# Patient Record
Sex: Male | Born: 1945
Health system: Southern US, Community
[De-identification: ages and names within clinical notes are randomized; demographics above are authoritative.]

## PROBLEM LIST (undated history)

## (undated) DIAGNOSIS — J449 Chronic obstructive pulmonary disease, unspecified: Secondary | ICD-10-CM

## (undated) DIAGNOSIS — G894 Chronic pain syndrome: Secondary | ICD-10-CM

## (undated) DIAGNOSIS — K219 Gastro-esophageal reflux disease without esophagitis: Secondary | ICD-10-CM

## (undated) DIAGNOSIS — I10 Essential (primary) hypertension: Secondary | ICD-10-CM

## (undated) DIAGNOSIS — E119 Type 2 diabetes mellitus without complications: Secondary | ICD-10-CM

## (undated) DIAGNOSIS — M1990 Unspecified osteoarthritis, unspecified site: Secondary | ICD-10-CM

## (undated) DIAGNOSIS — G4733 Obstructive sleep apnea (adult) (pediatric): Secondary | ICD-10-CM

## (undated) DIAGNOSIS — G629 Polyneuropathy, unspecified: Secondary | ICD-10-CM

## (undated) DIAGNOSIS — I700 Atherosclerosis of aorta: Secondary | ICD-10-CM

## (undated) DIAGNOSIS — I2510 Atherosclerotic heart disease of native coronary artery without angina pectoris: Secondary | ICD-10-CM

## (undated) DIAGNOSIS — F419 Anxiety disorder, unspecified: Secondary | ICD-10-CM

## (undated) DIAGNOSIS — F32A Depression, unspecified: Secondary | ICD-10-CM

## (undated) DIAGNOSIS — I444 Left anterior fascicular block: Secondary | ICD-10-CM

## (undated) DIAGNOSIS — K429 Umbilical hernia without obstruction or gangrene: Secondary | ICD-10-CM

## (undated) DIAGNOSIS — E785 Hyperlipidemia, unspecified: Secondary | ICD-10-CM

## (undated) DIAGNOSIS — K760 Fatty (change of) liver, not elsewhere classified: Secondary | ICD-10-CM

## (undated) DIAGNOSIS — I517 Cardiomegaly: Secondary | ICD-10-CM

## (undated) DIAGNOSIS — K449 Diaphragmatic hernia without obstruction or gangrene: Secondary | ICD-10-CM

## (undated) DIAGNOSIS — E1140 Type 2 diabetes mellitus with diabetic neuropathy, unspecified: Secondary | ICD-10-CM

## (undated) DIAGNOSIS — N183 Chronic kidney disease, stage 3 unspecified: Secondary | ICD-10-CM

## (undated) HISTORY — PX: HERNIA REPAIR: SHX51

## (undated) HISTORY — PX: CORONARY ANGIOPLASTY WITH STENT PLACEMENT: SHX49

## (undated) HISTORY — PX: APPENDECTOMY: SHX54

## (undated) HISTORY — PX: AMPUTATION TOE: SHX6595

---

## 2000-12-29 DIAGNOSIS — Z955 Presence of coronary angioplasty implant and graft: Secondary | ICD-10-CM

## 2000-12-29 HISTORY — DX: Presence of coronary angioplasty implant and graft: Z95.5

## 2017-11-16 DIAGNOSIS — R413 Other amnesia: Secondary | ICD-10-CM | POA: Insufficient documentation

## 2018-01-27 ENCOUNTER — Other Ambulatory Visit: Payer: Self-pay

## 2018-01-27 ENCOUNTER — Emergency Department
Admission: EM | Admit: 2018-01-27 | Discharge: 2018-01-27 | Disposition: A | Discharge status: Home / Self Care | Payer: Medicare HMO | Attending: Emergency Medicine | Admitting: Emergency Medicine

## 2018-01-27 DIAGNOSIS — Z760 Encounter for issue of repeat prescription: Secondary | ICD-10-CM

## 2018-01-27 DIAGNOSIS — I10 Essential (primary) hypertension: Secondary | ICD-10-CM | POA: Insufficient documentation

## 2018-01-27 DIAGNOSIS — G63 Polyneuropathy in diseases classified elsewhere: Secondary | ICD-10-CM | POA: Diagnosis not present

## 2018-01-27 DIAGNOSIS — E1143 Type 2 diabetes mellitus with diabetic autonomic (poly)neuropathy: Secondary | ICD-10-CM | POA: Insufficient documentation

## 2018-01-27 HISTORY — DX: Essential (primary) hypertension: I10

## 2018-01-27 HISTORY — DX: Polyneuropathy, unspecified: G62.9

## 2018-01-27 HISTORY — DX: Type 2 diabetes mellitus without complications: E11.9

## 2018-01-27 MED ORDER — OXYCODONE-ACETAMINOPHEN 5-325 MG PO TABS: 1.0000 | ORAL_TABLET | Freq: Four times a day (QID) | ORAL | 0 refills | Status: AC | PRN

## 2018-01-27 MED ORDER — GABAPENTIN 600 MG PO TABS: 1200.0000 mg | ORAL_TABLET | Freq: Three times a day (TID) | ORAL | 0 refills | Status: DC

## 2018-01-27 NOTE — ED Provider Notes (Signed)
[facility] Emergency Department Provider Note ____________________________________________  Time seen: 1205  I have reviewed the triage vital signs and the nursing notes.  HISTORY  Chief Complaint  Medication Refill  HPI Chris Mercer is a 72 y.o. male presents himself to the ED for a medicine refill request.  Patient with a history of diabetic neuropathy, presents with a request of a refill of his gabapentin.  Patient reports that he had been evaluated by Dr. Ginette Pitman, and is in the process of being referred to pain management. He was expecting a refill to be called in, but the pharmacy has no current prescriptions. He attempted to be seen this morning at [facility], but reportedly was asked to leave.   I have phoned the [facility] on [facility], they have no current prescriptions for him. The chart from [facility] notes that a prescription was to be called in for his gabapentin.  Past Medical History:  Diagnosis Date  . Diabetes mellitus without complication ([place])   . Hypertension   . Neuropathy     There are no active problems to display for this patient.   Past Surgical History:  Procedure Laterality Date  . APPENDECTOMY    . CORONARY ANGIOPLASTY WITH STENT PLACEMENT    . HERNIA REPAIR     x3    Prior to Admission medications   Medication Sig Start Date End Date Taking? Authorizing Provider  gabapentin (NEURONTIN) 600 MG tablet Take 2 tablets (1,200 mg total) by mouth 3 (three) times daily. 01/27/18 02/26/18  Theo Reither, Dannielle Karvonen, PA-C  oxyCODONE-acetaminophen (PERCOCET) 5-325 MG tablet Take 1 tablet by mouth every 6 (six) hours as needed for up to 7 days for severe pain. 01/27/18 02/03/18  Mandela Bello, Dannielle Karvonen, PA-C    Allergies Azithromycin  No family history on file.  Social History Social History   Tobacco Use  . Smoking status: Never Smoker  . Smokeless tobacco: Never Used  Substance Use Topics  . Alcohol use: No    Frequency:  Never  . Drug use: Not on file    Review of Systems  Constitutional: Negative for fever. Cardiovascular: Negative for chest pain. Respiratory: Negative for shortness of breath. Gastrointestinal: Negative for abdominal pain, vomiting and diarrhea. Genitourinary: Negative for dysuria. Musculoskeletal: Negative for back pain. Skin: Negative for rash. Neurological: Negative for headaches, focal weakness or numbness.  Chronic neuropathic pain as noted. ____________________________________________  PHYSICAL EXAM:  VITAL SIGNS: ED Triage Vitals  Enc Vitals Group     BP 01/27/18 1120 (!) 165/98     Pulse Rate 01/27/18 1120 90     Resp 01/27/18 1120 18     Temp 01/27/18 1120 97.6 F (36.4 C)     Temp Source 01/27/18 1120 Oral     SpO2 01/27/18 1120 100 %     Weight 01/27/18 1126 195 lb (88.5 kg)     Height 01/27/18 1126 6' (1.829 m)     Head Circumference --      Peak Flow --      Pain Score 01/27/18 1126 6     Pain Loc --      Pain Edu? --      Excl. in [place]? --     Constitutional: Alert and oriented. Well appearing and in no distress. Head: Normocephalic and atraumatic. Eyes: Conjunctivae are normal.  Normal extraocular movements Cardiovascular: Normal rate, regular rhythm. Normal distal pulses. Respiratory: Normal respiratory effort. No wheezes/rales/rhonchi. Musculoskeletal: Nontender with normal range of motion in  all extremities.  Neurologic:  Normal gait without ataxia. Normal speech and language. No gross focal neurologic deficits are appreciated. Skin:  Skin is warm, dry and intact. No rash noted. Psychiatric: Mood was despondent and affect was indignant. Patient exhibits appropriate insight and judgment. ____________________________________________  INITIAL IMPRESSION / ASSESSMENT AND PLAN / ED COURSE  Patient with ED evaluation and request for prescription refill.  The patient is in the process of being transferred to pain management versus neurology for management  and treatment of his chronic neuropathic pain.  He is provided with a prescription for gabapentin and a small prescription of Percocet here in the ED.  He is further advised to have an appointment on Monday with neurology for further management of his symptoms.  I also made contact on the patient's behalf, and with his permission with the hospital social worker.  She will make contact via phone with the patient after his discharge.  Patient requested some assistance with his current assisted living situation and potentially could benefit from some grief counseling.  I reviewed the patient's prescription history over the last 12 months in the multi-state controlled substances database(s) that includes [place], [place], [place], [place], [place], [place], [place], [place], [place], [place], [place], [place], [place], and [place].  Results were notable for no current narcotic prescriptions.  ____________________________________________  FINAL CLINICAL IMPRESSION(S) / ED DIAGNOSES  Final diagnoses:  Medication refill  Polyneuropathy associated with underlying disease [facility])      Carmie End, Dannielle Karvonen, PA-C 01/27/18 1910    Orbie Pyo, MD 01/29/18 [phone]

## 2018-01-27 NOTE — Discharge Instructions (Signed)
You have been provided a refill of your home maintenance medicines. You must follow-up with neurology for ongoing management.

## 2018-01-27 NOTE — ED Triage Notes (Signed)
Pt states he needs a refill on his gabapentin for neuropathy pain and has the empty bottle with him that says he has 3 refills remaining but [facility] [place] and states the [facility] here will not honor it.Marland Kitchen

## 2018-01-27 NOTE — ED Notes (Signed)
Pt is here for a prescription of Gabapentin. Pt states he was given a RX for cymbalta to which he is allergic too. Pt states Dr. Alvy Bimler will not return his calls and only speaks to his son. Pt is interested in getting RX for gabapentin 600 mg. Pt is ambulatory, NAD and awaiting EDP at this time.

## 2018-01-27 NOTE — ED Notes (Signed)
First Nurse Note:  Patient states that he is here for pain management.  Unable to have pain medication refilled at physician office.

## 2018-02-02 DIAGNOSIS — R292 Abnormal reflex: Secondary | ICD-10-CM | POA: Insufficient documentation

## 2018-03-09 DIAGNOSIS — R278 Other lack of coordination: Secondary | ICD-10-CM | POA: Insufficient documentation

## 2018-04-29 DIAGNOSIS — Z0000 Encounter for general adult medical examination without abnormal findings: Secondary | ICD-10-CM | POA: Insufficient documentation

## 2019-02-15 ENCOUNTER — Other Ambulatory Visit: Payer: Self-pay | Admitting: Nephrology

## 2019-02-15 DIAGNOSIS — N179 Acute kidney failure, unspecified: Secondary | ICD-10-CM

## 2019-02-15 DIAGNOSIS — N189 Chronic kidney disease, unspecified: Secondary | ICD-10-CM

## 2019-02-21 ENCOUNTER — Ambulatory Visit: Payer: Medicare HMO

## 2019-02-24 ENCOUNTER — Ambulatory Visit: Payer: Self-pay

## 2019-03-16 ENCOUNTER — Encounter: Payer: Self-pay | Admitting: Urology

## 2019-03-16 ENCOUNTER — Ambulatory Visit: Payer: Self-pay | Admitting: Urology

## 2019-03-18 ENCOUNTER — Encounter (HOSPITAL_COMMUNITY): Payer: Self-pay | Admitting: Internal Medicine

## 2019-03-18 ENCOUNTER — Emergency Department (HOSPITAL_COMMUNITY): Payer: Medicare HMO

## 2019-03-18 ENCOUNTER — Inpatient Hospital Stay (HOSPITAL_COMMUNITY)
Admission: EM | Admit: 2019-03-18 | Discharge: 2019-03-23 | DRG: 092 | Disposition: A | Discharge status: Home Health | Payer: Medicare HMO | Attending: Internal Medicine | Admitting: Internal Medicine

## 2019-03-18 ENCOUNTER — Other Ambulatory Visit: Payer: Self-pay

## 2019-03-18 DIAGNOSIS — Z888 Allergy status to other drugs, medicaments and biological substances status: Secondary | ICD-10-CM

## 2019-03-18 DIAGNOSIS — G92 Toxic encephalopathy: Secondary | ICD-10-CM | POA: Diagnosis not present

## 2019-03-18 DIAGNOSIS — W1830XA Fall on same level, unspecified, initial encounter: Secondary | ICD-10-CM | POA: Diagnosis present

## 2019-03-18 DIAGNOSIS — R531 Weakness: Secondary | ICD-10-CM

## 2019-03-18 DIAGNOSIS — E119 Type 2 diabetes mellitus without complications: Secondary | ICD-10-CM

## 2019-03-18 DIAGNOSIS — E860 Dehydration: Secondary | ICD-10-CM | POA: Diagnosis present

## 2019-03-18 DIAGNOSIS — E872 Acidosis: Secondary | ICD-10-CM | POA: Diagnosis present

## 2019-03-18 DIAGNOSIS — T424X5A Adverse effect of benzodiazepines, initial encounter: Secondary | ICD-10-CM | POA: Diagnosis present

## 2019-03-18 DIAGNOSIS — Z781 Physical restraint status: Secondary | ICD-10-CM

## 2019-03-18 DIAGNOSIS — F418 Other specified anxiety disorders: Secondary | ICD-10-CM | POA: Diagnosis present

## 2019-03-18 DIAGNOSIS — R6889 Other general symptoms and signs: Secondary | ICD-10-CM | POA: Diagnosis present

## 2019-03-18 DIAGNOSIS — R339 Retention of urine, unspecified: Secondary | ICD-10-CM | POA: Diagnosis not present

## 2019-03-18 DIAGNOSIS — T50905A Adverse effect of unspecified drugs, medicaments and biological substances, initial encounter: Secondary | ICD-10-CM | POA: Diagnosis present

## 2019-03-18 DIAGNOSIS — R4182 Altered mental status, unspecified: Secondary | ICD-10-CM | POA: Diagnosis present

## 2019-03-18 DIAGNOSIS — F19921 Other psychoactive substance use, unspecified with intoxication with delirium: Secondary | ICD-10-CM

## 2019-03-18 DIAGNOSIS — F32A Depression, unspecified: Secondary | ICD-10-CM | POA: Diagnosis present

## 2019-03-18 DIAGNOSIS — F22 Delusional disorders: Secondary | ICD-10-CM | POA: Diagnosis present

## 2019-03-18 DIAGNOSIS — N179 Acute kidney failure, unspecified: Secondary | ICD-10-CM | POA: Diagnosis present

## 2019-03-18 DIAGNOSIS — G629 Polyneuropathy, unspecified: Secondary | ICD-10-CM

## 2019-03-18 DIAGNOSIS — E1140 Type 2 diabetes mellitus with diabetic neuropathy, unspecified: Secondary | ICD-10-CM | POA: Diagnosis present

## 2019-03-18 DIAGNOSIS — F419 Anxiety disorder, unspecified: Secondary | ICD-10-CM | POA: Diagnosis present

## 2019-03-18 DIAGNOSIS — Z8249 Family history of ischemic heart disease and other diseases of the circulatory system: Secondary | ICD-10-CM

## 2019-03-18 DIAGNOSIS — S80212A Abrasion, left knee, initial encounter: Secondary | ICD-10-CM | POA: Diagnosis present

## 2019-03-18 DIAGNOSIS — S80211A Abrasion, right knee, initial encounter: Secondary | ICD-10-CM | POA: Diagnosis present

## 2019-03-18 DIAGNOSIS — Z881 Allergy status to other antibiotic agents status: Secondary | ICD-10-CM

## 2019-03-18 DIAGNOSIS — E785 Hyperlipidemia, unspecified: Secondary | ICD-10-CM | POA: Diagnosis present

## 2019-03-18 DIAGNOSIS — I10 Essential (primary) hypertension: Secondary | ICD-10-CM

## 2019-03-18 DIAGNOSIS — I152 Hypertension secondary to endocrine disorders: Secondary | ICD-10-CM | POA: Diagnosis present

## 2019-03-18 DIAGNOSIS — L8990 Pressure ulcer of unspecified site, unspecified stage: Secondary | ICD-10-CM

## 2019-03-18 DIAGNOSIS — G894 Chronic pain syndrome: Secondary | ICD-10-CM | POA: Diagnosis present

## 2019-03-18 DIAGNOSIS — R404 Transient alteration of awareness: Secondary | ICD-10-CM

## 2019-03-18 DIAGNOSIS — Z955 Presence of coronary angioplasty implant and graft: Secondary | ICD-10-CM

## 2019-03-18 DIAGNOSIS — F329 Major depressive disorder, single episode, unspecified: Secondary | ICD-10-CM | POA: Diagnosis present

## 2019-03-18 LAB — URINALYSIS, ROUTINE W REFLEX MICROSCOPIC
Bilirubin Urine: NEGATIVE
Glucose, UA: NEGATIVE mg/dL
Hgb urine dipstick: NEGATIVE
KETONES UR: NEGATIVE mg/dL
Leukocytes,Ua: NEGATIVE
NITRITE: NEGATIVE
PROTEIN: NEGATIVE mg/dL
Specific Gravity, Urine: 1.024 (ref 1.005–1.030)
pH: 5 (ref 5.0–8.0)

## 2019-03-18 LAB — COMPREHENSIVE METABOLIC PANEL
ALBUMIN: 3.3 g/dL — AB (ref 3.5–5.0)
ALT: 26 U/L (ref 0–44)
AST: 37 U/L (ref 15–41)
Alkaline Phosphatase: 102 U/L (ref 38–126)
Anion gap: 7 (ref 5–15)
BUN: 26 mg/dL — ABNORMAL HIGH (ref 8–23)
CALCIUM: 9 mg/dL (ref 8.9–10.3)
CO2: 21 mmol/L — ABNORMAL LOW (ref 22–32)
Chloride: 112 mmol/L — ABNORMAL HIGH (ref 98–111)
Creatinine, Ser: 1.3 mg/dL — ABNORMAL HIGH (ref 0.61–1.24)
GFR calc non Af Amer: 55 mL/min — ABNORMAL LOW (ref >60)
Glucose, Bld: 186 mg/dL — ABNORMAL HIGH (ref 70–99)
POTASSIUM: 3.6 mmol/L (ref 3.5–5.1)
SODIUM: 140 mmol/L (ref 135–145)
Total Bilirubin: 0.2 mg/dL — ABNORMAL LOW (ref 0.3–1.2)
Total Protein: 5.9 g/dL — ABNORMAL LOW (ref 6.5–8.1)

## 2019-03-18 LAB — INFLUENZA PANEL BY PCR (TYPE A & B)
INFLAPCR: NEGATIVE
Influenza B By PCR: NEGATIVE

## 2019-03-18 LAB — CBC WITH DIFFERENTIAL/PLATELET
ABS IMMATURE GRANULOCYTES: 0.05 10*3/uL (ref 0.00–0.07)
BASOS ABS: 0 10*3/uL (ref 0.0–0.1)
BASOS PCT: 0 %
EOS ABS: 0.3 10*3/uL (ref 0.0–0.5)
Eosinophils Relative: 3 %
HCT: 35.3 % — ABNORMAL LOW (ref 39.0–52.0)
Hemoglobin: 10.9 g/dL — ABNORMAL LOW (ref 13.0–17.0)
Immature Granulocytes: 1 %
LYMPHS ABS: 1.3 10*3/uL (ref 0.7–4.0)
Lymphocytes Relative: 15 %
MCH: 27.9 pg (ref 26.0–34.0)
MCHC: 30.9 g/dL (ref 30.0–36.0)
MCV: 90.5 fL (ref 80.0–100.0)
Monocytes Absolute: 0.6 10*3/uL (ref 0.1–1.0)
Monocytes Relative: 6 %
NEUTROS ABS: 6.6 10*3/uL (ref 1.7–7.7)
NEUTROS PCT: 75 %
NRBC: 0 % (ref 0.0–0.2)
PLATELETS: 197 10*3/uL (ref 150–400)
RBC: 3.9 MIL/uL — ABNORMAL LOW (ref 4.22–5.81)
RDW: 14.5 % (ref 11.5–15.5)
WBC: 8.8 10*3/uL (ref 4.0–10.5)

## 2019-03-18 LAB — ETHANOL

## 2019-03-18 LAB — LACTIC ACID, PLASMA
LACTIC ACID, VENOUS: 2.6 mmol/L — AB (ref 0.5–1.9)
LACTIC ACID, VENOUS: 1.6 mmol/L (ref 0.5–1.9)

## 2019-03-18 LAB — TSH: TSH: 2.244 u[IU]/mL (ref 0.350–4.500)

## 2019-03-18 MED ORDER — SODIUM CHLORIDE 0.9 % IV BOLUS
500.0000 mL | Freq: Once | INTRAVENOUS | Status: AC
  Administered 2019-03-18: 500 mL via INTRAVENOUS

## 2019-03-18 MED ORDER — ACETAMINOPHEN 650 MG RE SUPP: 650.0000 mg | Freq: Four times a day (QID) | RECTAL | Status: DC | PRN

## 2019-03-18 MED ORDER — ACETAMINOPHEN 500 MG PO TABS
1000.0000 mg | ORAL_TABLET | Freq: Once | ORAL | Status: AC
  Administered 2019-03-18: 1000 mg via ORAL
  Filled 2019-03-18: qty 2

## 2019-03-18 MED ORDER — PRAVASTATIN SODIUM 40 MG PO TABS
40.0000 mg | ORAL_TABLET | Freq: Every day | ORAL | Status: DC
  Administered 2019-03-19 – 2019-03-22 (×4): 40 mg via ORAL
  Filled 2019-03-18 (×5): qty 1

## 2019-03-18 MED ORDER — PANTOPRAZOLE SODIUM 40 MG PO TBEC
40.0000 mg | DELAYED_RELEASE_TABLET | Freq: Every day | ORAL | Status: DC
  Administered 2019-03-19 – 2019-03-23 (×6): 40 mg via ORAL
  Filled 2019-03-18 (×6): qty 1

## 2019-03-18 MED ORDER — ACETAMINOPHEN 325 MG PO TABS
650.0000 mg | ORAL_TABLET | Freq: Four times a day (QID) | ORAL | Status: DC | PRN
  Administered 2019-03-19 – 2019-03-22 (×9): 650 mg via ORAL
  Filled 2019-03-18 (×10): qty 2

## 2019-03-18 MED ORDER — LISINOPRIL 40 MG PO TABS
40.0000 mg | ORAL_TABLET | Freq: Every day | ORAL | Status: DC
  Administered 2019-03-19 – 2019-03-23 (×6): 40 mg via ORAL
  Filled 2019-03-18 (×6): qty 1

## 2019-03-18 MED ORDER — PRAZOSIN HCL 2 MG PO CAPS
2.0000 mg | ORAL_CAPSULE | Freq: Every day | ORAL | Status: DC
  Administered 2019-03-20 – 2019-03-22 (×3): 2 mg via ORAL
  Filled 2019-03-18 (×6): qty 1

## 2019-03-18 MED ORDER — ENOXAPARIN SODIUM 40 MG/0.4ML ~~LOC~~ SOLN
40.0000 mg | SUBCUTANEOUS | Status: DC
  Administered 2019-03-20 – 2019-03-23 (×4): 40 mg via SUBCUTANEOUS
  Filled 2019-03-18 (×5): qty 0.4

## 2019-03-18 NOTE — ED Notes (Signed)
Pt up sitting on the end of the bed, trying to put his shirt on. This RN redirected patient back to bed and hooked him back up to the monitor. Pt sts he "will walk out," and "will not stay in the hospital."

## 2019-03-18 NOTE — ED Notes (Signed)
This RN attempting to help patient call his son. Patient raises his voice and yells at [place], "You better make it damn fast!" and forcefully kicks room door shut.
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
## 2019-03-18 NOTE — Progress Notes (Addendum)
Received from ER via stretcher to room 2W38. Assisted to bed and positioned for comfort. Oriented to room, bed and unit.
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
## 2019-03-18 NOTE — H&P (Signed)
History and Physical    Chris Mercer EHM:[id number] DOB: 1946-05-31 DOA: 03/18/2019  PCP: Tracie Harrier, MD  Patient coming from: [place] by police  I have personally briefly reviewed patient's old medical records in [place]  Chief Complaint: Altered mental status  HPI: Chris Mercer is a 73 y.o. male with medical history significant for type 2 diabetes, hypertension, hyperlipidemia, depression/anxiety, sensory neuropathy, and chronic pain syndrome who was brought to the ED by police after driving erratically.  Patient is providing partial history, the remainder of history is obtained from EDP and chart review.  Patient states he woke up this morning on the floor with knee abrasions and feeling confused.  He cannot recall what happened prior to waking up and does not remember falling.  He is on pain and anxiety medications and believes he has been taking his appropriately as prescribed but also admits it is possible he may have taken his medications incorrectly.   He denies any alcohol use, illicit drug use.  Per ED documentation there was report of 3 weeks of progressive flulike illness symptoms and he was seen by his PCP the day prior to admission and tested negative for flu and respiratory virus panel.  His PCP called him to return for COVID testing but he did not return.  During my examination, he denies any myalgias, seizure-like activity, bowel/bladder incontinence, chest pain, palpitations, shortness of breath, fevers, chills, diaphoresis, cough, new focal weakness, or sensory change apart from his chronic lower extremity sensory neuropathy.   ED Course:  Initial vitals in the ED showed BP 156/80, pulse 86, RR 18, temp 98.3 Fahrenheit, SPO2 96% on room air.  Labs were notable for WBC 8.8, hemoglobin 10.9, platelets 197, sodium 140, potassium 3.6, BUN 26, creatinine 1.3, serum glucose 186, AST 37, ALT 26, alk phos 102, total bilirubin 0.2, TSH 2.244, ethanol <10, urinalysis  negative for UTI, UDS collected and pending, lactic acid 2.6, influenza panel negative.  Blood cultures were collected. He was given 1 L of IV fluids with improvement in lactic acid to 1.6.  CT head without contrast showed mild atrophic changes without acute abnormality.  X-rays of both knees were without acute osseous abnormality.  Portable chest x-ray showed enlarged cardiac silhouette without focal airspace disease.    Per EDP, patient became agitated and wanted to leave [facility] however was de-escalated by the ED physician, Dr. Laverta Baltimore.  Preliminary IVC paperwork was filled out by Dr. Laverta Baltimore, as it was felt patient did not have capacity to make decision to leave AMA at that time.  The hospitalist service was consulted to admit for further evaluation and management.   Review of Systems: As per HPI otherwise 10 point review of systems negative.    Past Medical History:  Diagnosis Date  . Diabetes mellitus without complication ([place])   . Hypertension   . Neuropathy     Past Surgical History:  Procedure Laterality Date  . APPENDECTOMY    . CORONARY ANGIOPLASTY WITH STENT PLACEMENT    . HERNIA REPAIR     x3     reports that he has never smoked. He has never used smokeless tobacco. He reports that he does not drink alcohol. No history on file for drug.  Allergies  Allergen Reactions  . Azithromycin Anaphylaxis, Swelling and Hives  . Fenoprofen Anaphylaxis  . Duloxetine Diarrhea    With dark stools       Family History  Problem Relation Age of Onset  . Heart  disease Father      Prior to Admission medications   Medication Sig Start Date End Date Taking? Authorizing Provider  ALPRAZolam Duanne Moron) 0.5 MG tablet Take 0.25-0.5 mg by mouth 2 (two) times daily as needed for anxiety.   Yes [provider]  Buprenorphine HCl (BELBUCA) 300 MCG FILM Place 300 mg inside cheek every 12 (twelve) hours.   Yes [provider]  lisinopril (PRINIVIL,ZESTRIL) 40 MG  tablet Take 40 mg by mouth daily.   Yes [provider]  pravastatin (PRAVACHOL) 40 MG tablet Take 40 mg by mouth at bedtime.   Yes [provider]  ARIPiprazole (ABILIFY) 5 MG tablet Take 5 mg by mouth daily. 02/28/19   [provider]  busPIRone (BUSPAR) 15 MG tablet Take 15 mg by mouth 2 (two) times daily. 02/25/19   [provider]  gabapentin (NEURONTIN) 600 MG tablet Take 2 tablets (1,200 mg total) by mouth 3 (three) times daily. Patient taking differently: Take 600 mg by mouth 3 (three) times daily.  01/27/18 03/18/19  Menshew, Dannielle Karvonen, PA-C  pantoprazole (PROTONIX) 40 MG tablet Take 40 mg by mouth daily. FOR 14 DAYS 02/03/19   [provider]  prazosin (MINIPRESS) 2 MG capsule Take 2 mg by mouth at bedtime. 02/25/19   [provider]    Physical Exam: Vitals:   03/18/19 1749 03/18/19 1800 03/18/19 1958 03/18/19 2340  BP: (!) 156/80  (!) 141/82 (!) 157/94  Pulse: 86  67 63  Resp: 18  16   Temp: 98.3 F (36.8 C)     TempSrc: Oral     SpO2: 94%  96% 100%  Weight:  99.8 kg    Height:  '6\' 1"'  (1.854 m)      Constitutional: Resting in bed, somewhat anxious, depressed Eyes: PERRL, EOMI, lids and conjunctivae normal ENMT: Mucous membranes are dry. Posterior pharynx clear of any exudate or lesions.Normal dentition.  Neck: normal, supple, no masses. Respiratory: clear to auscultation bilaterally, no wheezing, no crackles. Normal respiratory effort. No accessory muscle use.  Cardiovascular: Regular rate and rhythm, no murmurs / rubs / gallops. No extremity edema.  Abdomen: no tenderness, no masses palpated. No hepatosplenomegaly. Bowel sounds positive.  Musculoskeletal: no clubbing / cyanosis. No joint deformity upper and lower extremities. Good ROM, no contractures. Normal muscle tone.  Skin: Abrasions both knees. Neurologic: CN 2-12 grossly intact. Sensation intact upper extremities, decreased sensation lower extremities chronic per  patient. Strength 5/5 in all 4.  No dysmetria. Psychiatric: Alert and oriented to self, city, year.  Labile mood, anxious and depressed.    Labs on Admission: I have personally reviewed following labs and imaging studies  CBC: Recent Labs  Lab 03/18/19 1749  WBC 8.8  NEUTROABS 6.6  HGB 10.9*  HCT 35.3*  MCV 90.5  PLT 259   Basic Metabolic Panel: Recent Labs  Lab 03/18/19 1749  NA 140  K 3.6  CL 112*  CO2 21*  GLUCOSE 186*  BUN 26*  CREATININE 1.30*  CALCIUM 9.0   GFR: Estimated Creatinine Clearance: 64.8 mL/min (A) (by C-G formula based on SCr of 1.3 mg/dL (H)). Liver Function Tests: Recent Labs  Lab 03/18/19 1749  AST 37  ALT 26  ALKPHOS 102  BILITOT 0.2*  PROT 5.9*  ALBUMIN 3.3*   No results for input(s): LIPASE, AMYLASE in the last 168 hours. No results for input(s): AMMONIA in the last 168 hours. Coagulation Profile: No results for input(s): INR, PROTIME in the last 168  hours. Cardiac Enzymes: No results for input(s): CKTOTAL, CKMB, CKMBINDEX, TROPONINI in the last 168 hours. BNP (last 3 results) No results for input(s): PROBNP in the last 8760 hours. HbA1C: No results for input(s): HGBA1C in the last 72 hours. CBG: No results for input(s): GLUCAP in the last 168 hours. Lipid Profile: No results for input(s): CHOL, HDL, LDLCALC, TRIG, CHOLHDL, LDLDIRECT in the last 72 hours. Thyroid Function Tests: Recent Labs    03/18/19 1750  TSH 2.244   Anemia Panel: No results for input(s): VITAMINB12, FOLATE, FERRITIN, TIBC, IRON, RETICCTPCT in the last 72 hours. Urine analysis:    Component Value Date/Time   COLORURINE YELLOW 03/18/2019 1955   APPEARANCEUR HAZY (A) 03/18/2019 1955   LABSPEC 1.024 03/18/2019 1955   PHURINE 5.0 03/18/2019 1955   GLUCOSEU NEGATIVE 03/18/2019 1955   HGBUR NEGATIVE 03/18/2019 1955   BILIRUBINUR NEGATIVE 03/18/2019 [place] NEGATIVE 03/18/2019 1955   PROTEINUR NEGATIVE 03/18/2019 1955   NITRITE NEGATIVE  03/18/2019 1955   LEUKOCYTESUR NEGATIVE 03/18/2019 1955    Radiological Exams on Admission: Ct Head Wo Contrast  Result Date: 03/18/2019 CLINICAL DATA:  Altered level of consciousness. EXAM: CT HEAD WITHOUT CONTRAST TECHNIQUE: Contiguous axial images were obtained from the base of the skull through the vertex without intravenous contrast. COMPARISON:  None. FINDINGS: Brain: Mild atrophic changes are noted. No findings to suggest acute hemorrhage, acute infarction or space-occupying mass lesion are seen. Vascular: No hyperdense vessel or unexpected calcification. Skull: Normal. Negative for fracture or focal lesion. Sinuses/Orbits: No acute finding. Other: None. IMPRESSION: Mild atrophic changes without acute abnormality. Electronically Signed   By: Inez Catalina M.D.   On: 03/18/2019 19:25   Dg Chest Portable 1 View  Result Date: 03/18/2019 CLINICAL DATA:  Difficulty breathing, cough EXAM: PORTABLE CHEST 1 VIEW COMPARISON:  None. FINDINGS: No focal airspace disease. Mild cardiomegaly. No pleural effusion. No pneumothorax. IMPRESSION: Cardiomegaly.  No focal airspace disease. Electronically Signed   By: Donavan Foil M.D.   On: 03/18/2019 19:19   Dg Knee Left Port  Result Date: 03/18/2019 CLINICAL DATA:  Fall EXAM: PORTABLE LEFT KNEE - 1-2 VIEW COMPARISON:  None. FINDINGS: No fracture or malalignment. No significant knee effusion. Minimal patellofemoral degenerative change IMPRESSION: No acute osseous abnormality Electronically Signed   By: Donavan Foil M.D.   On: 03/18/2019 19:19   Dg Knee Right Port  Result Date: 03/18/2019 CLINICAL DATA:  Fall EXAM: PORTABLE RIGHT KNEE - 1-2 VIEW COMPARISON:  None. FINDINGS: No fracture or malalignment. No significant knee effusion. Mild patellofemoral, lateral and medial joint space degenerative change IMPRESSION: No acute osseous abnormality Electronically Signed   By: Donavan Foil M.D.   On: 03/18/2019 19:20    EKG: Independently reviewed.  Sinus rhythm,  LAFB, no acute ischemic changes.  Assessment/Plan Principal Problem:   Altered mental status Active Problems:   Neuropathy   Hypertension associated with diabetes ([place])   Type 2 diabetes mellitus ([facility])   Chronic pain syndrome  Chris Mercer is a 73 y.o. male with medical history significant for type 2 diabetes, hypertension, hyperlipidemia, depression/anxiety, sensory neuropathy, and chronic pain syndrome who is admitted for altered mental status/behavioral change.   Altered mental status/behavioral change: Patient reports waking up on the floor morning of admission without recall of prior events.  High suspicion for medication adverse event, currently prescribed Xanax, Abilify, buprenorphine, BuSpar, gabapentin.  Also report of being started on Vilazodone (SSRI) as documented by his Pain Management specialist office note 01/04/2019.  He  has no obvious infection and no symptoms of respiratory or viral illness to support COVID testing at this time. -Hold home Xanax, Abilify, buprenorphine, BuSpar, gabapentin for now -UDS ordered and pending -D/C Covid-19 testing and precaution -If mental status/mood without improvement, would recommend psychiatric evaluation -Currently IVC I have personally assessed this patient and believe they lack medical decision-making capacity based on the following criteria: 1. He is unable to understand his medical problems, proposed treatment, and alternatives to proposed treatments (if any).  2. He is unable to understand reasonably foreseeable consequences of his decision to accept or refuse proposed treatment (including withholding or withdrawing treatment). 3. He is unable to show reasoning in making this decision.  4. His decision does appear to be affected by depression or delusion/psychosis.  The patient appears to lack medical decision-making capacity at this time. I am concerned that the patient's decision will cause unintended and irreparable harm.   Type  2 diabetes: Not currently on medical therapy.  Serum glucose 186 on admission. -Sensitive SSI while admitted  Hypertension: BP mildly elevated on admission. -Continue home prazosin and lisinopril  Chronic pain syndrome/neuropathy: Holding home buprenorphine and gabapentin as above.  Depression/anxiety: He has depressed and anxious mood on admission.  For now, holding home Xanax, Abilify, BuSpar as above due to suspected adverse medication event.   DVT prophylaxis: Lovenox Code Status: Full code Family Communication: None present at bedside on admission Disposition Plan: Pending improvement in mental status Consults called: None Admission status: Observation   Zada Finders MD [facility] Pager [phone]  If 7PM-7AM, please contact night-coverage [email or web address]  03/19/2019, 1:03 AM

## 2019-03-19 ENCOUNTER — Observation Stay (HOSPITAL_COMMUNITY): Payer: Medicare HMO

## 2019-03-19 DIAGNOSIS — R6889 Other general symptoms and signs: Secondary | ICD-10-CM

## 2019-03-19 DIAGNOSIS — E872 Acidosis: Secondary | ICD-10-CM | POA: Diagnosis present

## 2019-03-19 DIAGNOSIS — R4182 Altered mental status, unspecified: Secondary | ICD-10-CM | POA: Diagnosis present

## 2019-03-19 DIAGNOSIS — Z8249 Family history of ischemic heart disease and other diseases of the circulatory system: Secondary | ICD-10-CM | POA: Diagnosis not present

## 2019-03-19 DIAGNOSIS — R404 Transient alteration of awareness: Secondary | ICD-10-CM

## 2019-03-19 DIAGNOSIS — E1140 Type 2 diabetes mellitus with diabetic neuropathy, unspecified: Secondary | ICD-10-CM | POA: Diagnosis present

## 2019-03-19 DIAGNOSIS — Z781 Physical restraint status: Secondary | ICD-10-CM | POA: Diagnosis not present

## 2019-03-19 DIAGNOSIS — I152 Hypertension secondary to endocrine disorders: Secondary | ICD-10-CM | POA: Diagnosis present

## 2019-03-19 DIAGNOSIS — T424X5A Adverse effect of benzodiazepines, initial encounter: Secondary | ICD-10-CM | POA: Diagnosis present

## 2019-03-19 DIAGNOSIS — Z888 Allergy status to other drugs, medicaments and biological substances status: Secondary | ICD-10-CM | POA: Diagnosis not present

## 2019-03-19 DIAGNOSIS — E785 Hyperlipidemia, unspecified: Secondary | ICD-10-CM | POA: Diagnosis present

## 2019-03-19 DIAGNOSIS — F418 Other specified anxiety disorders: Secondary | ICD-10-CM | POA: Diagnosis present

## 2019-03-19 DIAGNOSIS — G629 Polyneuropathy, unspecified: Secondary | ICD-10-CM

## 2019-03-19 DIAGNOSIS — E1169 Type 2 diabetes mellitus with other specified complication: Secondary | ICD-10-CM

## 2019-03-19 DIAGNOSIS — G92 Toxic encephalopathy: Secondary | ICD-10-CM | POA: Diagnosis present

## 2019-03-19 DIAGNOSIS — R339 Retention of urine, unspecified: Secondary | ICD-10-CM | POA: Diagnosis not present

## 2019-03-19 DIAGNOSIS — R531 Weakness: Secondary | ICD-10-CM

## 2019-03-19 DIAGNOSIS — S80212A Abrasion, left knee, initial encounter: Secondary | ICD-10-CM | POA: Diagnosis present

## 2019-03-19 DIAGNOSIS — S80211A Abrasion, right knee, initial encounter: Secondary | ICD-10-CM | POA: Diagnosis present

## 2019-03-19 DIAGNOSIS — Z955 Presence of coronary angioplasty implant and graft: Secondary | ICD-10-CM | POA: Diagnosis not present

## 2019-03-19 DIAGNOSIS — E1159 Type 2 diabetes mellitus with other circulatory complications: Secondary | ICD-10-CM

## 2019-03-19 DIAGNOSIS — G894 Chronic pain syndrome: Secondary | ICD-10-CM

## 2019-03-19 DIAGNOSIS — I10 Essential (primary) hypertension: Secondary | ICD-10-CM

## 2019-03-19 DIAGNOSIS — E860 Dehydration: Secondary | ICD-10-CM | POA: Diagnosis present

## 2019-03-19 DIAGNOSIS — W1830XA Fall on same level, unspecified, initial encounter: Secondary | ICD-10-CM | POA: Diagnosis present

## 2019-03-19 DIAGNOSIS — F22 Delusional disorders: Secondary | ICD-10-CM | POA: Diagnosis present

## 2019-03-19 DIAGNOSIS — Z881 Allergy status to other antibiotic agents status: Secondary | ICD-10-CM | POA: Diagnosis not present

## 2019-03-19 DIAGNOSIS — N179 Acute kidney failure, unspecified: Secondary | ICD-10-CM | POA: Diagnosis present

## 2019-03-19 LAB — RAPID URINE DRUG SCREEN, HOSP PERFORMED
Amphetamines: NOT DETECTED
BENZODIAZEPINES: POSITIVE — AB
Barbiturates: NOT DETECTED
COCAINE: NOT DETECTED
OPIATES: NOT DETECTED
TETRAHYDROCANNABINOL: NOT DETECTED

## 2019-03-19 LAB — CBC
HEMATOCRIT: 33.1 % — AB (ref 39.0–52.0)
Hemoglobin: 10.2 g/dL — ABNORMAL LOW (ref 13.0–17.0)
MCH: 27.6 pg (ref 26.0–34.0)
MCHC: 30.8 g/dL (ref 30.0–36.0)
MCV: 89.7 fL (ref 80.0–100.0)
Platelets: 192 10*3/uL (ref 150–400)
RBC: 3.69 MIL/uL — ABNORMAL LOW (ref 4.22–5.81)
RDW: 14.2 % (ref 11.5–15.5)
WBC: 6 10*3/uL (ref 4.0–10.5)
nRBC: 0 % (ref 0.0–0.2)

## 2019-03-19 LAB — BASIC METABOLIC PANEL
Anion gap: 8 (ref 5–15)
BUN: 25 mg/dL — ABNORMAL HIGH (ref 8–23)
CO2: 24 mmol/L (ref 22–32)
Calcium: 8.9 mg/dL (ref 8.9–10.3)
Chloride: 111 mmol/L (ref 98–111)
Creatinine, Ser: 1.16 mg/dL (ref 0.61–1.24)
GFR calc Af Amer: >60 mL/min (ref >60)
GFR calc non Af Amer: >60 mL/min (ref >60)
GLUCOSE: 96 mg/dL (ref 70–99)
Potassium: 3.6 mmol/L (ref 3.5–5.1)
Sodium: 143 mmol/L (ref 135–145)

## 2019-03-19 LAB — GLUCOSE, CAPILLARY
Glucose-Capillary: 115 mg/dL — ABNORMAL HIGH (ref 70–99)
Glucose-Capillary: 142 mg/dL — ABNORMAL HIGH (ref 70–99)
Glucose-Capillary: 91 mg/dL (ref 70–99)
Glucose-Capillary: 127 mg/dL — ABNORMAL HIGH (ref 70–99)

## 2019-03-19 MED ORDER — ZIPRASIDONE MESYLATE 20 MG IM SOLR: 10.0000 mg | Freq: Four times a day (QID) | INTRAMUSCULAR | Status: DC | PRN

## 2019-03-19 MED ORDER — INSULIN ASPART 100 UNIT/ML ~~LOC~~ SOLN
0.0000 [IU] | Freq: Three times a day (TID) | SUBCUTANEOUS | Status: DC
  Administered 2019-03-19 – 2019-03-21 (×2): 1 [IU] via SUBCUTANEOUS

## 2019-03-19 MED ORDER — ALPRAZOLAM 0.25 MG PO TABS
0.2500 mg | ORAL_TABLET | Freq: Two times a day (BID) | ORAL | Status: DC | PRN
  Administered 2019-03-19 – 2019-03-21 (×3): 0.5 mg via ORAL
  Filled 2019-03-19 (×4): qty 2

## 2019-03-19 MED ORDER — BUSPIRONE HCL 5 MG PO TABS: 15.0000 mg | ORAL_TABLET | Freq: Two times a day (BID) | ORAL | Status: DC

## 2019-03-19 MED ORDER — BUSPIRONE HCL 5 MG PO TABS
7.5000 mg | ORAL_TABLET | Freq: Two times a day (BID) | ORAL | Status: DC
  Administered 2019-03-20 – 2019-03-23 (×7): 7.5 mg via ORAL
  Filled 2019-03-19 (×8): qty 2

## 2019-03-19 MED ORDER — ARIPIPRAZOLE 5 MG PO TABS: 5.0000 mg | ORAL_TABLET | Freq: Every day | ORAL | Status: DC

## 2019-03-19 MED ORDER — ZIPRASIDONE MESYLATE 20 MG IM SOLR: 5.0000 mg | Freq: Four times a day (QID) | INTRAMUSCULAR | Status: DC | PRN

## 2019-03-19 MED ORDER — HALOPERIDOL LACTATE 5 MG/ML IJ SOLN
1.0000 mg | Freq: Four times a day (QID) | INTRAMUSCULAR | Status: DC | PRN
  Administered 2019-03-19: 1 mg via INTRAVENOUS
  Filled 2019-03-19 (×2): qty 1

## 2019-03-19 MED ORDER — SODIUM CHLORIDE 0.9 % IV SOLN
INTRAVENOUS | Status: DC
  Administered 2019-03-19 – 2019-03-21 (×2): via INTRAVENOUS

## 2019-03-19 MED ORDER — LORAZEPAM 2 MG/ML IJ SOLN
0.5000 mg | Freq: Once | INTRAMUSCULAR | Status: AC
  Administered 2019-03-19: 0.5 mg via INTRAVENOUS
  Filled 2019-03-19: qty 1

## 2019-03-19 NOTE — Progress Notes (Signed)
Call received from Dr Posey Pronto with new orders to discontinue isolation precautions and COVID screen. Charge nurse, Sharyn Lull, notified.

## 2019-03-19 NOTE — Progress Notes (Signed)
Report called to [facility] on [address]. Transferred via bed to 5[address] Room 4.

## 2019-03-19 NOTE — Progress Notes (Signed)
Patient transfered from [place] . Patient is alert and oriented. Denies any pain. Patient oriented to the room and unit. Call bell is within reach. Sitter with patient. Will monitor.

## 2019-03-19 NOTE — Progress Notes (Signed)
Patient getting very agitated, refusing to listen to the staff, wanting to go home. Md made aware, ativan given,patient still very agitated despite ativan, wanting to walk around and stating I dont have the right to keep him herer. Explain to the patient that we are after his safety but patient stated that I can hire an attorney ,and he will get his own attorney. Patient refused to put on yellow socks, wanting to walk by the hallway, sitter with the patient. Md paged
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
## 2019-03-20 DIAGNOSIS — F321 Major depressive disorder, single episode, moderate: Secondary | ICD-10-CM

## 2019-03-20 DIAGNOSIS — R6889 Other general symptoms and signs: Secondary | ICD-10-CM | POA: Diagnosis present

## 2019-03-20 DIAGNOSIS — R531 Weakness: Secondary | ICD-10-CM

## 2019-03-20 LAB — BLOOD CULTURE ID PANEL (REFLEXED)
Acinetobacter baumannii: NOT DETECTED
Candida albicans: NOT DETECTED
Candida glabrata: NOT DETECTED
Candida krusei: NOT DETECTED
Candida parapsilosis: NOT DETECTED
Candida tropicalis: NOT DETECTED
Enterobacter cloacae complex: NOT DETECTED
Enterobacteriaceae species: NOT DETECTED
Enterococcus species: NOT DETECTED
Escherichia coli: NOT DETECTED
Haemophilus influenzae: NOT DETECTED
Klebsiella oxytoca: NOT DETECTED
Klebsiella pneumoniae: NOT DETECTED
LISTERIA MONOCYTOGENES: NOT DETECTED
Methicillin resistance: NOT DETECTED
Neisseria meningitidis: NOT DETECTED
Proteus species: NOT DETECTED
Pseudomonas aeruginosa: NOT DETECTED
STAPHYLOCOCCUS AUREUS BCID: NOT DETECTED
STAPHYLOCOCCUS SPECIES: DETECTED — AB
Serratia marcescens: NOT DETECTED
Streptococcus agalactiae: NOT DETECTED
Streptococcus pneumoniae: NOT DETECTED
Streptococcus pyogenes: NOT DETECTED
Streptococcus species: NOT DETECTED

## 2019-03-20 LAB — URINE CULTURE
Culture: <10000 — AB
Special Requests: NORMAL

## 2019-03-20 LAB — GLUCOSE, CAPILLARY
Glucose-Capillary: 112 mg/dL — ABNORMAL HIGH (ref 70–99)
Glucose-Capillary: 106 mg/dL — ABNORMAL HIGH (ref 70–99)
Glucose-Capillary: 110 mg/dL — ABNORMAL HIGH (ref 70–99)
Glucose-Capillary: 123 mg/dL — ABNORMAL HIGH (ref 70–99)

## 2019-03-20 MED ORDER — HALOPERIDOL LACTATE 5 MG/ML IJ SOLN: 2.5000 mg | Freq: Once | INTRAMUSCULAR | Status: DC

## 2019-03-20 MED ORDER — FLUOXETINE HCL 10 MG PO CAPS
10.0000 mg | ORAL_CAPSULE | Freq: Every day | ORAL | Status: DC
  Administered 2019-03-21 – 2019-03-23 (×3): 10 mg via ORAL
  Filled 2019-03-20 (×4): qty 1

## 2019-03-20 NOTE — Progress Notes (Addendum)
08 Pt is confused, restless, wanting to get out to get his car, looking for his car keys. Tried to reoriented pt. Animal nutritionist came.  10 Breakfast tray in, pt ate very little. Offered juice and water. Pt's son Zalen Sequeira called back and is aware of pt's situation. 1600 Pt is calm and cooperative. Standing at the side of the bed to use the urinal. 1830 Pt cannot urinate, bladder scanned for 446. Dr Rodena Piety notified. In and out cath done, had 350 ml dark urine. Pt was agitated, security came.

## 2019-03-20 NOTE — Progress Notes (Signed)
PHARMACY - PHYSICIAN COMMUNICATION CRITICAL VALUE ALERT - BLOOD CULTURE IDENTIFICATION (BCID)  Chris Mercer is an 73 y.o. male who presented to [facility] on 03/18/2019 with a chief complaint of altered mental status   Name of physician (or Provider) Contacted: Landis Gandy  Current antibiotics: None  Changes to prescribed antibiotics recommended:  Likely contaminant, no antibiotics needed  Results for orders placed or performed during the hospital encounter of 03/18/19  Blood Culture ID Panel (Reflexed) (Collected: 03/18/2019  5:57 PM)  Result Value Ref Range   Enterococcus species NOT DETECTED NOT DETECTED   Listeria monocytogenes NOT DETECTED NOT DETECTED   Staphylococcus species DETECTED (A) NOT DETECTED   Staphylococcus aureus (BCID) NOT DETECTED NOT DETECTED   Methicillin resistance NOT DETECTED NOT DETECTED   Streptococcus species NOT DETECTED NOT DETECTED   Streptococcus agalactiae NOT DETECTED NOT DETECTED   Streptococcus pneumoniae NOT DETECTED NOT DETECTED   Streptococcus pyogenes NOT DETECTED NOT DETECTED   Acinetobacter baumannii NOT DETECTED NOT DETECTED   Enterobacteriaceae species NOT DETECTED NOT DETECTED   Enterobacter cloacae complex NOT DETECTED NOT DETECTED   Escherichia coli NOT DETECTED NOT DETECTED   Klebsiella oxytoca NOT DETECTED NOT DETECTED   Klebsiella pneumoniae NOT DETECTED NOT DETECTED   Proteus species NOT DETECTED NOT DETECTED   Serratia marcescens NOT DETECTED NOT DETECTED   Haemophilus influenzae NOT DETECTED NOT DETECTED   Neisseria meningitidis NOT DETECTED NOT DETECTED   Pseudomonas aeruginosa NOT DETECTED NOT DETECTED   Candida albicans NOT DETECTED NOT DETECTED   Candida glabrata NOT DETECTED NOT DETECTED   Candida krusei NOT DETECTED NOT DETECTED   Candida parapsilosis NOT DETECTED NOT DETECTED   Candida tropicalis NOT DETECTED NOT DETECTED    Chris Mercer, [name] 03/20/2019  10:38 AM
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
## 2019-03-20 NOTE — Consult Note (Addendum)
[facility] Consult   Reason for Consult: ''AMS question medication which could lead to further altered mental status.'' Referring Physician:  Dr. Rodena Piety Patient Identification: Chris Mercer MRN:  [id number] Principal Diagnosis: Altered mental status Diagnosis:  Principal Problem:   Altered mental status Active Problems:   Neuropathy   Hypertension associated with diabetes ([place])   Type 2 diabetes mellitus (HCC)   Chronic pain syndrome   Flu-like symptoms   Generalized weakness   Total Time spent with patient: 45 minutes  Subjective:   Chris Mercer is a 73 y.o. male patient admitted due to altered mental status.  HPI:  73 year old  male with past medical history of Depression, Anxiety, HTN, HLD, DM type II, Neuropathy, and Chronic pain syndrome followed by pain management. He was brought to the ED after police found him driving erratically with altered mental status. Patient reports that he was doing fairly fine on all his medications for anxiety/depression and chronic pain until he asked his PCP to add Abilify to his medication for depression two weeks ago. He reports feeling excessive tired a week after taking it and stopped taking the Abilify a week ago. Patient was apparently brought to the when his symptoms got worse. Patient reports being prescribed Xanax, Abilify, Buspar, Gabapentin, Buprenorphine and Viibryd. Today, he is calm, cooperative, fairly alert but only oriented to place and person, not to time. He denies psychosis, delusions, SI/HI, drugs/alcohol abuse but says he has been  felling depressed since his wife died 2 years ago.  Past Psychiatric History: as above  Risk to Self:  denies Risk to Others:  denies Prior Inpatient Therapy:  denies Prior Outpatient Therapy:  PCP  Past Medical History:  Past Medical History:  Diagnosis Date  . Diabetes mellitus without complication ([place])   . Hypertension   . Neuropathy     Past Surgical History:   Procedure Laterality Date  . APPENDECTOMY    . CORONARY ANGIOPLASTY WITH STENT PLACEMENT    . HERNIA REPAIR     x3   Family History:  Family History  Problem Relation Age of Onset  . Heart disease Father    Family Psychiatric  History:  Social History:  Social History   Substance and Sexual Activity  Alcohol Use No  . Frequency: Never     Social History   Substance and Sexual Activity  Drug Use Not on file    Social History   Socioeconomic History  . Marital status: Single    Spouse name: Not on file  . Number of children: Not on file  . Years of education: Not on file  . Highest education level: Not on file  Occupational History  . Not on file  Social Needs  . Financial resource strain: Not on file  . Food insecurity:    Worry: Not on file    Inability: Not on file  . Transportation needs:    Medical: Not on file    Non-medical: Not on file  Tobacco Use  . Smoking status: Never Smoker  . Smokeless tobacco: Never Used  Substance and Sexual Activity  . Alcohol use: No    Frequency: Never  . Drug use: Not on file  . Sexual activity: Not on file  Lifestyle  . Physical activity:    Days per week: Not on file    Minutes per session: Not on file  . Stress: Not on file  Relationships  . Social connections:    Talks on phone:  Not on file    Gets together: Not on file    Attends religious service: Not on file    Active member of club or organization: Not on file    Attends meetings of clubs or organizations: Not on file    Relationship status: Not on file  Other Topics Concern  . Not on file  Social History Narrative  . Not on file   Additional Social History:    Allergies:   Allergies  Allergen Reactions  . Azithromycin Anaphylaxis, Swelling and Hives  . Fenoprofen Anaphylaxis  . Duloxetine Diarrhea    With dark stools       Labs:  Results for orders placed or performed during the hospital encounter of 03/18/19 (from the past 48 hour(s))   Lactic acid, plasma     Status: Abnormal   Collection Time: 03/18/19  5:49 PM  Result Value Ref Range   Lactic Acid, Venous 2.6 (HH) 0.5 - 1.9 mmol/L    Comment: CRITICAL RESULT CALLED TO, READ BACK BY AND VERIFIED WITH: BERTRAND,S RN 03/18/2019 1847 JORDANS Performed at [facility], [place] [address], [place], [place] [address]   Comprehensive metabolic panel     Status: Abnormal   Collection Time: 03/18/19  5:49 PM  Result Value Ref Range   Sodium 140 135 - 145 mmol/L   Potassium 3.6 3.5 - 5.1 mmol/L   Chloride 112 (H) 98 - 111 mmol/L   CO2 21 (L) 22 - 32 mmol/L   Glucose, Bld 186 (H) 70 - 99 mg/dL   BUN 26 (H) 8 - 23 mg/dL   Creatinine, Ser 1.30 (H) 0.61 - 1.24 mg/dL   Calcium 9.0 8.9 - 10.3 mg/dL   Total Protein 5.9 (L) 6.5 - 8.1 g/dL   Albumin 3.3 (L) 3.5 - 5.0 g/dL   AST 37 15 - 41 U/L   ALT 26 0 - 44 U/L   Alkaline Phosphatase 102 38 - 126 U/L   Total Bilirubin 0.2 (L) 0.3 - 1.2 mg/dL   GFR calc non Af Amer 55 (L) >60 mL/min   GFR calc Af Amer >60 >60 mL/min   Anion gap 7 5 - 15    Comment: Performed at [facility], [place] [address], [place],  [address]  CBC WITH DIFFERENTIAL     Status: Abnormal   Collection Time: 03/18/19  5:49 PM  Result Value Ref Range   WBC 8.8 4.0 - 10.5 K/uL   RBC 3.90 (L) 4.22 - 5.81 MIL/uL   Hemoglobin 10.9 (L) 13.0 - 17.0 g/dL   HCT 35.3 (L) 39.0 - 52.0 %   MCV 90.5 80.0 - 100.0 fL   MCH 27.9 26.0 - 34.0 pg   MCHC 30.9 30.0 - 36.0 g/dL   RDW 14.5 11.5 - 15.5 %   Platelets 197 150 - 400 K/uL   nRBC 0.0 0.0 - 0.2 %   Neutrophils Relative % 75 %   Neutro Abs 6.6 1.7 - 7.7 K/uL   Lymphocytes Relative 15 %   Lymphs Abs 1.3 0.7 - 4.0 K/uL   Monocytes Relative 6 %   Monocytes Absolute 0.6 0.1 - 1.0 K/uL   Eosinophils Relative 3 %   Eosinophils Absolute 0.3 0.0 - 0.5 K/uL   Basophils Relative 0 %   Basophils Absolute 0.0 0.0 - 0.1 K/uL   Immature Granulocytes 1 %   Abs Immature Granulocytes 0.05 0.00 - 0.07 K/uL     Comment: Performed at [facility]  Lab, [address], [place], [place] [address]  TSH     Status: None   Collection Time: 03/18/19  5:50 PM  Result Value Ref Range   TSH 2.244 0.350 - 4.500 uIU/mL    Comment: Performed by a 3rd Generation assay with a functional sensitivity of <=0.01 uIU/mL. Performed at [facility], [place] [address] [place], [place] [address]   Ethanol     Status: None   Collection Time: 03/18/19  5:50 PM  Result Value Ref Range   Alcohol, Ethyl (B) <10 <10 mg/dL    Comment: (NOTE) Lowest detectable limit for serum alcohol is 10 mg/dL. For medical purposes only. Performed at [facility], [place] [address], [place], [place] [address]   Blood Culture (routine x 2)     Status: None (Preliminary result)   Collection Time: 03/18/19  5:57 PM  Result Value Ref Range   Specimen Description BLOOD RIGHT HAND    Special Requests      BOTTLES DRAWN AEROBIC AND ANAEROBIC Blood Culture adequate volume   Culture  Setup Time      GRAM POSITIVE COCCI ANAEROBIC BOTTLE ONLY Organism ID to follow CRITICAL RESULT CALLED TO, READ BACK BY AND VERIFIED WITH: PHARMD R RUMBARGER [id number] Performed at [facility], [place] [address], [place], [place] [address]    Culture GRAM POSITIVE COCCI    Report Status PENDING   Blood Culture ID Panel (Reflexed)     Status: Abnormal   Collection Time: 03/18/19  5:57 PM  Result Value Ref Range   Enterococcus species NOT DETECTED NOT DETECTED   Listeria monocytogenes NOT DETECTED NOT DETECTED   Staphylococcus species DETECTED (A) NOT DETECTED    Comment: Methicillin (oxacillin) susceptible coagulase negative staphylococcus. Possible blood culture contaminant (unless isolated from more than one blood culture draw or clinical case suggests pathogenicity). No antibiotic treatment is indicated for blood  culture contaminants. CRITICAL RESULT CALLED TO, READ BACK BY AND VERIFIED WITH: PHARMD R RUMBARGER [id number] MLM     Staphylococcus aureus (BCID) NOT DETECTED NOT DETECTED   Methicillin resistance NOT DETECTED NOT DETECTED   Streptococcus species NOT DETECTED NOT DETECTED   Streptococcus agalactiae NOT DETECTED NOT DETECTED   Streptococcus pneumoniae NOT DETECTED NOT DETECTED   Streptococcus pyogenes NOT DETECTED NOT DETECTED   Acinetobacter baumannii NOT DETECTED NOT DETECTED   Enterobacteriaceae species NOT DETECTED NOT DETECTED   Enterobacter cloacae complex NOT DETECTED NOT DETECTED   Escherichia coli NOT DETECTED NOT DETECTED   Klebsiella oxytoca NOT DETECTED NOT DETECTED   Klebsiella pneumoniae NOT DETECTED NOT DETECTED   Proteus species NOT DETECTED NOT DETECTED   Serratia marcescens NOT DETECTED NOT DETECTED   Haemophilus influenzae NOT DETECTED NOT DETECTED   Neisseria meningitidis NOT DETECTED NOT DETECTED   Pseudomonas aeruginosa NOT DETECTED NOT DETECTED   Candida albicans NOT DETECTED NOT DETECTED   Candida glabrata NOT DETECTED NOT DETECTED   Candida krusei NOT DETECTED NOT DETECTED   Candida parapsilosis NOT DETECTED NOT DETECTED   Candida tropicalis NOT DETECTED NOT DETECTED    Comment: Performed at [facility], [address] [place],  [address]  Blood Culture (routine x 2)     Status: None (Preliminary result)   Collection Time: 03/18/19  6:01 PM  Result Value Ref Range   Specimen Description BLOOD LEFT ANTECUBITAL    Special Requests      BOTTLES DRAWN AEROBIC AND ANAEROBIC Blood Culture adequate volume  Culture      NO GROWTH 2 DAYS Performed at [facility], [place] [address], [place], [place] [address]    Report Status PENDING   Lactic acid, plasma     Status: None   Collection Time: 03/18/19  7:55 PM  Result Value Ref Range   Lactic Acid, Venous 1.6 0.5 - 1.9 mmol/L    Comment: Performed at [facility], [place] [address], [place], [place] [address]  Urinalysis, Routine w reflex microscopic     Status: Abnormal   Collection Time: 03/18/19  7:55 PM   Result Value Ref Range   Color, Urine YELLOW YELLOW   APPearance HAZY (A) CLEAR   Specific Gravity, Urine 1.024 1.005 - 1.030   pH 5.0 5.0 - 8.0   Glucose, UA NEGATIVE NEGATIVE mg/dL   Hgb urine dipstick NEGATIVE NEGATIVE   Bilirubin Urine NEGATIVE NEGATIVE   Ketones, ur NEGATIVE NEGATIVE mg/dL   Protein, ur NEGATIVE NEGATIVE mg/dL   Nitrite NEGATIVE NEGATIVE   Leukocytes,Ua NEGATIVE NEGATIVE    Comment: Performed at [facility] [address], [place], [place] [address]  Urine culture     Status: Abnormal   Collection Time: 03/18/19  7:55 PM  Result Value Ref Range   Specimen Description URINE, CLEAN CATCH    Special Requests Normal    Culture (A)     <10,000 COLONIES/mL INSIGNIFICANT GROWTH Performed at [facility] [address], [place], [place] [address]    Report Status 03/20/2019 FINAL   Urine rapid drug screen (hosp performed)     Status: Abnormal   Collection Time: 03/18/19  7:55 PM  Result Value Ref Range   Opiates NONE DETECTED NONE DETECTED   Cocaine NONE DETECTED NONE DETECTED   Benzodiazepines POSITIVE (A) NONE DETECTED   Amphetamines NONE DETECTED NONE DETECTED   Tetrahydrocannabinol NONE DETECTED NONE DETECTED   Barbiturates NONE DETECTED NONE DETECTED    Comment: (NOTE) DRUG SCREEN FOR MEDICAL PURPOSES ONLY.  IF CONFIRMATION IS NEEDED FOR ANY PURPOSE, NOTIFY LAB WITHIN 5 DAYS. LOWEST DETECTABLE LIMITS FOR URINE DRUG SCREEN Drug Class                     Cutoff (ng/mL) Amphetamine and metabolites    1000 Barbiturate and metabolites    200 Benzodiazepine                 614 Tricyclics and metabolites     300 Opiates and metabolites        300 Cocaine and metabolites        300 THC                            50 Performed at [facility], [place] [address], [place], [place] [address]   Influenza panel by PCR (type A & B)     Status: None   Collection Time: 03/18/19  7:55 PM  Result Value Ref Range   Influenza A By PCR NEGATIVE  NEGATIVE   Influenza B By PCR NEGATIVE NEGATIVE    Comment: (NOTE) The Xpert Xpress Flu assay is intended as an aid in the diagnosis of  influenza and should not be used as a sole basis for treatment.  This  assay is FDA approved for nasopharyngeal swab specimens only. Nasal  washings and aspirates are unacceptable for Xpert Xpress Flu testing. Performed at [facility], [address] [place], [place] [address]   Basic metabolic panel     Status: Abnormal   Collection Time: 03/19/19  2:24 AM  Result Value Ref Range   Sodium 143 135 - 145 mmol/L   Potassium 3.6 3.5 - 5.1 mmol/L   Chloride 111 98 - 111 mmol/L   CO2 24 22 - 32 mmol/L   Glucose, Bld 96 70 - 99 mg/dL   BUN 25 (H) 8 - 23 mg/dL   Creatinine, Ser 1.16 0.61 - 1.24 mg/dL   Calcium 8.9 8.9 - 10.3 mg/dL   GFR calc non Af Amer >60 >60 mL/min   GFR calc Af Amer >60 >60 mL/min   Anion gap 8 5 - 15    Comment: Performed at [facility], [place] [address], [place], [place] [address]  CBC     Status: Abnormal   Collection Time: 03/19/19  2:24 AM  Result Value Ref Range   WBC 6.0 4.0 - 10.5 K/uL   RBC 3.69 (L) 4.22 - 5.81 MIL/uL   Hemoglobin 10.2 (L) 13.0 - 17.0 g/dL   HCT 33.1 (L) 39.0 - 52.0 %   MCV 89.7 80.0 - 100.0 fL   MCH 27.6 26.0 - 34.0 pg   MCHC 30.8 30.0 - 36.0 g/dL   RDW 14.2 11.5 - 15.5 %   Platelets 192 150 - 400 K/uL   nRBC 0.0 0.0 - 0.2 %    Comment: Performed at [facility], [place] [address] [place], [place] [address]  Glucose, capillary     Status: Abnormal   Collection Time: 03/19/19  6:50 AM  Result Value Ref Range   Glucose-Capillary 115 (H) 70 - 99 mg/dL   Comment 1 Document in Chart   Glucose, capillary     Status: Abnormal   Collection Time: 03/19/19 11:11 AM  Result Value Ref Range   Glucose-Capillary 142 (H) 70 - 99 mg/dL  Glucose, capillary     Status: None   Collection Time: 03/19/19  3:54 PM  Result Value Ref Range   Glucose-Capillary 91 70 - 99 mg/dL  Glucose, capillary      Status: Abnormal   Collection Time: 03/19/19 10:05 PM  Result Value Ref Range   Glucose-Capillary 127 (H) 70 - 99 mg/dL   Comment 1 Document in Chart   Glucose, capillary     Status: Abnormal   Collection Time: 03/20/19  8:01 AM  Result Value Ref Range   Glucose-Capillary 112 (H) 70 - 99 mg/dL    Current Facility-Administered Medications  Medication Dose Route Frequency Provider Last Rate Last Dose  . 0.9 %  sodium chloride infusion   Intravenous Continuous Fuller Plan A, MD   Stopped at 03/19/19 1821  . acetaminophen (TYLENOL) tablet 650 mg  650 mg Oral Q6H PRN Lenore Cordia, MD   650 mg at 03/20/19 1093   Or  . acetaminophen (TYLENOL) suppository 650 mg  650 mg Rectal Q6H PRN Lenore Cordia, MD      . ALPRAZolam Duanne Moron) tablet 0.25-0.5 mg  0.25-0.5 mg Oral BID PRN Fuller Plan A, MD   0.5 mg at 03/20/19 0310  . busPIRone (BUSPAR) tablet 7.5 mg  7.5 mg Oral BID Tamala Julian, Rondell A, MD   7.5 mg at 03/20/19 0840  . enoxaparin (LOVENOX) injection 40 mg  40 mg Subcutaneous Q24H Zada Finders R, MD   40 mg at 03/20/19 0841  . haloperidol lactate (HALDOL) injection 1 mg  1 mg Intravenous Q6H  PRN Fuller Plan A, MD   1 mg at 03/19/19 2034  . haloperidol lactate (HALDOL) injection 2.5 mg  2.5 mg Intravenous Once Bodenheimer, Charles A, NP      . insulin aspart (novoLOG) injection 0-9 Units  0-9 Units Subcutaneous TID WC Lenore Cordia, MD   1 Units at 03/19/19 1219  . lisinopril (PRINIVIL,ZESTRIL) tablet 40 mg  40 mg Oral Daily Lenore Cordia, MD   40 mg at 03/20/19 0909  . pantoprazole (PROTONIX) EC tablet 40 mg  40 mg Oral Daily Lenore Cordia, MD   40 mg at 03/20/19 0840  . pravastatin (PRAVACHOL) tablet 40 mg  40 mg Oral QHS Lenore Cordia, MD   40 mg at 03/19/19 0032  . prazosin (MINIPRESS) capsule 2 mg  2 mg Oral QHS Lenore Cordia, MD        Musculoskeletal: Strength & Muscle Tone: NOT TESTED Gait & Station: unable to stand Patient leans: N/A  Psychiatric Specialty  Exam: Physical Exam  Psychiatric: Judgment and thought content normal. His speech is slurred. He is aggressive and combative. Cognition and memory are normal. He exhibits a depressed mood.    Review of Systems  Constitutional: Negative.   HENT: Negative.   Eyes: Negative.   Cardiovascular: Negative.   Gastrointestinal: Negative.   Musculoskeletal: Positive for joint pain.  Skin: Negative.   Neurological: Positive for speech change.  Endo/Heme/Allergies: Negative.   Psychiatric/Behavioral: Positive for depression. The patient is nervous/anxious.     Blood pressure (!) 157/95, pulse 72, temperature 98 F (36.7 C), temperature source Oral, resp. rate 20, height 6\' 1"  (1.854 m), weight 99.8 kg, SpO2 100 %.Body mass index is 29.03 kg/m.  General Appearance: Casual  Eye Contact:  Good  Speech:  Clear and Coherent  Volume:  Normal  Mood:  Dysphoric  Affect:  Congruent  Thought Process:  Coherent  Orientation:  Other:  only to place and person  Thought Content:  Logical  Suicidal Thoughts:  No  Homicidal Thoughts:  No  Memory:  Immediate;   Fair Recent;   Fair Remote;   Fair  Judgement:  Poor  Insight:  Shallow  Psychomotor Activity:  Increased and Restlessness  Concentration:  Concentration: Fair and Attention Span: Fair  Recall:  [employer] of Knowledge:  Fair  Language:  Good  Akathisia:  No  Handed:  Right  AIMS (if indicated):     Assets:  Communication Skills Desire for Improvement  ADL's:  Intact  Cognition:  WNL  Sleep:   fair     Treatment Plan Summary: 73 y.o. male with past medical history significant for Depression, Anxiety, Chronic pain, HTN, HLD, DM type II, and Neuropathy  Who was admitted after he was found driving erratically with altered mental status. Today, patient is cooperative, alert, awake but his behavior is unpredictable.   Recommendations: -Continue 1:1 sitter for safety -Continue Buspar 7.5 mg BID for anxiety -Consider 1 mg Haldol PO or IM  every 6 hr as needed for agitation. -Discontinue Abilify-may increased sedation, AMS in patient on Buprenorphine -Consider weaning patient off Benzodiazepine, may increased AMS due to increased sedation in patient on Buprenorphine. -Consider Prozac 10 mg daily for depression. -Consider reaching out to patient's pain management doctor to consider avoiding Abilify/benzodiazepine in patient on Buprenorphine due increased sedation.    Disposition: Patient does not meet criteria for psychiatric inpatient admission. Psychiatric service siging off, re-consult as needed  Corena Pilgrim, MD 03/20/2019 1:50 PM

## 2019-03-20 NOTE — Progress Notes (Addendum)
During bedside report, sitter reported that patient tried to kick him several times on his face. Upon assessment, patient is on bilateral soft wrist restraint and trying to chew the restraint. Pt combative, agitated and yelling out loud. Upon RN assessing wrist restraint, this RN and NT witnessed patient tried to kick the sitter. RN paged on call [facility] hospitalist and ordered to apply bilateral soft ankle restraint. Soft ankle restraint was initiated. Nursing will continue to monitor.

## 2019-03-20 NOTE — Plan of Care (Signed)
  Problem: Skin Integrity: Goal: Risk for impaired skin integrity will decrease Outcome: Progressing   Problem: Safety: Goal: Ability to remain free from injury will improve Outcome: Not Progressing Pt still needing to have a Air cabin crew.   Problem: Pain Managment: Goal: General experience of comfort will improve Outcome: Progressing   Problem: Elimination: Goal: Will not experience complications related to bowel motility Outcome: Progressing   Problem: Coping: Goal: Level of anxiety will decrease Outcome: Not Progressing
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
## 2019-03-21 DIAGNOSIS — F19921 Other psychoactive substance use, unspecified with intoxication with delirium: Secondary | ICD-10-CM

## 2019-03-21 DIAGNOSIS — F329 Major depressive disorder, single episode, unspecified: Secondary | ICD-10-CM | POA: Diagnosis present

## 2019-03-21 DIAGNOSIS — R410 Disorientation, unspecified: Secondary | ICD-10-CM | POA: Diagnosis present

## 2019-03-21 DIAGNOSIS — F32A Depression, unspecified: Secondary | ICD-10-CM | POA: Diagnosis present

## 2019-03-21 DIAGNOSIS — F419 Anxiety disorder, unspecified: Secondary | ICD-10-CM | POA: Diagnosis present

## 2019-03-21 DIAGNOSIS — F3289 Other specified depressive episodes: Secondary | ICD-10-CM

## 2019-03-21 LAB — COMPREHENSIVE METABOLIC PANEL
ALT: 53 U/L — ABNORMAL HIGH (ref 0–44)
ANION GAP: 11 (ref 5–15)
AST: 129 U/L — ABNORMAL HIGH (ref 15–41)
Albumin: 3.1 g/dL — ABNORMAL LOW (ref 3.5–5.0)
Alkaline Phosphatase: 82 U/L (ref 38–126)
BUN: 16 mg/dL (ref 8–23)
CO2: 18 mmol/L — ABNORMAL LOW (ref 22–32)
Calcium: 8.8 mg/dL — ABNORMAL LOW (ref 8.9–10.3)
Chloride: 112 mmol/L — ABNORMAL HIGH (ref 98–111)
Creatinine, Ser: 0.97 mg/dL (ref 0.61–1.24)
GFR calc Af Amer: >60 mL/min (ref >60)
GFR calc non Af Amer: >60 mL/min (ref >60)
Glucose, Bld: 100 mg/dL — ABNORMAL HIGH (ref 70–99)
Potassium: 5 mmol/L (ref 3.5–5.1)
Sodium: 141 mmol/L (ref 135–145)
Total Bilirubin: 1.9 mg/dL — ABNORMAL HIGH (ref 0.3–1.2)
Total Protein: 5.6 g/dL — ABNORMAL LOW (ref 6.5–8.1)

## 2019-03-21 LAB — GLUCOSE, CAPILLARY
GLUCOSE-CAPILLARY: 110 mg/dL — AB (ref 70–99)
Glucose-Capillary: 102 mg/dL — ABNORMAL HIGH (ref 70–99)
Glucose-Capillary: 122 mg/dL — ABNORMAL HIGH (ref 70–99)
Glucose-Capillary: 114 mg/dL — ABNORMAL HIGH (ref 70–99)

## 2019-03-21 LAB — CBC WITH DIFFERENTIAL/PLATELET
ABS IMMATURE GRANULOCYTES: 0.06 10*3/uL (ref 0.00–0.07)
Basophils Absolute: 0 10*3/uL (ref 0.0–0.1)
Basophils Relative: 0 %
Eosinophils Absolute: 0.1 10*3/uL (ref 0.0–0.5)
Eosinophils Relative: 1 %
HCT: 31.5 % — ABNORMAL LOW (ref 39.0–52.0)
HEMOGLOBIN: 10 g/dL — AB (ref 13.0–17.0)
Immature Granulocytes: 1 %
Lymphocytes Relative: 13 %
Lymphs Abs: 1.1 10*3/uL (ref 0.7–4.0)
MCH: 27.7 pg (ref 26.0–34.0)
MCHC: 31.7 g/dL (ref 30.0–36.0)
MCV: 87.3 fL (ref 80.0–100.0)
Monocytes Absolute: 0.6 10*3/uL (ref 0.1–1.0)
Monocytes Relative: 8 %
NEUTROS ABS: 6.2 10*3/uL (ref 1.7–7.7)
NEUTROS PCT: 77 %
Platelets: 210 10*3/uL (ref 150–400)
RBC: 3.61 MIL/uL — ABNORMAL LOW (ref 4.22–5.81)
RDW: 14.3 % (ref 11.5–15.5)
WBC: 8.1 10*3/uL (ref 4.0–10.5)
nRBC: 0 % (ref 0.0–0.2)

## 2019-03-21 MED ORDER — OXYCODONE HCL 5 MG PO TABS
5.0000 mg | ORAL_TABLET | Freq: Four times a day (QID) | ORAL | Status: DC | PRN
  Administered 2019-03-21 – 2019-03-23 (×2): 5 mg via ORAL
  Filled 2019-03-21 (×2): qty 1

## 2019-03-21 NOTE — Progress Notes (Addendum)
PROGRESS NOTE    Chris Mercer  GSU:[id number] DOB: 12/28/1946 DOA: 03/18/2019 PCP: Tracie Harrier, MD  Brief Narrative: 73 y.o. male with medical history significant for type 2 diabetes, hypertension, hyperlipidemia, depression/anxiety, sensory neuropathy, and chronic pain syndrome who was brought to the ED by police after driving erratically.  Patient is providing partial history, the remainder of history is obtained from EDP and chart review.  Patient states he woke up this morning on the floor with knee abrasions and feeling confused.  He cannot recall what happened prior to waking up and does not remember falling.  He is on pain and anxiety medications and believes he has been taking his appropriately as prescribed but also admits it is possible he may have taken his medications incorrectly.   He denies any alcohol use, illicit drug use.  Per ED documentation there was report of 3 weeks of progressive flulike illness symptoms and he was seen by his PCP the day prior to admission and tested negative for flu and respiratory virus panel.  His PCP called him to return for COVID testing but he did not return.  During my examination, he denies any myalgias, seizure-like activity, bowel/bladder incontinence, chest pain, palpitations, shortness of breath, fevers, chills, diaphoresis, cough, new focal weakness, or sensory change apart from his chronic lower extremity sensory neuropathy ED Course:  Initial vitals in the ED showed BP 156/80, pulse 86, RR 18, temp 98.3 Fahrenheit, SPO2 96% on room air.  Labs were notable for WBC 8.8, hemoglobin 10.9, platelets 197, sodium 140, potassium 3.6, BUN 26, creatinine 1.3, serum glucose 186, AST 37, ALT 26, alk phos 102, total bilirubin 0.2, TSH 2.244, ethanol <10, urinalysis negative for UTI, UDS collected and pending, lactic acid 2.6, influenza panel negative.  Blood cultures were collected. He was given 1 L of IV fluids with improvement in lactic acid to 1.6. CT head  without contrast showed mild atrophic changes without acute abnormality.  X-rays of both knees were without acute osseous abnormality.  Portable chest x-ray showed enlarged cardiac silhouette without focal airspace disease.  Per EDP, patient became agitated and wanted to leave [place] however was de-escalated by the ED physician, Dr. Laverta Baltimore.  Preliminary IVC paperwork was filled out by Dr. Laverta Baltimore, as it was felt patient did not have capacity to make decision to leave AMA at that time.  The hospitalist service was consulted to admit for further evaluation and management.   Assessment & Plan:   Principal Problem:   Altered mental status Active Problems:   Neuropathy   Hypertension associated with diabetes ([place])   Type 2 diabetes mellitus (H[facility])   Chronic pain syndrome   Flu-like symptoms   Generalized weakness  Acute encephalopathy/ IVC- Patient had awoken on the floor with abrasions to his knee.  Son reports patient acting differently normal.  Currently prescribed Xanax, Abilify, buprenorphine, BuSpar, gabapentin. Also appears to have been started on Vilazodone (SSRI).  Question the possibility of infection, but appears less likely as respiratory viral panel was negative as well as urinalysis.  CT scan of the brain as well as MRI negative for any signs of acute stroke.  Patient reported possibly taking medications incorrectly.  Denied any alcohol or illicit drug use.  Alcohol level undetectable, and TSH within normal limits.  Urine drug screen positive for benzos.  Placed under involuntary commitment after being agitated threatening to leave against medical advice.  On hospital day 2 patient was more reasonable throughout the day, but became acutely  agitated in the evening taking off his clothes and agitated. Questioning dehydration versus dementia w/ sundowning versus delirium versus polypharmacy.  Son to bring medications from home so that we have a more accurate list.  At this time  patient still presents a danger to himself and is not safe to drive or be at home alone. Pt under IVC.  -seen by psychiatry yesterday 3/22, they diagnosed anxiety and depression w/ altered mental status possibly due to medication interactions.  They suggest that Abilify/ BZD's may interact w/ buprenorphine w/ ^'d sedation.  Recommend we >>  - consider weaning pt off of BZD's (xanax) due to potential for interaction as above; dc'd  - discontinued Abilify due to potential for interaction as above  - consider prn 52m Haldol po or IM q 6 hr for agitation  - cont 1:1 sitter for safety  - cont Buspar 7.5 bid for anxiety  - consider prozac [id number] for depression  - consider talking to pain management doctor to make above changes due to buprenorphine interaction possibility   Anxiety/Depression  -dc'd Xanax as above and dc'd Abilify -Decreased buspirone to 7.5 mg twice daily  - prozac added 10 mg qd  Acute kidney injury 2/2 dehydration: Resolving.  -Recheck kidney function in a.m.   Essential hypertension: Stable -Continue prazosin and lisinopril  Chronic pain/peripheral neuropathy: Patient being followed by pain management outpatient setting.   -Holding gabapentin and buprenorphine at this time  Suspected penile trauma: Resolved   Recent flulike illness: Patient reports having no symptoms currently.  Influenza screen negative.  Lactic acidosis: Resolved.  Suspect secondary to dehydration most likely.  Diabetes mellitus type II: Patient not on any diabetic medications. -Carb modified diet -CBGs q. before meals and at bedtime with sensitive SSI   DVT prophylaxis: Lovenox code  Status: Full code Family Communication: None Disposition Plan: Pending clinical improvement   [facility] [id number]/23/2020, 1:36 PM     Consultants:  - psychiatry 03/20/2019  Procedures: None Antimicrobials: None    Subjective: Patient is in restraints again this am, but doing  better and arms have been released. Still has some pressured speech, but not actively acting out this am.  No specific c/o.    Objective: Vitals:   03/20/19 1510 03/20/19 2139 03/21/19 0500 03/21/19 0730  BP: (!) 176/94 (!) 186/84 (!) 148/96 (!) 160/80  Pulse: (!) 59 64 62 70  Resp: '19 18 16   ' Temp: 98 F (36.7 C) 97.6 F (36.4 C) 99 F (37.2 C) 97.6 F (36.4 C)  TempSrc: Oral Oral Oral Oral  SpO2: 99% 97% 98% 94%  Weight:      Height:       Scheduled Meds: . busPIRone  7.5 mg Oral BID  . enoxaparin (LOVENOX) injection  40 mg Subcutaneous Q24H  . FLUoxetine  10 mg Oral Daily  . haloperidol lactate  2.5 mg Intravenous Once  . insulin aspart  0-9 Units Subcutaneous TID WC  . lisinopril  40 mg Oral Daily  . pantoprazole  40 mg Oral Daily  . pravastatin  40 mg Oral QHS  . prazosin  2 mg Oral QHS   Continuous Infusions: . sodium chloride 75 mL/hr at 03/20/19 1523     Intake/Output Summary (Last 24 hours) at 03/21/2019 1327 Last data filed at 03/21/2019 0903 Gross per 24 hour  Intake 520 ml  Output 850 ml  Net -330 ml   Filed Weights   03/18/19 1800  Weight: 99.8 kg  Examination:  General exam: Appears calm and comfortable  Respiratory system: Clear to auscultation. Respiratory effort normal. Cardiovascular system: S1 & S2 heard, RRR. No JVD, murmurs, rubs, gallops or clicks. No pedal edema. Gastrointestinal system: Abdomen is nondistended, soft and nontender. No organomegaly or masses felt. Normal bowel sounds heard. Central nervous system: Alert and oriented. No focal neurological deficits. Extremities: Symmetric 5 x 5 power. Skin: No rashes, lesions or ulcers  Data Reviewed: I have personally reviewed following labs and imaging studies  CBC: Recent Labs  Lab 03/18/19 1749 03/19/19 0224 03/21/19 0600  WBC 8.8 6.0 8.1  NEUTROABS 6.6  --  6.2  HGB 10.9* 10.2* 10.0*  HCT 35.3* 33.1* 31.5*  MCV 90.5 89.7 87.3  PLT 197 192 599   Basic Metabolic Panel:  Recent Labs  Lab 03/18/19 1749 03/19/19 0224 03/21/19 0319  NA 140 143 141  K 3.6 3.6 5.0  CL 112* 111 112*  CO2 21* 24 18*  GLUCOSE 186* 96 100*  BUN 26* 25* 16  CREATININE 1.30* 1.16 0.97  CALCIUM 9.0 8.9 8.8*   GFR: Estimated Creatinine Clearance: 86.8 mL/min (by C-G formula based on SCr of 0.97 mg/dL). Liver Function Tests: Recent Labs  Lab 03/18/19 1749 03/21/19 0319  AST 37 129*  ALT 26 53*  ALKPHOS 102 82  BILITOT 0.2* 1.9*  PROT 5.9* 5.6*  ALBUMIN 3.3* 3.1*   No results for input(s): LIPASE, AMYLASE in the last 168 hours. No results for input(s): AMMONIA in the last 168 hours. Coagulation Profile: No results for input(s): INR, PROTIME in the last 168 hours. Cardiac Enzymes: No results for input(s): CKTOTAL, CKMB, CKMBINDEX, TROPONINI in the last 168 hours. BNP (last 3 results) No results for input(s): PROBNP in the last 8760 hours. HbA1C: No results for input(s): HGBA1C in the last 72 hours. CBG: Recent Labs  Lab 03/20/19 0801 03/20/19 1135 03/20/19 1728 03/20/19 2134 03/21/19 0625  GLUCAP 112* 106* 110* 123* 102*   Lipid Profile: No results for input(s): CHOL, HDL, LDLCALC, TRIG, CHOLHDL, LDLDIRECT in the last 72 hours. Thyroid Function Tests: Recent Labs    03/18/19 1750  TSH 2.244   Anemia Panel: No results for input(s): VITAMINB12, FOLATE, FERRITIN, TIBC, IRON, RETICCTPCT in the last 72 hours. Sepsis Labs: Recent Labs  Lab 03/18/19 1749 03/18/19 1955  LATICACIDVEN 2.6* 1.6    Recent Results (from the past 240 hour(s))  Blood Culture (routine x 2)     Status: Abnormal (Preliminary result)   Collection Time: 03/18/19  5:57 PM  Result Value Ref Range Status   Specimen Description BLOOD RIGHT HAND  Final   Special Requests   Final    BOTTLES DRAWN AEROBIC AND ANAEROBIC Blood Culture adequate volume   Culture  Setup Time   Final    GRAM POSITIVE COCCI ANAEROBIC BOTTLE ONLY CRITICAL RESULT CALLED TO, READ BACK BY AND VERIFIED WITH:  PHARMD R RUMBARGER [id number] MLM    Culture (A)  Final    STAPHYLOCOCCUS SPECIES (COAGULASE NEGATIVE) THE SIGNIFICANCE OF ISOLATING THIS ORGANISM FROM A SINGLE SET OF BLOOD CULTURES WHEN MULTIPLE SETS ARE DRAWN IS UNCERTAIN. PLEASE NOTIFY THE MICROBIOLOGY DEPARTMENT WITHIN ONE WEEK IF SPECIATION AND SENSITIVITIES ARE REQUIRED. Performed at [facility], [place] [address], [place], [place] [address]    Report Status PENDING  Incomplete  Blood Culture ID Panel (Reflexed)     Status: Abnormal   Collection Time: 03/18/19  5:57 PM  Result Value Ref Range Status   Enterococcus species  NOT DETECTED NOT DETECTED Final   Listeria monocytogenes NOT DETECTED NOT DETECTED Final   Staphylococcus species DETECTED (A) NOT DETECTED Final    Comment: Methicillin (oxacillin) susceptible coagulase negative staphylococcus. Possible blood culture contaminant (unless isolated from more than one blood culture draw or clinical case suggests pathogenicity). No antibiotic treatment is indicated for blood  culture contaminants. CRITICAL RESULT CALLED TO, READ BACK BY AND VERIFIED WITH: PHARMD R RUMBARGER [id number] MLM    Staphylococcus aureus (BCID) NOT DETECTED NOT DETECTED Final   Methicillin resistance NOT DETECTED NOT DETECTED Final   Streptococcus species NOT DETECTED NOT DETECTED Final   Streptococcus agalactiae NOT DETECTED NOT DETECTED Final   Streptococcus pneumoniae NOT DETECTED NOT DETECTED Final   Streptococcus pyogenes NOT DETECTED NOT DETECTED Final   Acinetobacter baumannii NOT DETECTED NOT DETECTED Final   Enterobacteriaceae species NOT DETECTED NOT DETECTED Final   Enterobacter cloacae complex NOT DETECTED NOT DETECTED Final   Escherichia coli NOT DETECTED NOT DETECTED Final   Klebsiella oxytoca NOT DETECTED NOT DETECTED Final   Klebsiella pneumoniae NOT DETECTED NOT DETECTED Final   Proteus species NOT DETECTED NOT DETECTED Final   Serratia marcescens NOT DETECTED NOT DETECTED Final    Haemophilus influenzae NOT DETECTED NOT DETECTED Final   Neisseria meningitidis NOT DETECTED NOT DETECTED Final   Pseudomonas aeruginosa NOT DETECTED NOT DETECTED Final   Candida albicans NOT DETECTED NOT DETECTED Final   Candida glabrata NOT DETECTED NOT DETECTED Final   Candida krusei NOT DETECTED NOT DETECTED Final   Candida parapsilosis NOT DETECTED NOT DETECTED Final   Candida tropicalis NOT DETECTED NOT DETECTED Final    Comment: Performed at [facility], [address] [place], [place] [address]  Blood Culture (routine x 2)     Status: None (Preliminary result)   Collection Time: 03/18/19  6:01 PM  Result Value Ref Range Status   Specimen Description BLOOD LEFT ANTECUBITAL  Final   Special Requests   Final    BOTTLES DRAWN AEROBIC AND ANAEROBIC Blood Culture adequate volume   Culture   Final    NO GROWTH 3 DAYS Performed at [facility], [place] [address] [place], [place] [address]    Report Status PENDING  Incomplete  Urine culture     Status: Abnormal   Collection Time: 03/18/19  7:55 PM  Result Value Ref Range Status   Specimen Description URINE, CLEAN CATCH  Final   Special Requests Normal  Final   Culture (A)  Final    <10,000 COLONIES/mL INSIGNIFICANT GROWTH Performed at [facility], [place] [address] [place], [place] [address]    Report Status 03/20/2019 FINAL  Final         Radiology Studies: Mr Brain Wo Contrast  Result Date: 03/19/2019 CLINICAL DATA:  Altered level of consciousness EXAM: MRI HEAD WITHOUT CONTRAST TECHNIQUE: Multiplanar, multiecho pulse sequences of the brain and surrounding structures were obtained without intravenous contrast. COMPARISON:  CT head 03/18/2019 FINDINGS: Brain: Negative for acute infarct. Mild atrophy. Minimal chronic microvascular ischemic change in the white matter and pons. Negative for hemorrhage mass or edema. No midline shift. Image quality degraded by mild motion. Vascular: Normal arterial flow voids Skull  and upper cervical spine: Negative Sinuses/Orbits: Mild mucosal edema paranasal sinuses. Negative orbit Other: None IMPRESSION: No acute intracranial abnormality Mild atrophy and mild chronic microvascular ischemic changes. Electronically Signed   By: Franchot Gallo M.D.   On: 03/19/2019 14:48  LOS: 2 days    If 7PM-7AM, please contact night-coverage [email or web address] Password [email or web address] 03/21/2019, 1:27 PM

## 2019-03-21 NOTE — Plan of Care (Signed)

## 2019-03-21 NOTE — Progress Notes (Signed)
In and out was done and got 300 ml yellow dark urine. Bright red blood was noticed at the tip of the catheter upon pulling it out. RN notified on call triad hospitalist. No active bleeding was noted. Nursing will continue to monitor.

## 2019-03-21 NOTE — Progress Notes (Signed)
Patient alert to and verbal. Oriented to self and place. Disoriented to time and situation. Appears calm at this time. Speech clear and coherent. Request for restrains to be discontinued. Patient on 4 point restrain. Bilateral  hands restrain released and patient advised that if he tries to hit or punch staff members, restrain will be put back on. Patient eating breakfast. Sitter at bedside. Will continue assessing patient and need for restrains.

## 2019-03-21 NOTE — Progress Notes (Addendum)
RN noted redness on patient's right posterior distal forearm during assessment. Pink foam dressing applied to right forearm. Restraints still can not be restricted due to patient's unpredictable behavior. Nursing will continue to monitor.

## 2019-03-21 NOTE — Progress Notes (Signed)
Patient remains calm and cooperative. Alert and verbal. Bil leg restrains removed. Patient understands that if he is physically abusive, retrains will be put back on. Patient assisted to bathroom and back to bed.  Watching tv at this time. Sitter at bed side. Will continue to monitor.

## 2019-03-21 NOTE — Progress Notes (Signed)
Chaplain visited per request through spiritual consult.  Patient says he did not request prayer as consult said.  Patient exhibits still altered mental status believing chaplain is spouse of person who brought him here or another patient.  Chaplain listened to patient's storyline. Patient came from [place].  Wife Vaughan Basta died 3 years ago from complications with lupus Two sons, Sherren Mocha and Marjory Lies. Sherren Mocha is Agricultural consultant in [place].  Marjory Lies lives nearby and is an Chief Financial Officer.  Wife's father played with [employer]. She was from [place], [place].  A dog-lover, patient is missing his [place]. Chaplain provided ministry of presence. Tamsen Snider Pager [phone]

## 2019-03-22 ENCOUNTER — Encounter (HOSPITAL_COMMUNITY): Payer: Self-pay | Admitting: General Practice

## 2019-03-22 DIAGNOSIS — F329 Major depressive disorder, single episode, unspecified: Secondary | ICD-10-CM

## 2019-03-22 LAB — COMPREHENSIVE METABOLIC PANEL
ALK PHOS: 85 U/L (ref 38–126)
ALT: 54 U/L — ABNORMAL HIGH (ref 0–44)
AST: 79 U/L — ABNORMAL HIGH (ref 15–41)
Albumin: 3.2 g/dL — ABNORMAL LOW (ref 3.5–5.0)
Anion gap: 11 (ref 5–15)
BUN: 16 mg/dL (ref 8–23)
CALCIUM: 8.8 mg/dL — AB (ref 8.9–10.3)
CO2: 19 mmol/L — ABNORMAL LOW (ref 22–32)
Chloride: 112 mmol/L — ABNORMAL HIGH (ref 98–111)
Creatinine, Ser: 1.02 mg/dL (ref 0.61–1.24)
GFR calc Af Amer: >60 mL/min (ref >60)
GFR calc non Af Amer: >60 mL/min (ref >60)
Glucose, Bld: 100 mg/dL — ABNORMAL HIGH (ref 70–99)
Potassium: 3.6 mmol/L (ref 3.5–5.1)
Sodium: 142 mmol/L (ref 135–145)
TOTAL PROTEIN: 6 g/dL — AB (ref 6.5–8.1)
Total Bilirubin: 0.6 mg/dL (ref 0.3–1.2)

## 2019-03-22 LAB — CBC
HCT: 31.6 % — ABNORMAL LOW (ref 39.0–52.0)
Hemoglobin: 10 g/dL — ABNORMAL LOW (ref 13.0–17.0)
MCH: 27.9 pg (ref 26.0–34.0)
MCHC: 31.6 g/dL (ref 30.0–36.0)
MCV: 88 fL (ref 80.0–100.0)
Platelets: 213 10*3/uL (ref 150–400)
RBC: 3.59 MIL/uL — ABNORMAL LOW (ref 4.22–5.81)
RDW: 14.5 % (ref 11.5–15.5)
WBC: 8.2 10*3/uL (ref 4.0–10.5)
nRBC: 0.2 % (ref 0.0–0.2)

## 2019-03-22 LAB — GLUCOSE, CAPILLARY
GLUCOSE-CAPILLARY: 86 mg/dL (ref 70–99)
Glucose-Capillary: 104 mg/dL — ABNORMAL HIGH (ref 70–99)
Glucose-Capillary: 114 mg/dL — ABNORMAL HIGH (ref 70–99)
Glucose-Capillary: 111 mg/dL — ABNORMAL HIGH (ref 70–99)

## 2019-03-22 LAB — CULTURE, BLOOD (ROUTINE X 2): SPECIAL REQUESTS: ADEQUATE

## 2019-03-22 MED ORDER — SODIUM CHLORIDE 0.9 % IV BOLUS
500.0000 mL | Freq: Once | INTRAVENOUS | Status: AC
  Administered 2019-03-22: 500 mL via INTRAVENOUS

## 2019-03-22 NOTE — Progress Notes (Signed)
PROGRESS NOTE    Chris Mercer  UXN:[id number] DOB: 1946/11/02 DOA: 03/18/2019 PCP: Tracie Harrier, MD   Brief Narrative: 73 y.o. male with medical history significant for type 2 diabetes, hypertension, hyperlipidemia, depression/anxiety, sensory neuropathy, and chronic pain syndrome who was brought to the ED by police after driving erratically.  Patient is providing partial history, the remainder of history is obtained from EDP and chart review.  Patient states he woke up this morning on the floor with knee abrasions and feeling confused.  He cannot recall what happened prior to waking up and does not remember falling.  He is on pain and anxiety medications and believes he has been taking his appropriately as prescribed but also admits it is possible he may have taken his medications incorrectly.   He denies any alcohol use, illicit drug use.  Per ED documentation there was report of 3 weeks of progressive flulike illness symptoms and he was seen by his PCP the day prior to admission and tested negative for flu and respiratory virus panel.  His PCP called him to return for COVID testing but he did not return.  During my examination, he denies any myalgias, seizure-like activity, bowel/bladder incontinence, chest pain, palpitations, shortness of breath, fevers, chills, diaphoresis, cough, new focal weakness, or sensory change apart from his chronic lower extremity sensory neuropathy ED Course:  Initial vitals in the ED showed BP 156/80, pulse 86, RR 18, temp 98.3 Fahrenheit, SPO2 96% on room air.  Labs were notable for WBC 8.8, hemoglobin 10.9, platelets 197, sodium 140, potassium 3.6, BUN 26, creatinine 1.3, serum glucose 186, AST 37, ALT 26, alk phos 102, total bilirubin 0.2, TSH 2.244, ethanol <10, urinalysis negative for UTI, UDS collected and pending, lactic acid 2.6, influenza panel negative.  Blood cultures were collected. He was given 1 L of IV fluids with improvement in lactic acid to 1.6. CT  head without contrast showed mild atrophic changes without acute abnormality.  X-rays of both knees were without acute osseous abnormality.  Portable chest x-ray showed enlarged cardiac silhouette without focal airspace disease.  Per EDP, patient became agitated and wanted to leave [place] however was de-escalated by the ED physician, Dr. Laverta Baltimore.  Preliminary IVC paperwork was filled out by Dr. Laverta Baltimore, as it was felt patient did not have capacity to make decision to leave AMA at that time.  The hospitalist service was consulted to admit for further evaluation and management.   Assessment & Plan:   Principal Problem:   Medication-induced delirium, acute, mixed level of activity (HCC) Active Problems:   Type 2 diabetes mellitus (HCC)   Chronic pain syndrome   Altered mental status   Hypertension associated with diabetes ([place])   Flu-like symptoms   Depression   Anxiety   Neuropathy   Generalized weakness  Acute encephalopathy/ IVC- Patient had awoken on the floor with abrasions to his knee.  Son reports patient acting differently normal.  Currently prescribed Xanax, Abilify, buprenorphine, BuSpar, gabapentin. Also appears to have been started on Vilazodone (SSRI).  Question the possibility of infection, but appears less likely as respiratory viral panel was negative as well as urinalysis.  CT scan of the brain as well as MRI negative for any signs of acute stroke.  Patient reported possibly taking medications incorrectly.  Denied any alcohol or illicit drug use.  Alcohol level undetectable, and TSH within normal limits.  Urine drug screen positive for benzos.  Placed under involuntary commitment after being agitated threatening to leave against  medical advice.  On hospital day 2 patient was more reasonable throughout the day, but became acutely agitated in the evening taking off his clothes and agitated. Questioning dehydration versus dementia w/ sundowning versus delirium versus  polypharmacy.  Son to bring medications from home so that we have a more accurate list.  At this time patient still presents a danger to himself and is not safe to drive or be at home alone. Pt under IVC.  -seen by psychiatry 3/22, they diagnosed anxiety and depression w/ altered mental status possibly due to medication interactions.  They suggest that Abilify/ BZD's may interact w/ buprenorphine w/ ^'d sedation.  Recommended >>   - consider weaning pt off of BZD's   - discontinue Abilify    - consider prn 31m Haldol po or IM q 6 hr for agitation   - cont 1:1 sitter for safety   - cont Buspar 7.5 bid for anxiety   - consider prozac [id number] for depression   - consider talking to pain management doctor to make above changes due to  buprenorphine interaction possibility   - today pt is more alert and stating that the Buprenorphine he thinks was causing the most problems with regard to his thinking/ memory/ behavior  - pt is improving off of so many psych and pain meds   - will continue prozac and Buspar only from here   - we have dc'd Abilify, Xanax and Buprenorphine and neurontin  - he is not having any sig pain, started prn Oxy IR per pharm rec's      - will dc IVC today   Anxiety/Depression  -dc'd Xanax and Abilify as above -Decreased buspirone to 7.5 mg twice daily  - prozac added 10 mg qd  Acute kidney injury 2/2 dehydration: Resolving.  -Recheck kidney function in a.m.   Essential hypertension: Stable -Continue prazosin and lisinopril  Chronic pain/peripheral neuropathy: Patient being followed by pain management outpatient setting.   Has not had sig pain here.  Depression is primary issue.  - dc'd gabapentin and buprenorphine  Recent flulike illness: Patient reports having no symptoms currently.  Influenza screen negative.  Diabetes mellitus type II: diet controlled , BS's ok - will dc SSI and BS checks -Carb modified diet  Urine retention: will dc foley and get post void  bladder scan   DVT prophylaxis: Lovenox code  Status: Full code Family Communication: None Disposition Plan: improving daily, will consult PT for rec's on disposition in 1-2 days should be medically ready for dispo   [facility] [id number]/24/2020, 10:58 AM     Consultants:  - psychiatry 03/20/2019  Procedures: None Antimicrobials: None    Subjective: patietn doing better today, able to sit up by himself, fully oriented and responds appropriatley.   Objective: Vitals:   03/21/19 1510 03/21/19 1513 03/21/19 2019 03/22/19 0800  BP: (!) 168/89 (!) 159/83 (!) 177/88 (!) 176/92  Pulse: 67 65 74 83  Resp: '18 18 18 19  ' Temp: 97.8 F (36.6 C)  98.2 F (36.8 C)   TempSrc: Oral     SpO2: 98% 99% 98% 96%  Weight:      Height:       Scheduled Meds: . busPIRone  7.5 mg Oral BID  . enoxaparin (LOVENOX) injection  40 mg Subcutaneous Q24H  . FLUoxetine  10 mg Oral Daily  . haloperidol lactate  2.5 mg Intravenous Once  . insulin aspart  0-9 Units Subcutaneous TID WC  . lisinopril  40 mg Oral Daily  . pantoprazole  40 mg Oral Daily  . pravastatin  40 mg Oral QHS  . prazosin  2 mg Oral QHS   Continuous Infusions: . sodium chloride 75 mL/hr at 03/20/19 1523     Intake/Output Summary (Last 24 hours) at 03/22/2019 1058 Last data filed at 03/22/2019 0948 Gross per 24 hour  Intake 4428.57 ml  Output 575 ml  Net 3853.57 ml   Filed Weights   03/18/19 1800  Weight: 99.8 kg    Examination:  General exam: Appears calm and comfortable O x 3 today, no restraints today Respiratory system: Clear to auscultation. Respiratory effort normal. Cardiovascular system: S1 & S2 heard, RRR. No JVD, murmurs, rubs, gallops or clicks. No pedal edema. Gastrointestinal system: Abdomen is nondistended, soft and nontender. No organomegaly or masses felt. Normal bowel sounds heard. Central nervous system: Alert and oriented. No focal neurological deficits. Extremities: Symmetric 5 x 5  power. Skin: No rashes, lesions or ulcers  Data Reviewed: I have personally reviewed following labs and imaging studies  CBC: Recent Labs  Lab 03/18/19 1749 03/19/19 0224 03/21/19 0600 03/22/19 0314  WBC 8.8 6.0 8.1 8.2  NEUTROABS 6.6  --  6.2  --   HGB 10.9* 10.2* 10.0* 10.0*  HCT 35.3* 33.1* 31.5* 31.6*  MCV 90.5 89.7 87.3 88.0  PLT 197 192 210 408   Basic Metabolic Panel: Recent Labs  Lab 03/18/19 1749 03/19/19 0224 03/21/19 0319 03/22/19 0314  NA 140 143 141 142  K 3.6 3.6 5.0 3.6  CL 112* 111 112* 112*  CO2 21* 24 18* 19*  GLUCOSE 186* 96 100* 100*  BUN 26* 25* 16 16  CREATININE 1.30* 1.16 0.97 1.02  CALCIUM 9.0 8.9 8.8* 8.8*   GFR: Estimated Creatinine Clearance: 82.6 mL/min (by C-G formula based on SCr of 1.02 mg/dL). Liver Function Tests: Recent Labs  Lab 03/18/19 1749 03/21/19 0319 03/22/19 0314  AST 37 129* 79*  ALT 26 53* 54*  ALKPHOS 102 82 85  BILITOT 0.2* 1.9* 0.6  PROT 5.9* 5.6* 6.0*  ALBUMIN 3.3* 3.1* 3.2*   No results for input(s): LIPASE, AMYLASE in the last 168 hours. No results for input(s): AMMONIA in the last 168 hours. Coagulation Profile: No results for input(s): INR, PROTIME in the last 168 hours. Cardiac Enzymes: No results for input(s): CKTOTAL, CKMB, CKMBINDEX, TROPONINI in the last 168 hours. BNP (last 3 results) No results for input(s): PROBNP in the last 8760 hours. HbA1C: No results for input(s): HGBA1C in the last 72 hours. CBG: Recent Labs  Lab 03/21/19 0625 03/21/19 1125 03/21/19 1708 03/21/19 2112 03/22/19 0625  GLUCAP 102* 110* 122* 114* 86   Lipid Profile: No results for input(s): CHOL, HDL, LDLCALC, TRIG, CHOLHDL, LDLDIRECT in the last 72 hours. Thyroid Function Tests: No results for input(s): TSH, T4TOTAL, FREET4, T3FREE, THYROIDAB in the last 72 hours. Anemia Panel: No results for input(s): VITAMINB12, FOLATE, FERRITIN, TIBC, IRON, RETICCTPCT in the last 72 hours. Sepsis Labs: Recent Labs  Lab  03/18/19 1749 03/18/19 1955  LATICACIDVEN 2.6* 1.6    Recent Results (from the past 240 hour(s))  Blood Culture (routine x 2)     Status: Abnormal   Collection Time: 03/18/19  5:57 PM  Result Value Ref Range Status   Specimen Description BLOOD RIGHT HAND  Final   Special Requests   Final    BOTTLES DRAWN AEROBIC AND ANAEROBIC Blood Culture adequate volume   Culture  Setup Time   Final  GRAM POSITIVE COCCI ANAEROBIC BOTTLE ONLY CRITICAL RESULT CALLED TO, READ BACK BY AND VERIFIED WITH: PHARMD R RUMBARGER [phone] MLM    Culture (A)  Final    STAPHYLOCOCCUS SPECIES (COAGULASE NEGATIVE) THE SIGNIFICANCE OF ISOLATING THIS ORGANISM FROM A SINGLE SET OF BLOOD CULTURES WHEN MULTIPLE SETS ARE DRAWN IS UNCERTAIN. PLEASE NOTIFY THE MICROBIOLOGY DEPARTMENT WITHIN ONE WEEK IF SPECIATION AND SENSITIVITIES ARE REQUIRED. Performed at [facility], [place] [address], [place], [place] [address]    Report Status 03/22/2019 FINAL  Final  Blood Culture ID Panel (Reflexed)     Status: Abnormal   Collection Time: 03/18/19  5:57 PM  Result Value Ref Range Status   Enterococcus species NOT DETECTED NOT DETECTED Final   Listeria monocytogenes NOT DETECTED NOT DETECTED Final   Staphylococcus species DETECTED (A) NOT DETECTED Final    Comment: Methicillin (oxacillin) susceptible coagulase negative staphylococcus. Possible blood culture contaminant (unless isolated from more than one blood culture draw or clinical case suggests pathogenicity). No antibiotic treatment is indicated for blood  culture contaminants. CRITICAL RESULT CALLED TO, READ BACK BY AND VERIFIED WITH: PHARMD R RUMBARGER [phone] MLM    Staphylococcus aureus (BCID) NOT DETECTED NOT DETECTED Final   Methicillin resistance NOT DETECTED NOT DETECTED Final   Streptococcus species NOT DETECTED NOT DETECTED Final   Streptococcus agalactiae NOT DETECTED NOT DETECTED Final   Streptococcus pneumoniae NOT DETECTED NOT DETECTED Final    Streptococcus pyogenes NOT DETECTED NOT DETECTED Final   Acinetobacter baumannii NOT DETECTED NOT DETECTED Final   Enterobacteriaceae species NOT DETECTED NOT DETECTED Final   Enterobacter cloacae complex NOT DETECTED NOT DETECTED Final   Escherichia coli NOT DETECTED NOT DETECTED Final   Klebsiella oxytoca NOT DETECTED NOT DETECTED Final   Klebsiella pneumoniae NOT DETECTED NOT DETECTED Final   Proteus species NOT DETECTED NOT DETECTED Final   Serratia marcescens NOT DETECTED NOT DETECTED Final   Haemophilus influenzae NOT DETECTED NOT DETECTED Final   Neisseria meningitidis NOT DETECTED NOT DETECTED Final   Pseudomonas aeruginosa NOT DETECTED NOT DETECTED Final   Candida albicans NOT DETECTED NOT DETECTED Final   Candida glabrata NOT DETECTED NOT DETECTED Final   Candida krusei NOT DETECTED NOT DETECTED Final   Candida parapsilosis NOT DETECTED NOT DETECTED Final   Candida tropicalis NOT DETECTED NOT DETECTED Final    Comment: Performed at [facility], [address], [place], [place] [address]  Blood Culture (routine x 2)     Status: None (Preliminary result)   Collection Time: 03/18/19  6:01 PM  Result Value Ref Range Status   Specimen Description BLOOD LEFT ANTECUBITAL  Final   Special Requests   Final    BOTTLES DRAWN AEROBIC AND ANAEROBIC Blood Culture adequate volume   Culture   Final    NO GROWTH 3 DAYS Performed at [facility], [place] [address], [place], [place] [address]    Report Status PENDING  Incomplete  Urine culture     Status: Abnormal   Collection Time: 03/18/19  7:55 PM  Result Value Ref Range Status   Specimen Description URINE, CLEAN CATCH  Final   Special Requests Normal  Final   Culture (A)  Final    <10,000 COLONIES/mL INSIGNIFICANT GROWTH Performed at [facility], [place] [address], [place], [place] [address]    Report Status 03/20/2019 FINAL  Final       If 7PM-7AM, please contact night-coverage [email or web address] Password [facility]  03/22/2019, 10:58 AM

## 2019-03-22 NOTE — Progress Notes (Signed)
Rounded on Pt resting comfortably on bed, alert, calm and cooperative. Noted bruising on bilateral arms and swelling on IV site, IV removed. Pt states his R arm is broken, pink foam dressing removed for assessment, noted bruising, no pain upon movement, explained to Pt that his arm is not broken but has some bruising. Sitter at bedside. Will continue to monitor.

## 2019-03-22 NOTE — Plan of Care (Signed)

## 2019-03-22 NOTE — Evaluation (Signed)
Physical Therapy Evaluation Patient Details Name: Chris Mercer MRN: [id number] DOB: [birth year] Today's Date: 03/22/2019   History of Present Illness  73 y.o.malewith medical history significant fortype 2 diabetes, hypertension, hyperlipidemia, depression/anxiety, sensory neuropathy, and chronic pain syndromewho was brought to the ED by police after drivingerratically. Patient states he woke up the morning of admission on the floor with knee abrasions and feeling confused. Hospital course complicated by agitation; working diagnosis of medication-induced delirium.   Clinical Impression   Pt admitted with above diagnosis. Pt currently with functional limitations due to the deficits listed below (see PT Problem List). Independent prior to admission; Presents with gait and balance dysfunction, incr fall risk; Looks like his mental status is improving, and my hope is while he improves medically, his ambulation, function and balance will improve; Will plan to discern RW versus cane for best assistive device (if he needs one) next session; I'm unsure of how much support he will be able to have from family at home;  Pt will benefit from skilled PT to increase their independence and safety with mobility to allow discharge to the venue listed below.    Per ED documentation there was report of 3 weeks of progressive flulike illness symptoms and he was seen by his PCP the day prior to admission and tested negative for flu and respiratory virus panel. His PCP called him to return for COVID testing but he did not return. COVID test not ordered this admission    Follow Up Recommendations Home health PT (will continue to discern -- may not need it) [facility] for safety with meds    Equipment Recommendations  Rolling walker with 5" wheels;3in1 (PT);Other (comment)(will continue to discern RW versus cane)    Recommendations for Other Services       Precautions / Restrictions Precautions Precautions:  Fall Precaution Comments: has needed a safety sitter      Mobility  Bed Mobility                  Transfers Overall transfer level: Needs assistance Equipment used: (grab bar in bathroom) Transfers: Sit to/from Stand Sit to Stand: Min guard(without physical contact)         General transfer comment: Stood from shower seat in bathroom; notable dependence on UEs using grab bar to pull up from low surface  Ambulation/Gait Ambulation/Gait assistance: Counsellor (Feet): 200 Feet Assistive device: None;Straight cane Gait Pattern/deviations: Step-through pattern;Staggering left;Staggering right Gait velocity: erratic   General Gait Details: Initially with tendency to reach out for UE support for balance, tending to use hallway rail; then walked hallways with cane; erratic step width, which is indicative of decr stance stability, with mild losses of balance throughout walk, which he recovered from without physical assist  Stairs Stairs: Yes Stairs assistance: Min guard Stair Management: Two rails;Alternating pattern;Forwards Number of Stairs: 5 General stair comments: one instance of loss of balance, catching R foot on step ascending, caught himself on rails  Wheelchair Mobility    Modified Rankin (Stroke Patients Only)       Balance Overall balance assessment: Needs assistance;History of Falls           Standing balance-Leahy Scale: Fair                               Pertinent Vitals/Pain Pain Assessment: Faces Faces Pain Scale: Hurts a little bit Pain Location: Reports neuropathic pain bilateral feet Pain Descriptors /  Indicators: Aching Pain Intervention(s): Monitored during session    Home Living Family/patient expects to be discharged to:: Private residence Living Arrangements: Alone Available Help at Discharge: Family;Available PRN/intermittently Type of Home: House Home Access: Level entry     Home Layout: One  level Home Equipment: None      Prior Function Level of Independence: Independent         Comments: was still driving; retired     Journalist, newspaper        Extremity/Trunk Assessment   Upper Extremity Assessment Upper Extremity Assessment: Overall WFL for tasks assessed    Lower Extremity Assessment Lower Extremity Assessment: Generalized weakness       Communication   Communication: No difficulties  Cognition Arousal/Alertness: Awake/alert Behavior During Therapy: WFL for tasks assessed/performed Overall Cognitive Status: Within Functional Limits for tasks assessed(for simple mobility tasks)                                        General Comments      Exercises     Assessment/Plan    PT Assessment Patient needs continued PT services  PT Problem List Decreased strength;Decreased activity tolerance;Decreased balance;Decreased mobility;Decreased coordination;Decreased knowledge of use of DME;Decreased knowledge of precautions;Pain       PT Treatment Interventions DME instruction;Gait training;Stair training;Functional mobility training;Therapeutic activities;Therapeutic exercise;Balance training;Neuromuscular re-education;Cognitive remediation;Patient/family education    PT Goals (Current goals can be found in the Care Plan section)  Acute Rehab PT Goals Patient Stated Goal: wants to take a shower PT Goal Formulation: With patient Time For Goal Achievement: 04/05/19 Potential to Achieve Goals: Good    Frequency Min 3X/week   Barriers to discharge Decreased caregiver support Need more info re: how much suport he has at home; currently a high fall risk    Co-evaluation               AM-PAC PT "6 Clicks" Mobility  Outcome Measure Help needed turning from your back to your side while in a flat bed without using bedrails?: None Help needed moving from lying on your back to sitting on the side of a flat bed without using bedrails?:  None Help needed moving to and from a bed to a chair (including a wheelchair)?: A Little Help needed standing up from a chair using your arms (e.g., wheelchair or bedside chair)?: A Little Help needed to walk in hospital room?: A Little Help needed climbing 3-5 steps with a railing? : A Little 6 Click Score: 20    End of Session Equipment Utilized During Treatment: Gait belt Activity Tolerance: Patient tolerated treatment well Patient left: Other (comment)(with Nurse tech, preparing to shower) Nurse Communication: Mobility status PT Visit Diagnosis: Unsteadiness on feet (R26.81);Other abnormalities of gait and mobility (R26.89)    Time: 3845-3646 PT Time Calculation (min) (ACUTE ONLY): 10 min   Charges:   PT Evaluation $PT Eval Moderate Complexity: 1 Mod          Roney [place], [place]  Acute Rehabilitation Services Pager [phone] Office [phone]   Colletta [place] 03/22/2019, 4:37 PM

## 2019-03-22 NOTE — Progress Notes (Signed)
Reported given from [place] [phone]. Pt AxO x4. No c/o pain, Respiratory pattern even and unlabored, chest expansion symmetrical. Sitting on chair with sitter. He is calm and follows commands. Will continue to monitor. Blood sugar 114.

## 2019-03-22 NOTE — Progress Notes (Signed)
Foley discontinued per MD.

## 2019-03-23 DIAGNOSIS — R400 Somnolence: Secondary | ICD-10-CM

## 2019-03-23 DIAGNOSIS — L8990 Pressure ulcer of unspecified site, unspecified stage: Secondary | ICD-10-CM

## 2019-03-23 LAB — CULTURE, BLOOD (ROUTINE X 2)
Culture: NO GROWTH
Special Requests: ADEQUATE

## 2019-03-23 MED ORDER — OXYCODONE HCL 5 MG PO TABS: 5.0000 mg | ORAL_TABLET | Freq: Four times a day (QID) | ORAL | 0 refills | Status: DC | PRN

## 2019-03-23 MED ORDER — BUSPIRONE HCL 7.5 MG PO TABS: 7.5000 mg | ORAL_TABLET | Freq: Two times a day (BID) | ORAL | 1 refills | Status: DC

## 2019-03-23 MED ORDER — FLUOXETINE HCL 10 MG PO CAPS: 10.0000 mg | ORAL_CAPSULE | Freq: Every day | ORAL | 1 refills | Status: DC

## 2019-03-23 NOTE — TOC Initial Note (Signed)
Transition of Care [facility]) - Initial/Assessment Note    Patient Details  Name: Chris Mercer MRN: [id number] Date of Birth: 07-Jun-1946  Transition of Care [facility]) CM/SW Contact:    Ninfa Meeker, RN Phone Number: 03/23/2019, 11:13 AM  Clinical Narrative:   73 yr old male admitted with flu like symptoms, which have resolved.  Medication delirium which has resolved and anxiety. CM  Offered choice for [facility] agency, referral called to Joen Laura, [facility]. CM spoke with patient's son Marjory Lies [phone] concerning his dad's discharge. He will pick him up at 11am, will call patient's bedside RN to have him brought to [facility] entrance.            Expected Discharge Plan: [place] Barriers to Discharge: No Barriers Identified   Patient Goals and CMS Choice     Choice offered to / list presented to : Patient, Adult Children  Expected Discharge Plan and Services Expected Discharge Plan: [place]   Discharge Planning Services: CM Consult Post Acute Care Choice: [place] arrangements for the past 2 months: Single Family Home Expected Discharge Date: 03/23/19               DME Arranged: N/A DME Agency: NA HH Arranged: PT HH Agency: [facility] [employer]  Prior Living Arrangements/Services Living arrangements for the past 2 months: [place] with:: Self   Do you feel safe going back to the place where you live?: Yes      Need for Family Participation in Patient Care: No (Comment) Care giver support system in place?: Yes (comment)      Activities of Daily Living Home Assistive Devices/Equipment: None ADL Screening (condition at time of admission) Patient's cognitive ability adequate to safely complete daily activities?: Yes Is the patient deaf or have difficulty hearing?: No Does the patient have difficulty seeing, even when wearing glasses/contacts?: No Does the  patient have difficulty concentrating, remembering, or making decisions?: Yes Patient able to express need for assistance with ADLs?: No Does the patient have difficulty dressing or bathing?: No Independently performs ADLs?: No Does the patient have difficulty walking or climbing stairs?: No Weakness of Legs: None Weakness of Arms/Hands: None  Permission Sought/Granted Permission sought to share information with : Case Manager                Emotional Assessment Appearance:: Appears stated age     Orientation: : Oriented to Self, Oriented to Place, Oriented to  Time, Oriented to Situation Alcohol / Substance Use: Not Applicable    Admission diagnosis:  Transient alteration of awareness [R40.4] Generalized weakness [R53.1] Flu-like symptoms [R68.89] Patient Active Problem List   Diagnosis Date Noted  . Pressure injury of skin 03/23/2019  . Medication-induced delirium, acute, mixed level of activity ([place]) 03/21/2019  . Depression 03/21/2019  . Anxiety 03/21/2019  . Flu-like symptoms   . Generalized weakness   . Altered mental status 03/18/2019  . Neuropathy 03/18/2019  . Essential hypertension 03/18/2019  . Type 2 diabetes mellitus ([place]) 03/18/2019  . Chronic pain syndrome 03/18/2019   PCP:  Tracie Harrier, MD Pharmacy:   [place], [place] [address] Phone: [phone] Fax: [phone]  [facility] [place] [place] [address] Phone: [phone] Fax: [phone]  [place], [place] [address] Phone: [phone] Fax: [phone]     Social Determinants of Health (SDOH) Interventions    Readmission Risk Interventions No flowsheet data found.

## 2019-03-23 NOTE — Discharge Summary (Signed)
Physician Discharge Summary   Patient ID: Chris Mercer MRN: [id number] DOB/AGE: 1946/02/07 73 y.o.  Admit date: 03/18/2019 Discharge date: 03/23/2019  Primary Care Physician:  Tracie Harrier, MD   Recommendations for Outpatient Follow-up:  1. Follow up with PCP   2. Patient recommended to follow-up with his pain management clinic regarding adjustment of his pain medication due to interactions of his psych medications with buprenorphine   Home Health: Home health PT OT, RN for med management Equipment/Devices: DME walker  Discharge Condition: stable  CODE STATUS: FULL  Diet recommendation: Heart healthy diet   Discharge Diagnoses:    . Acute metabolic encephalopathy . Chronic pain syndrome . Flu-like symptoms resolved . Medication-induced delirium, acute, mixed level of activity ([place]) . Anxiety with depression . Acute kidney injury secondary to dehydration Essential hypertension Diabetes mellitus type 2 Urinary retention   Consults: Psychiatry    Allergies:   Allergies  Allergen Reactions  . Azithromycin Anaphylaxis, Swelling and Hives  . Fenoprofen Anaphylaxis  . Duloxetine Diarrhea    With dark stools        DISCHARGE MEDICATIONS: Allergies as of 03/23/2019      Reactions   Azithromycin Anaphylaxis, Swelling, Hives   Fenoprofen Anaphylaxis   Duloxetine Diarrhea   With dark stools       Medication List    STOP taking these medications   ALPRAZolam 0.5 MG tablet Commonly known as:  XANAX   ARIPiprazole 5 MG tablet Commonly known as:  ABILIFY   Belbuca 300 MCG Film Generic drug:  Buprenorphine HCl   gabapentin 600 MG tablet Commonly known as:  Neurontin     TAKE these medications   busPIRone 7.5 MG tablet Commonly known as:  BUSPAR Take 1 tablet (7.5 mg total) by mouth 2 (two) times daily. What changed:    medication strength  how much to take   FLUoxetine 10 MG capsule Commonly known as:  PROZAC Take 1 capsule (10 mg total) by  mouth daily. Start taking on:  March 24, 2019   lisinopril 40 MG tablet Commonly known as:  PRINIVIL,ZESTRIL Take 40 mg by mouth daily.   oxyCODONE 5 MG immediate release tablet Commonly known as:  Oxy IR/ROXICODONE Take 1 tablet (5 mg total) by mouth every 6 (six) hours as needed for severe pain or breakthrough pain.   pantoprazole 40 MG tablet Commonly known as:  PROTONIX Take 40 mg by mouth daily. FOR 14 DAYS   pravastatin 40 MG tablet Commonly known as:  PRAVACHOL Take 40 mg by mouth at bedtime.   prazosin 2 MG capsule Commonly known as:  MINIPRESS Take 2 mg by mouth at bedtime.            Durable Medical Equipment  (From admission, onward)         Start     Ordered   03/23/19 0859  For home use only DME Walker rolling  Once    Comments:  5 inches wheels  Question:  Patient needs a walker to treat with the following condition  Answer:  Gait instability   03/23/19 0858           Brief H and P: For complete details please refer to admission H and P, but in brief 73 y.o.malewith medical history significant fortype 2 diabetes, hypertension, hyperlipidemia, depression/anxiety, sensory neuropathy, and chronic pain syndromewho was brought to the ED by police after drivingerratically. Patient is providing partial history, the remainder of history is obtained from EDP and chart review.Patient states  he woke up this morning on the floor with knee abrasions and feeling confused. He cannot recall what happened prior to waking up and does not remember falling. He is on pain and anxiety medications and believes he has been taking his appropriately as prescribed but also admits it is possible he may have taken his medications incorrectly. He denies any alcohol use, illicit drug use.  Per ED documentation there was report of 3 weeks of progressive flulike illness symptoms and he was seen by his PCP the day prior to admission and tested negative for flu and respiratory  virus panel. His PCP called him to return for COVIDtesting but he did not return. During my examination, he denies any myalgias, seizure-like activity, bowel/bladder incontinence, chest pain, palpitations, shortness of breath, fevers, chills, diaphoresis, cough, new focal weakness, or sensory change apart from his chronic lower extremitysensory neuropathy ED Course: Initial vitals in the ED showed BP 156/80, pulse 86, RR 18, temp 98.3 Fahrenheit, SPO2 96% on room air.  Labs were notable forWBC 8.8, hemoglobin 10.9, platelets 197, sodium 140, potassium 3.6, BUN 26, creatinine 1.3, serum glucose 186, AST 37, ALT 26, alk phos 102, total bilirubin 0.2, TSH 2.244, ethanol <10, urinalysis negative for UTI, UDS collected and pending, lactic acid 2.6, influenza panel negative. Blood cultures were collected. He was given 1 L of IV fluids with improvement in lactic acid to 1.6. CT head without contrast showed mild atrophic changes without acute abnormality. X-rays of both knees were without acute osseous abnormality. Portable chest x-ray showed enlarged cardiac silhouette without focal airspace disease.  Per EDP, patient became agitated and wantedto leave [place] however was de-escalated by the ED physician, Dr. Laverta Baltimore. Preliminary IVC paperwork was filled out by Dr. Recardo Evangelist it was felt patient did not have capacity to make decision to leave AMA at that time. The hospitalistservice was consulted to admit for further evaluation and management. [facility] Course:   Acute encephalopathy/ IVC -Patient was found on the floor with abrasions to his knees.  Patient son reported him to be acting differently.  Patient was on multiple psychiatric medications including Xanax, Abilify, buprenorphine, BuSpar, gabapentin, also appears to have been started on Vilazodone (SSRI). -CT brain, MRI was negative for any stroke, respiratory virus panel was negative, UA negative. -Patient reported taking his  medications incorrectly.  TSH normal, alcohol level undetectable. -UDS was positive for benzodiazepine. -Initially patient was placed on involuntary commitment after being agitated and threatening to leave AMA. -Currently patient is alert and oriented, suspect his symptoms from polypharmacy and dehydration. -Psychiatry was consulted, patient was seen on 3/22 and also affirmed that patient symptoms were likely due to medication interactions. -Recommended to wean patient off of benzodiazepines, discontinue Abilify.  Recommended BuSpar decrease dose 7.5 mg twice daily and Prozac 10 mg daily.  Recommended talking to his pain management doctor to adjust his pain medications and likely has buprenorphine interactions hence it was discontinued. -  Patient now alert and oriented and states that he thinks buprenorphine was causing the most problems with regard to his thinking/ memory/ behavior.  Neurontin was also discontinued.  Per pharmacy recs, patient was started on low-dose oxycodone IR   Anxiety/Depression -dc'd Xanax and Abilify as above -Decreased buspirone to 7.5 mg twice daily - prozac added 10 mg qd  Acute kidney injury due to dehydration -Resolved.  Patient presented with creatinine of 1.3, improved to 1.0 at the time of discharge.    Essential hypertension: Stable -Currently stable,  continue prazosin, lisinopril   Chronic pain/peripheral neuropathy:  -Patient being followed by pain management outpatient setting. Has not had sig pain here.  Depression is primary issue.  - Gabapentin, buprenorphine discontinued  Recent flulike illness:Patient reports having no symptoms currently. Influenza screen negative.  Diabetes mellitus type II: diet controlled , BS's ok -Continue carb modified diet  Urine retention:  Resolved, Foley catheter were discontinued  Patient was recommended to return back to ED if high fevers, chills, myalgias, joint pains, any GI symptoms.  Day of  Discharge S: No complaints, wants to go home.  BP (!) 148/86 (BP Location: Left Arm)   Pulse 64   Temp 97.8 F (36.6 C) (Oral)   Resp 16   Ht '6\' 1"'  (1.854 m)   Wt 99.8 kg   SpO2 96%   BMI 29.03 kg/m   Physical Exam: General: Alert and awake oriented x3 not in any acute distress. HEENT: anicteric sclera, pupils reactive to light and accommodation CVS: S1-S2 clear no murmur rubs or gallops Chest: clear to auscultation bilaterally, no wheezing rales or rhonchi Abdomen: soft nontender, nondistended, normal bowel sounds Extremities: no cyanosis, clubbing or edema noted bilaterally Neuro: Cranial nerves II-XII intact, no focal neurological deficits   The results of significant diagnostics from this hospitalization (including imaging, microbiology, ancillary and laboratory) are listed below for reference.      Procedures/Studies:  Ct Head Wo Contrast  Result Date: 03/18/2019 CLINICAL DATA:  Altered level of consciousness. EXAM: CT HEAD WITHOUT CONTRAST TECHNIQUE: Contiguous axial images were obtained from the base of the skull through the vertex without intravenous contrast. COMPARISON:  None. FINDINGS: Brain: Mild atrophic changes are noted. No findings to suggest acute hemorrhage, acute infarction or space-occupying mass lesion are seen. Vascular: No hyperdense vessel or unexpected calcification. Skull: Normal. Negative for fracture or focal lesion. Sinuses/Orbits: No acute finding. Other: None. IMPRESSION: Mild atrophic changes without acute abnormality. Electronically Signed   By: Inez Catalina M.D.   On: 03/18/2019 19:25   Mr Brain Wo Contrast  Result Date: 03/19/2019 CLINICAL DATA:  Altered level of consciousness EXAM: MRI HEAD WITHOUT CONTRAST TECHNIQUE: Multiplanar, multiecho pulse sequences of the brain and surrounding structures were obtained without intravenous contrast. COMPARISON:  CT head 03/18/2019 FINDINGS: Brain: Negative for acute infarct. Mild atrophy. Minimal chronic  microvascular ischemic change in the white matter and pons. Negative for hemorrhage mass or edema. No midline shift. Image quality degraded by mild motion. Vascular: Normal arterial flow voids Skull and upper cervical spine: Negative Sinuses/Orbits: Mild mucosal edema paranasal sinuses. Negative orbit Other: None IMPRESSION: No acute intracranial abnormality Mild atrophy and mild chronic microvascular ischemic changes. Electronically Signed   By: Franchot Gallo M.D.   On: 03/19/2019 14:48   Dg Chest Portable 1 View  Result Date: 03/18/2019 CLINICAL DATA:  Difficulty breathing, cough EXAM: PORTABLE CHEST 1 VIEW COMPARISON:  None. FINDINGS: No focal airspace disease. Mild cardiomegaly. No pleural effusion. No pneumothorax. IMPRESSION: Cardiomegaly.  No focal airspace disease. Electronically Signed   By: Donavan Foil M.D.   On: 03/18/2019 19:19   Dg Knee Left Port  Result Date: 03/18/2019 CLINICAL DATA:  Fall EXAM: PORTABLE LEFT KNEE - 1-2 VIEW COMPARISON:  None. FINDINGS: No fracture or malalignment. No significant knee effusion. Minimal patellofemoral degenerative change IMPRESSION: No acute osseous abnormality Electronically Signed   By: Donavan Foil M.D.   On: 03/18/2019 19:19   Dg Knee Right Port  Result Date: 03/18/2019 CLINICAL DATA:  Fall EXAM: PORTABLE RIGHT KNEE -  1-2 VIEW COMPARISON:  None. FINDINGS: No fracture or malalignment. No significant knee effusion. Mild patellofemoral, lateral and medial joint space degenerative change IMPRESSION: No acute osseous abnormality Electronically Signed   By: Donavan Foil M.D.   On: 03/18/2019 19:20       LAB RESULTS: Basic Metabolic Panel: Recent Labs  Lab 03/21/19 0319 03/22/19 0314  NA 141 142  K 5.0 3.6  CL 112* 112*  CO2 18* 19*  GLUCOSE 100* 100*  BUN 16 16  CREATININE 0.97 1.02  CALCIUM 8.8* 8.8*   Liver Function Tests: Recent Labs  Lab 03/21/19 0319 03/22/19 0314  AST 129* 79*  ALT 53* 54*  ALKPHOS 82 85  BILITOT 1.9*  0.6  PROT 5.6* 6.0*  ALBUMIN 3.1* 3.2*   No results for input(s): LIPASE, AMYLASE in the last 168 hours. No results for input(s): AMMONIA in the last 168 hours. CBC: Recent Labs  Lab 03/21/19 0600 03/22/19 0314  WBC 8.1 8.2  NEUTROABS 6.2  --   HGB 10.0* 10.0*  HCT 31.5* 31.6*  MCV 87.3 88.0  PLT 210 213   Cardiac Enzymes: No results for input(s): CKTOTAL, CKMB, CKMBINDEX, TROPONINI in the last 168 hours. BNP: Invalid input(s): POCBNP CBG: Recent Labs  Lab 03/22/19 1631 03/22/19 2130  GLUCAP 114* 111*      Disposition and Follow-up: Discharge Instructions    Diet - low sodium heart healthy   Complete by:  As directed    Increase activity slowly   Complete by:  As directed        DISPOSITION: Home   DISCHARGE FOLLOW-UP Follow-up Information    Tracie Harrier, MD. Schedule an appointment as soon as possible for a visit in 2 week(s).   Specialty:  Internal Medicine Contact information: [address] [place] [address] [phone]        [facility] up.   Specialty:  [place] Why:  A representative from Contact information: [place] [address] [phone]            Time coordinating discharge:  23mns   Signed:   REstill CottaM.D. [facility] 03/23/2019, 10:22 AM

## 2019-03-23 NOTE — Plan of Care (Signed)

## 2019-03-23 NOTE — Progress Notes (Signed)
Provided discharge education/instructions to son Deborra Medina, all questions and concerns addressed, Pt not in distress, discharged home with belongings accompanied by son.

## 2019-03-23 NOTE — Progress Notes (Signed)
Physical Therapy Treatment Patient Details Name: Chris Mercer MRN: [id number] DOB: [birth year] Today's Date: 03/23/2019    History of Present Illness 73 y.o.malewith medical history significant fortype 2 diabetes, hypertension, hyperlipidemia, depression/anxiety, sensory neuropathy, and chronic pain syndromewho was brought to the ED by police after drivingerratically. Patient states he woke up the morning of admission on the floor with knee abrasions and feeling confused. Hospital course complicated by agitation; working diagnosis of medication-induced delirium.     PT Comments    Continuing work on functional mobility and activity tolerance;  More steady gait today; cane is useful for steadiness with balance, and he reports he has one; He did reach to the floor during session and needed to push up from bed to help come back up to stand, this does indicate fall risk persists (but it is improving); Tells me he has a reacher at home that he can use;   [name] indicated he had questions re: his medications;    At the end of the session, he quietly (as if a secret) told me to be careful -- it seems some paranoia thoughts are resurfacing   Follow Up Recommendations  Home health PT;Other (comment);Supervision/Assistance - 24 hour(HHRN for checking meds safety; 24 hour Supervision would be good for safety, not sure if this can be arranged)     Equipment Recommendations  Cane;Other (comment)(reports he has one, and a reacher, too)    Recommendations for Other Services       Precautions / Restrictions Precautions Precautions: Fall Precaution Comments: has needed a Air cabin crew    Mobility  Bed Mobility                  Transfers Overall transfer level: Needs assistance Equipment used: None Transfers: Sit to/from Stand Sit to Stand: Supervision;Min guard         General transfer comment: Stood from recliner without difficulty; at end of session, bent down to pick up a  washcloth from the floor, and needed UE support (pushed up from bed) to stand back up fully, minguard for safety  Ambulation/Gait Ambulation/Gait assistance: Supervision Gait Distance (Feet): 150 Feet Assistive device: Straight cane Gait Pattern/deviations: Step-through pattern     General Gait Details: Better balance today, mildly unsteady, and cane helped; no gross loss of balance, and less reaching out for UE support   Stairs   Stairs assistance: Min guard Stair Management: Alternating pattern;Forwards;One rail Left;With cane Number of Stairs: 5 General stair comments: No loss of balance today   Wheelchair Mobility    Modified Rankin (Stroke Patients Only)       Balance             Standing balance-Leahy Scale: Fair                              Cognition Arousal/Alertness: Awake/alert Behavior During Therapy: Restless(Mildly restless, still pleasant and participating) Overall Cognitive Status: Within Functional Limits for tasks assessed(for simple mobility tasks)                                 General Comments: At the end of the session, he quietly (as if a secret) told me to be careful -- it seems some paranoia thoughts are resurfacing      Exercises      General Comments        Pertinent Vitals/Pain Pain Assessment:  No/denies pain    Home Living                      Prior Function            PT Goals (current goals can now be found in the care plan section) Acute Rehab PT Goals Patient Stated Goal: wants to take a shower PT Goal Formulation: With patient Time For Goal Achievement: 04/05/19 Potential to Achieve Goals: Good Progress towards PT goals: Progressing toward goals    Frequency    Min 3X/week      PT Plan Current plan remains appropriate    Co-evaluation              AM-PAC PT "6 Clicks" Mobility   Outcome Measure  Help needed turning from your back to your side while in a flat  bed without using bedrails?: None Help needed moving from lying on your back to sitting on the side of a flat bed without using bedrails?: None Help needed moving to and from a bed to a chair (including a wheelchair)?: None Help needed standing up from a chair using your arms (e.g., wheelchair or bedside chair)?: None Help needed to walk in hospital room?: None Help needed climbing 3-5 steps with a railing? : None 6 Click Score: 24    End of Session   Activity Tolerance: Patient tolerated treatment well Patient left: in chair;with call bell/phone within reach Nurse Communication: Mobility status PT Visit Diagnosis: Unsteadiness on feet (R26.81);Other abnormalities of gait and mobility (R26.89)     Time: 3220-2542 PT Time Calculation (min) (ACUTE ONLY): 12 min  Charges:  $Gait Training: 8-22 mins                     Roney Marion, [place]  Acute Rehabilitation Services Pager [phone] Office [place] 03/23/2019, 9:23 AM

## 2019-03-24 LAB — CK ISOENZYMES
CK BB: 0 %
CK-MB: 2 % (ref 0–3)
CK-MM: 95 % — ABNORMAL LOW (ref 97–100)
Creatine Kinase-Total: 1367 U/L — ABNORMAL HIGH (ref 24–204)
Macro Type 1: 3 % — ABNORMAL HIGH
Macro Type 2: 0 %

## 2019-03-26 DIAGNOSIS — E785 Hyperlipidemia, unspecified: Secondary | ICD-10-CM | POA: Insufficient documentation

## 2019-03-26 DIAGNOSIS — R911 Solitary pulmonary nodule: Secondary | ICD-10-CM

## 2019-03-26 HISTORY — DX: Solitary pulmonary nodule: R91.1

## 2019-05-26 ENCOUNTER — Ambulatory Visit: Payer: Self-pay | Admitting: Surgery

## 2019-05-30 ENCOUNTER — Ambulatory Visit: Payer: Self-pay | Admitting: Surgery

## 2019-09-09 ENCOUNTER — Encounter: Payer: Self-pay | Admitting: *Deleted

## 2020-07-11 ENCOUNTER — Other Ambulatory Visit: Payer: Self-pay | Admitting: Nephrology

## 2020-07-11 DIAGNOSIS — I129 Hypertensive chronic kidney disease with stage 1 through stage 4 chronic kidney disease, or unspecified chronic kidney disease: Secondary | ICD-10-CM | POA: Insufficient documentation

## 2020-07-11 DIAGNOSIS — N179 Acute kidney failure, unspecified: Secondary | ICD-10-CM

## 2020-07-24 ENCOUNTER — Other Ambulatory Visit: Payer: Self-pay

## 2020-07-24 ENCOUNTER — Ambulatory Visit
Admission: RE | Admit: 2020-07-24 | Discharge: 2020-07-24 | Disposition: A | Discharge status: Home / Self Care | Payer: Medicare HMO | Source: Ambulatory Visit | Attending: Nephrology | Admitting: Nephrology

## 2020-07-24 DIAGNOSIS — N179 Acute kidney failure, unspecified: Secondary | ICD-10-CM | POA: Diagnosis not present

## 2021-02-11 DIAGNOSIS — R600 Localized edema: Secondary | ICD-10-CM | POA: Insufficient documentation

## 2021-02-11 DIAGNOSIS — N1831 Chronic kidney disease, stage 3a: Secondary | ICD-10-CM | POA: Insufficient documentation

## 2021-05-31 DIAGNOSIS — L89893 Pressure ulcer of other site, stage 3: Secondary | ICD-10-CM | POA: Diagnosis not present

## 2021-05-31 DIAGNOSIS — L03031 Cellulitis of right toe: Secondary | ICD-10-CM | POA: Diagnosis not present

## 2021-05-31 DIAGNOSIS — E11621 Type 2 diabetes mellitus with foot ulcer: Secondary | ICD-10-CM | POA: Diagnosis not present

## 2021-06-04 DIAGNOSIS — F411 Generalized anxiety disorder: Secondary | ICD-10-CM | POA: Diagnosis not present

## 2021-06-04 DIAGNOSIS — F330 Major depressive disorder, recurrent, mild: Secondary | ICD-10-CM | POA: Diagnosis not present

## 2021-06-05 DIAGNOSIS — R262 Difficulty in walking, not elsewhere classified: Secondary | ICD-10-CM | POA: Insufficient documentation

## 2021-06-05 DIAGNOSIS — R251 Tremor, unspecified: Secondary | ICD-10-CM | POA: Diagnosis not present

## 2021-06-05 DIAGNOSIS — R413 Other amnesia: Secondary | ICD-10-CM | POA: Diagnosis not present

## 2021-06-15 DIAGNOSIS — J449 Chronic obstructive pulmonary disease, unspecified: Secondary | ICD-10-CM | POA: Diagnosis not present

## 2021-06-17 DIAGNOSIS — F32A Depression, unspecified: Secondary | ICD-10-CM | POA: Diagnosis not present

## 2021-06-17 DIAGNOSIS — E669 Obesity, unspecified: Secondary | ICD-10-CM | POA: Diagnosis not present

## 2021-06-17 DIAGNOSIS — G609 Hereditary and idiopathic neuropathy, unspecified: Secondary | ICD-10-CM | POA: Diagnosis not present

## 2021-06-17 DIAGNOSIS — M1711 Unilateral primary osteoarthritis, right knee: Secondary | ICD-10-CM | POA: Diagnosis not present

## 2021-06-17 DIAGNOSIS — Z79899 Other long term (current) drug therapy: Secondary | ICD-10-CM | POA: Diagnosis not present

## 2021-06-17 DIAGNOSIS — E11621 Type 2 diabetes mellitus with foot ulcer: Secondary | ICD-10-CM | POA: Diagnosis not present

## 2021-06-17 DIAGNOSIS — L89893 Pressure ulcer of other site, stage 3: Secondary | ICD-10-CM | POA: Diagnosis not present

## 2021-06-17 DIAGNOSIS — L03031 Cellulitis of right toe: Secondary | ICD-10-CM | POA: Diagnosis not present

## 2021-06-25 DIAGNOSIS — F419 Anxiety disorder, unspecified: Secondary | ICD-10-CM | POA: Diagnosis not present

## 2021-06-25 DIAGNOSIS — R030 Elevated blood-pressure reading, without diagnosis of hypertension: Secondary | ICD-10-CM | POA: Diagnosis not present

## 2021-06-25 DIAGNOSIS — E1140 Type 2 diabetes mellitus with diabetic neuropathy, unspecified: Secondary | ICD-10-CM | POA: Diagnosis not present

## 2021-06-25 DIAGNOSIS — F32A Depression, unspecified: Secondary | ICD-10-CM | POA: Diagnosis not present

## 2021-06-25 DIAGNOSIS — Z79899 Other long term (current) drug therapy: Secondary | ICD-10-CM | POA: Diagnosis not present

## 2021-06-27 DIAGNOSIS — H5213 Myopia, bilateral: Secondary | ICD-10-CM | POA: Diagnosis not present

## 2021-06-27 DIAGNOSIS — H52209 Unspecified astigmatism, unspecified eye: Secondary | ICD-10-CM | POA: Diagnosis not present

## 2021-06-27 DIAGNOSIS — H524 Presbyopia: Secondary | ICD-10-CM | POA: Diagnosis not present

## 2021-07-08 DIAGNOSIS — L03031 Cellulitis of right toe: Secondary | ICD-10-CM | POA: Diagnosis not present

## 2021-07-08 DIAGNOSIS — E11621 Type 2 diabetes mellitus with foot ulcer: Secondary | ICD-10-CM | POA: Diagnosis not present

## 2021-07-08 DIAGNOSIS — L89893 Pressure ulcer of other site, stage 3: Secondary | ICD-10-CM | POA: Diagnosis not present

## 2021-07-12 DIAGNOSIS — E785 Hyperlipidemia, unspecified: Secondary | ICD-10-CM | POA: Diagnosis not present

## 2021-07-12 DIAGNOSIS — K219 Gastro-esophageal reflux disease without esophagitis: Secondary | ICD-10-CM | POA: Diagnosis not present

## 2021-07-12 DIAGNOSIS — E1121 Type 2 diabetes mellitus with diabetic nephropathy: Secondary | ICD-10-CM | POA: Diagnosis not present

## 2021-07-12 DIAGNOSIS — I10 Essential (primary) hypertension: Secondary | ICD-10-CM | POA: Diagnosis not present

## 2021-07-12 DIAGNOSIS — E669 Obesity, unspecified: Secondary | ICD-10-CM | POA: Diagnosis not present

## 2021-07-12 DIAGNOSIS — I2510 Atherosclerotic heart disease of native coronary artery without angina pectoris: Secondary | ICD-10-CM | POA: Diagnosis not present

## 2021-07-15 DIAGNOSIS — Z79899 Other long term (current) drug therapy: Secondary | ICD-10-CM | POA: Diagnosis not present

## 2021-07-15 DIAGNOSIS — J449 Chronic obstructive pulmonary disease, unspecified: Secondary | ICD-10-CM | POA: Diagnosis not present

## 2021-07-15 DIAGNOSIS — M1711 Unilateral primary osteoarthritis, right knee: Secondary | ICD-10-CM | POA: Diagnosis not present

## 2021-07-15 DIAGNOSIS — E669 Obesity, unspecified: Secondary | ICD-10-CM | POA: Diagnosis not present

## 2021-07-15 DIAGNOSIS — G609 Hereditary and idiopathic neuropathy, unspecified: Secondary | ICD-10-CM | POA: Diagnosis not present

## 2021-07-15 DIAGNOSIS — F32A Depression, unspecified: Secondary | ICD-10-CM | POA: Diagnosis not present

## 2021-07-16 DIAGNOSIS — K219 Gastro-esophageal reflux disease without esophagitis: Secondary | ICD-10-CM | POA: Diagnosis not present

## 2021-07-29 ENCOUNTER — Ambulatory Visit: Payer: Self-pay | Admitting: Surgery

## 2021-07-29 DIAGNOSIS — L89893 Pressure ulcer of other site, stage 3: Secondary | ICD-10-CM | POA: Diagnosis not present

## 2021-07-29 DIAGNOSIS — E11621 Type 2 diabetes mellitus with foot ulcer: Secondary | ICD-10-CM | POA: Diagnosis not present

## 2021-07-29 DIAGNOSIS — L03031 Cellulitis of right toe: Secondary | ICD-10-CM | POA: Diagnosis not present

## 2021-07-31 ENCOUNTER — Ambulatory Visit: Payer: Medicare HMO | Admitting: Surgery

## 2021-07-31 ENCOUNTER — Encounter: Payer: Self-pay | Admitting: Surgery

## 2021-07-31 ENCOUNTER — Other Ambulatory Visit: Payer: Self-pay

## 2021-07-31 DIAGNOSIS — K449 Diaphragmatic hernia without obstruction or gangrene: Secondary | ICD-10-CM | POA: Diagnosis not present

## 2021-07-31 DIAGNOSIS — N1831 Chronic kidney disease, stage 3a: Secondary | ICD-10-CM | POA: Diagnosis not present

## 2021-07-31 NOTE — Patient Instructions (Addendum)
Your CT is scheduled for August 08, 2021 @ 9 am (arrive at 8:45 am) at [facility]. Nothing to eat or drink 4 hours prior. Please pick up contrast at any [facility] Radiology department between now and the day before your tests. Your Barium Swallow is scheduled for August 08, 2021 @ 10:30 am (arrive at 10 am) at [facility].    Please get your labs drawn before having the CT.   If you have any concerns or questions, please feel free to call our office.   Hiatal Hernia   A hiatal hernia occurs when part of the stomach slides above the muscle that separates the abdomen from the chest (diaphragm). A person can be born with a hiatal hernia (congenital), or it may develop over time. In almost all cases of hiatal hernia, only thetop part of the stomach pushes through the diaphragm. Many people have a hiatal hernia with no symptoms. The larger the hernia, the more likely it is that you will have symptoms. In some cases, a hiatal hernia allows stomach acid to flow back into the tube that carries food from your mouth to your stomach (esophagus). This may cause heartburn symptoms. Severe heartburn symptoms may mean thatyou have developed a condition called gastroesophageal reflux disease (GERD). What are the causes? This condition is caused by a weakness in the opening (hiatus) where the esophagus passes through the diaphragm to attach to the upper part of the stomach. A person may be born with a weakness in the hiatus, or aweakness can develop over time. What increases the risk? This condition is more likely to develop in: Older people. Age is a major risk factor for a hiatal hernia, especially if you are over the age of 36. Pregnant women. People who are overweight. People who have frequent constipation. What are the signs or symptoms? Symptoms of this condition usually develop in the form of GERD symptoms. Symptoms include: Heartburn. Belching. Indigestion. Trouble  swallowing. Coughing or wheezing. Sore throat. Hoarseness. Chest pain. Nausea and vomiting. How is this diagnosed? This condition may be diagnosed during testing for GERD. Tests that may be done include: X-rays of your stomach or chest. An upper gastrointestinal (GI) series. This is an X-ray exam of your GI tract that is taken after you swallow a chalky liquid that shows up clearly on the X-ray. Endoscopy. This is a procedure to look into your stomach using a thin, flexible tube that has a tiny camera and light on the end of it. How is this treated? This condition may be treated by: Dietary and lifestyle changes to help reduce GERD symptoms. Medicines. These may include: Over-the-counter antacids. Medicines that make your stomach empty more quickly. Medicines that block the production of stomach acid (H2 blockers). Stronger medicines to reduce stomach acid (proton pump inhibitors). Surgery to repair the hernia, if other treatments are not helping. If you have no symptoms, you may not need treatment. Follow these instructions at home: Lifestyle and activity Do not use any products that contain nicotine or tobacco, such as cigarettes and e-cigarettes. If you need help quitting, ask your health care provider. Try to achieve and maintain a healthy body weight. Avoid putting pressure on your abdomen. Anything that puts pressure on your abdomen increases the amount of acid that may be pushed up into your esophagus. Avoid bending over, especially after eating. Raise the head of your bed by putting blocks under the legs. This keeps your head and esophagus higher than your  stomach. Do not wear tight clothing around your chest or stomach. Try not to strain when having a bowel movement, when urinating, or when lifting heavy objects. Eating and drinking Avoid foods that can worsen GERD symptoms. These may include: Fatty foods, like fried foods. Citrus fruits, like oranges or lemon. Other foods  and drinks that contain acid, like orange juice or tomatoes. Spicy food. Chocolate. Eat frequent small meals instead of three large meals a day. This helps prevent your stomach from getting too full. Eat slowly. Do not lie down right after eating. Do not eat 1-2 hours before bed. Do not drink beverages with caffeine. These include cola, coffee, cocoa, and tea. Do not drink alcohol. General instructions Take over-the-counter and prescription medicines only as told by your health care provider. Keep all follow-up visits as told by your health care provider. This is important. Contact a health care provider if: Your symptoms are not controlled with medicines or lifestyle changes. You are having trouble swallowing. You have coughing or wheezing that will not go away. Get help right away if: Your pain is getting worse. Your pain spreads to your arms, neck, jaw, teeth, or back. You have shortness of breath. You sweat for no reason. You feel sick to your stomach (nauseous) or you vomit. You vomit blood. You have bright red blood in your stools. You have black, tarry stools. Summary A hiatal hernia occurs when part of the stomach slides above the muscle that separates the abdomen from the chest (diaphragm). A person may be born with a weakness in the hiatus, or a weakness can develop over time. Symptoms of hiatal hernia may include heartburn, trouble swallowing, or sore throat. Management of hiatal hernia includes eating frequent small meals instead of three large meals a day. Get help right away if you vomit blood, have bright red blood in your stools, or have black, tarry stools. This information is not intended to replace advice given to you by your health care provider. Make sure you discuss any questions you have with your healthcare provider. Document Revised: 11/15/2020 Document Reviewed: 11/15/2020 Elsevier Patient Education  2022 [employer].

## 2021-07-31 NOTE — Progress Notes (Signed)
Patient ID: Chris Mercer, male   DOB: 1946/11/06, 75 y.o.   MRN: [id number]  HPI Chris Mercer is a 75 y.o. male seen in consultation at the request of Dr. Lanney Gins for symptomatic hiatal hernia.  He does have a significant history of COPD with dyspnea on exertion.  He wears 2 L of oxygen at night Does report intermittent reflux and cough.  Reflux seems to be partially controlled with PPI. He has had an eventful couple years with prior hospitalization secondary to pulmonary issues.  He seems to be recovering from that perspective and is a stable from his COPD.  He does have dyspnea on exertion and uses inhalers and oxygen at night.Chest x-ray personally reviewed shows a hiatal hernia with also evidence of emphysema.  I have personally all the records extensively. CBC is normal and BMP shows a creatinine of 1.9.  Rest of it is normal except sugar of 241.  His lab work is from 6 months ago. He has had umbilical hernia repair with mesh and inguinal hernias in the remote past.  He also had had a cardiac stent before. Lives on his own with his dog but does have his son that lives close by  HPI  Past Medical History:  Diagnosis Date   Diabetes mellitus without complication ([place])    Hypertension    Neuropathy     Past Surgical History:  Procedure Laterality Date   APPENDECTOMY     CORONARY ANGIOPLASTY WITH STENT PLACEMENT     HERNIA REPAIR     x3    Family History  Problem Relation Age of Onset   Heart disease Father     Social History Social History   Tobacco Use   Smoking status: Never   Smokeless tobacco: Never  Vaping Use   Vaping Use: Never used  Substance Use Topics   Alcohol use: No   Drug use: Never    Allergies  Allergen Reactions   Azithromycin Anaphylaxis, Swelling and Hives   Fenoprofen Anaphylaxis   Duloxetine Diarrhea    With dark stools       Current Outpatient Medications  Medication Sig Dispense Refill   ALPRAZolam (XANAX) 0.25 MG tablet Take by  mouth.     ARIPiprazole (ABILIFY) 15 MG tablet      budesonide (PULMICORT) 0.25 MG/2ML nebulizer solution Inhale into the lungs.     busPIRone (BUSPAR) 7.5 MG tablet Take 1 tablet (7.5 mg total) by mouth 2 (two) times daily. 60 tablet 1   Cholecalciferol 1.25 MG (50000 UT) TABS Take by mouth.     diclofenac Sodium (VOLTAREN) 1 % GEL USING DOSING CARD APPLY 1 GRAM OF GEL TOPICALLY TO BILATERAL KNEES EVERY 8 HOURS     doxepin (SINEQUAN) 10 MG capsule Take by mouth.     FLUoxetine (PROZAC) 10 MG capsule Take 1 capsule (10 mg total) by mouth daily. 30 capsule 1   Fluticasone-Umeclidin-Vilant (TRELEGY ELLIPTA) 100-62.5-25 MCG/INH AEPB      gabapentin (NEURONTIN) 600 MG tablet Take by mouth.     hydrocortisone 2.5 % cream      lisinopril (PRINIVIL,ZESTRIL) 40 MG tablet Take 40 mg by mouth daily.     metFORMIN (GLUCOPHAGE) 500 MG tablet Take by mouth.     naloxone (NARCAN) nasal spray 4 mg/0.1 mL      oxyCODONE (OXY IR/ROXICODONE) 5 MG immediate release tablet Take 1 tablet (5 mg total) by mouth every 6 (six) hours as needed for severe pain or breakthrough pain. 20 tablet 0  pantoprazole (PROTONIX) 40 MG tablet Take 40 mg by mouth daily. FOR 14 DAYS     prazosin (MINIPRESS) 2 MG capsule Take 2 mg by mouth at bedtime.     rosuvastatin (CRESTOR) 40 MG tablet Take by mouth.     sucralfate (CARAFATE) 1 g tablet TAKE 1 TABLET THREE TIMES A DAY BEFORE MEALS, DISSOLVE TABLET IN 1 TABLESPOON OF WATER TO MAKE A SLURRY , HOLD VITAMIND     tiotropium (SPIRIVA HANDIHALER) 18 MCG inhalation capsule Place into inhaler and inhale.     No current facility-administered medications for this visit.     Review of Systems Full ROS  was asked and was negative except for the information on the HPI  Physical Exam Blood pressure 138/69, pulse 99, temperature 98.8 F (37.1 C), temperature source Oral, height 6' (1.829 m), weight 237 lb 12.8 oz (107.9 kg), SpO2 91 %. CONSTITUTIONAL: NAD EYES: Pupils are equal, round,   Sclera are non-icteric. EARS, NOSE, MOUTH AND THROAT:  He is wearing am mask Hearing is intact to voice. LYMPH NODES:  Lymph nodes in the neck are normal. RESPIRATORY:  Lungs are decreased on bases There is normal respiratory effort, with equal breath sounds bilaterally, and without pathologic use of accessory muscles. CARDIOVASCULAR: Heart is regular without murmurs, gallops, or rubs. GI: The abdomen is  soft, nontender, and nondistended. There are no palpable masses. There is no hepatosplenomegaly. There are normal bowel sounds .  There is prior evidence of umbilical scar but no evidence of ventral hernias.  He does have diastases recti  GU: Rectal deferred.   MUSCULOSKELETAL: Normal muscle strength and tone. No cyanosis or edema.   SKIN: Turgor is good and there are no pathologic skin lesions or ulcers. NEUROLOGIC: Motor and sensation is grossly normal. Cranial nerves are grossly intact. PSYCH:  Oriented to person, place and time. Affect is normal.  Data Reviewed  I have personally reviewed the patient's imaging, laboratory findings and medical records.    Assessment/Plan 75 year old male with significant COPD and ILD for hernia that is symptomatic causing reflux probably worsening his pulmonary condition.  I do think that it is worth pursuing further work-up we will start with a CT scan of the abdomen pelvis as well as a barium swallow and endoscopic evaluation.  We will also make sure he has current labs and will order a CMP and a CBC as well.  We will see him back in a few weeks after he completes the work-up.  I have also discussed with him about his disease process and the options of surgical intervention to repair his hiatal hernia.  I do think that he will be a good candidate for robotic approach.  We discussed the approach.  The risk benefits and possible complications as well as the postoperative course.  Extensive counseling provided. A copy of this report was sent to the referring  provider   Time spent in this encounter was 60 minutes, with more than 50% of the time spent in face-to-face education, counseling and care coordination.     Caroleen Hamman, MD FACS General Surgeon 07/31/2021, 10:50 AM

## 2021-08-08 ENCOUNTER — Ambulatory Visit (HOSPITAL_COMMUNITY)
Admission: RE | Admit: 2021-08-08 | Discharge: 2021-08-08 | Disposition: A | Discharge status: Home / Self Care | Payer: Medicare HMO | Source: Ambulatory Visit | Attending: Surgery | Admitting: Surgery

## 2021-08-08 ENCOUNTER — Encounter (HOSPITAL_COMMUNITY): Payer: Self-pay

## 2021-08-08 ENCOUNTER — Other Ambulatory Visit: Payer: Self-pay

## 2021-08-08 DIAGNOSIS — K760 Fatty (change of) liver, not elsewhere classified: Secondary | ICD-10-CM | POA: Diagnosis not present

## 2021-08-08 DIAGNOSIS — K224 Dyskinesia of esophagus: Secondary | ICD-10-CM | POA: Diagnosis not present

## 2021-08-08 DIAGNOSIS — K449 Diaphragmatic hernia without obstruction or gangrene: Secondary | ICD-10-CM

## 2021-08-08 LAB — POCT I-STAT CREATININE: Creatinine, Ser: 1.1 mg/dL (ref 0.61–1.24)

## 2021-08-08 MED ORDER — IOHEXOL 350 MG/ML SOLN
80.0000 mL | Freq: Once | INTRAVENOUS | Status: AC | PRN
  Administered 2021-08-08: 80 mL via INTRAVENOUS

## 2021-08-09 ENCOUNTER — Telehealth: Payer: Self-pay

## 2021-08-09 NOTE — Telephone Encounter (Signed)
Called and spoke with the patient and let him know per Dr Dahlia Byes that his swallowing study just showed a large hiatal hernia. He will follow up next Wednesday with  Dr Dahlia Byes.

## 2021-08-12 DIAGNOSIS — R600 Localized edema: Secondary | ICD-10-CM | POA: Diagnosis not present

## 2021-08-12 DIAGNOSIS — I10 Essential (primary) hypertension: Secondary | ICD-10-CM | POA: Diagnosis not present

## 2021-08-12 DIAGNOSIS — N1831 Chronic kidney disease, stage 3a: Secondary | ICD-10-CM | POA: Diagnosis not present

## 2021-08-14 ENCOUNTER — Ambulatory Visit (INDEPENDENT_AMBULATORY_CARE_PROVIDER_SITE_OTHER): Payer: Medicare HMO | Admitting: Surgery

## 2021-08-14 ENCOUNTER — Encounter: Payer: Self-pay | Admitting: Surgery

## 2021-08-14 ENCOUNTER — Other Ambulatory Visit: Payer: Self-pay

## 2021-08-14 VITALS — BP 115/76 | HR 94 | Temp 98.4°F | Ht 72.0 in | Wt 232.0 lb

## 2021-08-14 DIAGNOSIS — K449 Diaphragmatic hernia without obstruction or gangrene: Secondary | ICD-10-CM

## 2021-08-14 NOTE — Patient Instructions (Addendum)
Keep your appointment with [facility] GI on August 31st at 2:00 pm. to discuss having an Upper Endoscopy scope done.   Kathline Magic, MD Gastroenterology [address] [place] [address]   Phone: [phone]   We will need this done before we can do surgery on you.  Follow up here in 3 weeks or after you have this scope done. Then we will discuss surgery.

## 2021-08-15 DIAGNOSIS — J449 Chronic obstructive pulmonary disease, unspecified: Secondary | ICD-10-CM | POA: Diagnosis not present

## 2021-08-16 NOTE — Progress Notes (Signed)
Outpatient Surgical Follow Up  08/16/2021  Chris Mercer is an 75 y.o. male.   Chief Complaint  Patient presents with   Follow-up    HPI: Chris Mercer is a 75 year old male known to me with a paraesophageal hernia.  He does have significant reflux and likely microaspiration.  He does have a history of COPD and his hiatal hernia is exacerbating his pulmonary symptoms.  He did complete a CT scan of the abdomen pelvis that I personally reviewed showing evidence of type III paraesophageal hernia moderate to large in size.  No evidence of any other acute intra-abdominal abnormalities other than a left asymptomatic inguinal hernia.  He is well evaluation showed no evidence of any significant intraluminal issues but he does have some presbyesophagus but clinically not evident at this time  Past Medical History:  Diagnosis Date   Diabetes mellitus without complication ([facility])    Hypertension    Neuropathy     Past Surgical History:  Procedure Laterality Date   APPENDECTOMY     CORONARY ANGIOPLASTY WITH STENT PLACEMENT     HERNIA REPAIR     x3    Family History  Problem Relation Age of Onset   Heart disease Father     Social History:  reports that he has never smoked. He has never used smokeless tobacco. He reports that he does not drink alcohol and does not use drugs.  Allergies:  Allergies  Allergen Reactions   Azithromycin Anaphylaxis, Swelling and Hives   Fenoprofen Anaphylaxis   Duloxetine Diarrhea    With dark stools       Medications reviewed.    ROS Full ROS performed and is otherwise negative other than what is stated in HPI   BP 115/76   Pulse 94   Temp 98.4 F (36.9 C)   Ht 6' (1.829 m)   Wt 232 lb (105.2 kg)   SpO2 98%   BMI 31.46 kg/m   Physical Exam CONSTITUTIONAL: NAD EYES: Pupils are equal, round,  Sclera are non-icteric. EARS, NOSE, MOUTH AND THROAT:  He is wearing am mask Hearing is intact to voice. LYMPH NODES:  Lymph nodes in the neck are  normal. RESPIRATORY:  Lungs are decreased on bases There is normal respiratory effort, with equal breath sounds bilaterally, and without pathologic use of accessory muscles. CARDIOVASCULAR: Heart is regular without murmurs, gallops, or rubs. GI: The abdomen is  soft, nontender, and nondistended. There are no palpable masses. There is no hepatosplenomegaly. There are normal bowel sounds .  There is prior evidence of umbilical scar but no evidence of ventral hernias.  He does have diastases recti MUSCULOSKELETAL: Normal muscle strength and tone. No cyanosis or edema.   SKIN: Turgor is good and there are no pathologic skin lesions or ulcers. NEUROLOGIC: Motor and sensation is grossly normal. Cranial nerves are grossly intact. PSYCH:  Oriented to person, place and time. Affect is normal.      No results found for this or any previous visit (from the past 48 hour(s)). No results found.  Assessment/Plan: 75 year old male with large paraesophageal hernia causing symptoms.  Discussed with the patient in detail about his disease process.  I personally reviewed the images and also shared the images with him.  He is in agreement with proceeding with surgical intervention.  Prior to definitive repair I would like to obtain an endoscopic evaluation that he does have an appointment in the next week or so.  He does have a left inguinal hernia that is asymptomatic  at this time and I do not necessarily recommend repair.  We will follow him up promptly after he completes endoscopic evaluation Regarding type of fundoplication we may need to do partial depending on endoscopic and clinical findings   Greater than 50% of the 30 minutes  visit was spent in counseling/coordination of care   Caroleen Hamman, MD [place] Surgeon

## 2021-08-19 DIAGNOSIS — G8929 Other chronic pain: Secondary | ICD-10-CM | POA: Diagnosis not present

## 2021-08-19 DIAGNOSIS — E11621 Type 2 diabetes mellitus with foot ulcer: Secondary | ICD-10-CM | POA: Diagnosis not present

## 2021-08-19 DIAGNOSIS — M1711 Unilateral primary osteoarthritis, right knee: Secondary | ICD-10-CM | POA: Diagnosis not present

## 2021-08-19 DIAGNOSIS — L89893 Pressure ulcer of other site, stage 3: Secondary | ICD-10-CM | POA: Diagnosis not present

## 2021-08-19 DIAGNOSIS — G609 Hereditary and idiopathic neuropathy, unspecified: Secondary | ICD-10-CM | POA: Diagnosis not present

## 2021-08-19 DIAGNOSIS — L03031 Cellulitis of right toe: Secondary | ICD-10-CM | POA: Diagnosis not present

## 2021-08-19 DIAGNOSIS — F32A Depression, unspecified: Secondary | ICD-10-CM | POA: Diagnosis not present

## 2021-08-19 DIAGNOSIS — Z79899 Other long term (current) drug therapy: Secondary | ICD-10-CM | POA: Diagnosis not present

## 2021-08-19 DIAGNOSIS — E669 Obesity, unspecified: Secondary | ICD-10-CM | POA: Diagnosis not present

## 2021-08-20 DIAGNOSIS — F330 Major depressive disorder, recurrent, mild: Secondary | ICD-10-CM | POA: Diagnosis not present

## 2021-08-20 DIAGNOSIS — F411 Generalized anxiety disorder: Secondary | ICD-10-CM | POA: Diagnosis not present

## 2021-08-23 DIAGNOSIS — E6609 Other obesity due to excess calories: Secondary | ICD-10-CM | POA: Diagnosis not present

## 2021-08-23 DIAGNOSIS — E785 Hyperlipidemia, unspecified: Secondary | ICD-10-CM | POA: Diagnosis not present

## 2021-08-23 DIAGNOSIS — E1122 Type 2 diabetes mellitus with diabetic chronic kidney disease: Secondary | ICD-10-CM | POA: Diagnosis not present

## 2021-08-23 DIAGNOSIS — I10 Essential (primary) hypertension: Secondary | ICD-10-CM | POA: Diagnosis not present

## 2021-08-27 DIAGNOSIS — F410 Panic disorder [episodic paroxysmal anxiety] without agoraphobia: Secondary | ICD-10-CM | POA: Diagnosis not present

## 2021-08-27 DIAGNOSIS — E538 Deficiency of other specified B group vitamins: Secondary | ICD-10-CM | POA: Diagnosis not present

## 2021-08-27 DIAGNOSIS — E1122 Type 2 diabetes mellitus with diabetic chronic kidney disease: Secondary | ICD-10-CM | POA: Diagnosis not present

## 2021-08-27 DIAGNOSIS — I10 Essential (primary) hypertension: Secondary | ICD-10-CM | POA: Diagnosis not present

## 2021-08-27 DIAGNOSIS — E6609 Other obesity due to excess calories: Secondary | ICD-10-CM | POA: Diagnosis not present

## 2021-08-27 DIAGNOSIS — E785 Hyperlipidemia, unspecified: Secondary | ICD-10-CM | POA: Diagnosis not present

## 2021-08-28 DIAGNOSIS — K449 Diaphragmatic hernia without obstruction or gangrene: Secondary | ICD-10-CM | POA: Diagnosis not present

## 2021-08-28 DIAGNOSIS — K219 Gastro-esophageal reflux disease without esophagitis: Secondary | ICD-10-CM | POA: Diagnosis not present

## 2021-09-03 ENCOUNTER — Encounter: Admission: RE | Payer: Self-pay | Source: Home / Self Care

## 2021-09-03 ENCOUNTER — Ambulatory Visit: Admission: RE | Admit: 2021-09-03 | Payer: Medicare HMO | Source: Home / Self Care

## 2021-09-03 SURGERY — ESOPHAGOGASTRODUODENOSCOPY (EGD) WITH PROPOFOL
Anesthesia: General

## 2021-09-04 ENCOUNTER — Encounter: Payer: Self-pay | Admitting: *Deleted

## 2021-09-05 ENCOUNTER — Ambulatory Visit
Admission: RE | Admit: 2021-09-05 | Discharge: 2021-09-05 | Disposition: A | Discharge status: Home / Self Care | Payer: Medicare HMO | Attending: Gastroenterology | Admitting: Gastroenterology

## 2021-09-05 ENCOUNTER — Encounter
Admission: RE | Disposition: A | Discharge status: Home / Self Care | Payer: Self-pay | Source: Home / Self Care | Attending: Gastroenterology

## 2021-09-05 ENCOUNTER — Ambulatory Visit: Payer: Medicare HMO | Admitting: Anesthesiology

## 2021-09-05 ENCOUNTER — Encounter: Payer: Self-pay | Admitting: Anesthesiology

## 2021-09-05 DIAGNOSIS — R1312 Dysphagia, oropharyngeal phase: Secondary | ICD-10-CM | POA: Insufficient documentation

## 2021-09-05 DIAGNOSIS — D131 Benign neoplasm of stomach: Secondary | ICD-10-CM | POA: Diagnosis not present

## 2021-09-05 DIAGNOSIS — Z79899 Other long term (current) drug therapy: Secondary | ICD-10-CM | POA: Insufficient documentation

## 2021-09-05 DIAGNOSIS — Z7951 Long term (current) use of inhaled steroids: Secondary | ICD-10-CM | POA: Insufficient documentation

## 2021-09-05 DIAGNOSIS — Z87891 Personal history of nicotine dependence: Secondary | ICD-10-CM | POA: Insufficient documentation

## 2021-09-05 DIAGNOSIS — Z886 Allergy status to analgesic agent status: Secondary | ICD-10-CM | POA: Diagnosis not present

## 2021-09-05 DIAGNOSIS — E785 Hyperlipidemia, unspecified: Secondary | ICD-10-CM | POA: Diagnosis not present

## 2021-09-05 DIAGNOSIS — K2270 Barrett's esophagus without dysplasia: Secondary | ICD-10-CM | POA: Insufficient documentation

## 2021-09-05 DIAGNOSIS — K449 Diaphragmatic hernia without obstruction or gangrene: Secondary | ICD-10-CM | POA: Diagnosis not present

## 2021-09-05 DIAGNOSIS — Z955 Presence of coronary angioplasty implant and graft: Secondary | ICD-10-CM | POA: Insufficient documentation

## 2021-09-05 DIAGNOSIS — Z881 Allergy status to other antibiotic agents status: Secondary | ICD-10-CM | POA: Insufficient documentation

## 2021-09-05 DIAGNOSIS — R1110 Vomiting, unspecified: Secondary | ICD-10-CM | POA: Diagnosis not present

## 2021-09-05 DIAGNOSIS — Z89429 Acquired absence of other toe(s), unspecified side: Secondary | ICD-10-CM | POA: Diagnosis not present

## 2021-09-05 DIAGNOSIS — Z888 Allergy status to other drugs, medicaments and biological substances status: Secondary | ICD-10-CM | POA: Diagnosis not present

## 2021-09-05 DIAGNOSIS — K219 Gastro-esophageal reflux disease without esophagitis: Secondary | ICD-10-CM | POA: Diagnosis not present

## 2021-09-05 DIAGNOSIS — Z7984 Long term (current) use of oral hypoglycemic drugs: Secondary | ICD-10-CM | POA: Diagnosis not present

## 2021-09-05 DIAGNOSIS — K229 Disease of esophagus, unspecified: Secondary | ICD-10-CM | POA: Diagnosis not present

## 2021-09-05 DIAGNOSIS — R0602 Shortness of breath: Secondary | ICD-10-CM | POA: Diagnosis not present

## 2021-09-05 DIAGNOSIS — K3189 Other diseases of stomach and duodenum: Secondary | ICD-10-CM | POA: Diagnosis not present

## 2021-09-05 HISTORY — DX: Hyperlipidemia, unspecified: E78.5

## 2021-09-05 HISTORY — PX: ESOPHAGOGASTRODUODENOSCOPY (EGD) WITH PROPOFOL: SHX5813

## 2021-09-05 LAB — GLUCOSE, CAPILLARY: Glucose-Capillary: 127 mg/dL — ABNORMAL HIGH (ref 70–99)

## 2021-09-05 SURGERY — ESOPHAGOGASTRODUODENOSCOPY (EGD) WITH PROPOFOL
Anesthesia: General

## 2021-09-05 MED ORDER — LIDOCAINE HCL (CARDIAC) PF 100 MG/5ML IV SOSY
PREFILLED_SYRINGE | INTRAVENOUS | Status: DC | PRN
  Administered 2021-09-05: 100 mg via INTRAVENOUS

## 2021-09-05 MED ORDER — PROPOFOL 10 MG/ML IV BOLUS
INTRAVENOUS | Status: DC | PRN
  Administered 2021-09-05: 70 mg via INTRAVENOUS
  Administered 2021-09-05: 10 mg via INTRAVENOUS

## 2021-09-05 MED ORDER — PROPOFOL 10 MG/ML IV BOLUS
INTRAVENOUS | Status: AC
  Filled 2021-09-05: qty 20

## 2021-09-05 MED ORDER — PROPOFOL 500 MG/50ML IV EMUL
INTRAVENOUS | Status: DC | PRN
  Administered 2021-09-05: 150 ug/kg/min via INTRAVENOUS

## 2021-09-05 MED ORDER — SODIUM CHLORIDE 0.9 % IV SOLN
INTRAVENOUS | Status: DC
  Administered 2021-09-05: 1000 mL via INTRAVENOUS

## 2021-09-05 MED ORDER — PHENYLEPHRINE HCL (PRESSORS) 10 MG/ML IV SOLN
INTRAVENOUS | Status: DC | PRN
  Administered 2021-09-05 (×2): 100 ug via INTRAVENOUS

## 2021-09-05 NOTE — Anesthesia Postprocedure Evaluation (Signed)
Anesthesia Post Note  Patient: Chris Mercer  Procedure(s) Performed: ESOPHAGOGASTRODUODENOSCOPY (EGD) WITH PROPOFOL  Patient location during evaluation: Endoscopy Anesthesia Type: General Level of consciousness: awake and alert Pain management: pain level controlled Vital Signs Assessment: post-procedure vital signs reviewed and stable Respiratory status: spontaneous breathing, nonlabored ventilation and respiratory function stable Cardiovascular status: blood pressure returned to baseline and stable Postop Assessment: no apparent nausea or vomiting Anesthetic complications: no   No notable events documented.   Last Vitals:  Vitals:   09/05/21 1229 09/05/21 1239  BP: (!) 131/105 133/79  Pulse: (!) 56 (!) 50  Resp: 19 14  Temp:    SpO2: 98% 99%    Last Pain:  Vitals:   09/05/21 1239  TempSrc:   PainSc: 0-No pain                 [place] Ouch

## 2021-09-05 NOTE — Op Note (Signed)
[facility] Gastroenterology Patient Name: Chris Mercer Procedure Date: 09/05/2021 11:38 AM MRN: [id number] Account #: [id number] Date of Birth: [birth year] Admit Type: Outpatient Age: 75 Room: [facility] ENDO ROOM 1 Gender: Male Note Status: Finalized Instrument Name: Michaelle Birks [id number] Procedure:             Upper GI endoscopy Indications:           Suspected esophageal reflux, Hiatal hernia Providers:             [name] DO, DO Referring MD:          No Local Md, MD (Referring MD) Medicines:             Monitored Anesthesia Care Complications:         No immediate complications. Estimated blood loss:                         Minimal. Procedure:             Pre-Anesthesia Assessment:                        - Prior to the procedure, a History and Physical was                         performed, and patient medications and allergies were                         reviewed. The patient is competent. The risks and                         benefits of the procedure and the sedation options and                         risks were discussed with the patient. All questions                         were answered and informed consent was obtained.                         Patient identification and proposed procedure were                         verified by the physician, the nurse, the anesthetist                         and the technician in the endoscopy suite. Mental                         Status Examination: alert and oriented. Airway                         Examination: normal oropharyngeal airway and neck                         mobility. Respiratory Examination: clear to                         auscultation. CV Examination: RRR, no murmurs, no S3  or S4. Prophylactic Antibiotics: The patient does not                         require prophylactic antibiotics. Prior                         Anticoagulants: The patient has taken no previous                          anticoagulant or antiplatelet agents. ASA Grade                         Assessment: III - A patient with severe systemic                         disease. After reviewing the risks and benefits, the                         patient was deemed in satisfactory condition to                         undergo the procedure. The anesthesia plan was to use                         monitored anesthesia care (MAC). Immediately prior to                         administration of medications, the patient was                         re-assessed for adequacy to receive sedatives. The                         heart rate, respiratory rate, oxygen saturations,                         blood pressure, adequacy of pulmonary ventilation, and                         response to care were monitored throughout the                         procedure. The physical status of the patient was                         re-assessed after the procedure.                        After obtaining informed consent, the endoscope was                         passed under direct vision. Throughout the procedure,                         the patient's blood pressure, pulse, and oxygen                         saturations were monitored continuously. The Endoscope  was introduced through the mouth, and advanced to the                         second part of duodenum. The upper GI endoscopy was                         accomplished without difficulty. The patient tolerated                         the procedure well. Findings:      The duodenal bulb, first portion of the duodenum and second portion of       the duodenum were normal. Estimated blood loss: none.      A 10 cm hiatal hernia was present. Estimated blood loss: none.      There were esophageal mucosal changes suspicious for long-segment       Barrett's esophagus, classified as Barrett's stage C10-M10 per [place]       criteria present in  the mid esophagus and in the distal esophagus. The       maximum longitudinal extent of these mucosal changes was 10 cm in       length. Mucosa was biopsied with a cold forceps for histology in 4       quadrants at intervals of 2 cm from 27 to 37 cm from the incisors. A       total of 7 specimen bottles were sent to pathology. Estimated blood loss       was minimal. Nodule noted at 34 cm from incisors was also biopsied with       cold forceps in setting of barrett's esophagus Estimated blood loss was       minimal.      The exam of the esophagus was otherwise normal. Impression:            - Normal duodenal bulb, first portion of the duodenum                         and second portion of the duodenum.                        - 10 cm hiatal hernia.                        - Esophageal mucosal changes suspicious for                         long-segment Barrett's esophagus, classified as                         Barrett's stage C10-M10 per [place] criteria. Biopsied. Recommendation:        - Discharge patient to home.                        - Resume previous diet.                        - Continue present medications.                        - Await pathology results.                        -  Repeat upper endoscopy for surveillance based on                         pathology results.                        - Return to referring physician as previously                         scheduled.                        Continue twice daily proton pump inhibitor Procedure Code(s):     --- Professional ---                        [phone], Esophagogastroduodenoscopy, flexible,                         transoral; with biopsy, single or multiple Diagnosis Code(s):     --- Professional ---                        K22.70, Barrett's esophagus without dysplasia                        K44.9, Diaphragmatic hernia without obstruction or                         gangrene CPT copyright 2019 [employer]. All  rights reserved. The codes documented in this report are preliminary and upon coder review may  be revised to meet current compliance requirements. Attending Participation:      I personally performed the entire procedure. Volney American, DO [name] DO, DO 09/05/2021 12:11:02 PM This report has been signed electronically. Number of Addenda: 0 Note Initiated On: 09/05/2021 11:38 AM Estimated Blood Loss:  Estimated blood loss was minimal.      [facility]

## 2021-09-05 NOTE — Anesthesia Procedure Notes (Signed)
Date/Time: 09/05/2021 11:53 AM Performed by: Vaughan Sine Pre-anesthesia Checklist: Patient identified, Emergency Drugs available, Suction available, Patient being monitored and Timeout performed Patient Re-evaluated:Patient Re-evaluated prior to induction Oxygen Delivery Method: Nasal cannula Induction Type: IV induction

## 2021-09-05 NOTE — Interval H&P Note (Signed)
History and Physical Interval Note: Preprocedure H&P from 09/05/21  was reviewed and there was no interval change after seeing and examining the patient.  Written consent was obtained from the patient after discussion of risks, benefits, and alternatives. Patient has consented to proceed with Esophagogastroduodenoscopy with possible intervention   09/05/2021 11:36 AM  Chris Mercer  has presented today for surgery, with the diagnosis of GERD HIATAL HERNIA.  The various methods of treatment have been discussed with the patient and family. After consideration of risks, benefits and other options for treatment, the patient has consented to  Procedure(s): ESOPHAGOGASTRODUODENOSCOPY (EGD) WITH PROPOFOL (N/A) as a surgical intervention.  The patient's history has been reviewed, patient examined, no change in status, stable for surgery.  I have reviewed the patient's chart and labs.  Questions were answered to the patient's satisfaction.     [name]

## 2021-09-05 NOTE — Anesthesia Preprocedure Evaluation (Addendum)
Anesthesia Evaluation  Patient identified by MRN, date of birth, ID band Patient awake    Reviewed: Allergy & Precautions, NPO status , Patient's Chart, lab work & pertinent test results  Airway Mallampati: III  TM Distance: >3 FB Neck ROM: full    Dental  (+) Edentulous Upper, Poor Dentition, Missing,    Pulmonary neg pulmonary ROS, former smoker,    Pulmonary exam normal        Cardiovascular Exercise Tolerance: Poor hypertension, + CAD and + Cardiac Stents  Normal cardiovascular exam     Neuro/Psych PSYCHIATRIC DISORDERS (Chronic pain syndrome) Anxiety Depression  Neuromuscular disease (Neuropathy)    GI/Hepatic Neg liver ROS, hiatal hernia, GERD  ,  Endo/Other  negative endocrine ROSdiabetes, Type 2  Renal/GU negative Renal ROS  negative genitourinary   Musculoskeletal   Abdominal (+) + obese,   Peds  Hematology negative hematology ROS (+)   Anesthesia Other Findings Past Medical History: No date: Diabetes mellitus without complication (HCC) No date: HLD (hyperlipidemia) No date: Hypertension No date: Neuropathy  Past Surgical History: No date: AMPUTATION TOE No date: APPENDECTOMY No date: CORONARY ANGIOPLASTY WITH STENT PLACEMENT No date: HERNIA REPAIR     Comment:  x3     Reproductive/Obstetrics negative OB ROS                            Anesthesia Physical Anesthesia Plan  ASA: 3  Anesthesia Plan: General   Post-op Pain Management:    Induction: Intravenous  PONV Risk Score and Plan: Propofol infusion and TIVA  Airway Management Planned: Nasal Cannula and Natural Airway  Additional Equipment:   Intra-op Plan:   Post-operative Plan:   Informed Consent: I have reviewed the patients History and Physical, chart, labs and discussed the procedure including the risks, benefits and alternatives for the proposed anesthesia with the patient or authorized representative  who has indicated his/her understanding and acceptance.     Dental Advisory Given  Plan Discussed with: Anesthesiologist, CRNA and Surgeon  Anesthesia Plan Comments:         Anesthesia Quick Evaluation

## 2021-09-05 NOTE — H&P (Signed)
[facility] Pre-Procedure H&P   Patient ID: Chris Mercer is a 75 y.o. male.  Gastroenterology Provider: [name], DO  Referring Provider: Laurine Blazer, PA PCP: [facility]  Date: 09/05/2021  HPI Mr. Chris Mercer is a 75 y.o. male who presents today for Esophagogastroduodenoscopy for large hiatal hernia with worsening reflux, regurgitation and shortness of breath.  Being evaluated by surgery for hiatal hernia repair. Has had ongoing reflux despite maximal medical therapy. Still describing oropharyngeal dysphagia. No melena hematochezia nausea vomiting hematemesis.  No fhx of gi disease or malignancy.   Past Medical History:  Diagnosis Date   Diabetes mellitus without complication (HCC)    HLD (hyperlipidemia)    Hypertension    Neuropathy     Past Surgical History:  Procedure Laterality Date   AMPUTATION TOE     APPENDECTOMY     CORONARY ANGIOPLASTY WITH STENT PLACEMENT     HERNIA REPAIR     x3    Family History No h/o GI disease or malignancy  Review of Systems  Constitutional:  Negative for activity change, appetite change, chills, fatigue, fever and unexpected weight change.  HENT:  Positive for trouble swallowing (oropharyngeal). Negative for voice change.   Respiratory:  Negative for shortness of breath.   Cardiovascular:  Negative for chest pain and palpitations.  Gastrointestinal:  Positive for nausea. Negative for abdominal distention, abdominal pain, anal bleeding, blood in stool, constipation, diarrhea and vomiting.       Regurgitation, reflux. Oropharyngeal dysphagia 2/2 ill fitting dentures  Musculoskeletal:  Negative for arthralgias and myalgias.  Skin:  Negative for color change and pallor.  Neurological:  Negative for dizziness, syncope and weakness.  Psychiatric/Behavioral:  Negative for confusion. The patient is not nervous/anxious.   All other systems reviewed and are negative.   Medications No  current facility-administered medications on file prior to encounter.   Current Outpatient Medications on File Prior to Encounter  Medication Sig Dispense Refill   ARIPiprazole (ABILIFY) 15 MG tablet      budesonide (PULMICORT) 0.25 MG/2ML nebulizer solution Inhale into the lungs.     busPIRone (BUSPAR) 7.5 MG tablet Take 1 tablet (7.5 mg total) by mouth 2 (two) times daily. 60 tablet 1   Cholecalciferol 1.25 MG (50000 UT) TABS Take by mouth.     diclofenac Sodium (VOLTAREN) 1 % GEL USING DOSING CARD APPLY 1 GRAM OF GEL TOPICALLY TO BILATERAL KNEES EVERY 8 HOURS     doxepin (SINEQUAN) 10 MG capsule Take by mouth.     escitalopram (LEXAPRO) 20 MG tablet Take 20 mg by mouth daily.     Fluticasone-Umeclidin-Vilant (TRELEGY ELLIPTA) 100-62.5-25 MCG/INH AEPB      gabapentin (NEURONTIN) 600 MG tablet Take by mouth.     hydrocortisone 2.5 % cream      hydrOXYzine (ATARAX/VISTARIL) 25 MG tablet Take 25 mg by mouth 3 (three) times daily as needed.     ipratropium-albuterol (DUONEB) 0.5-2.5 (3) MG/3ML SOLN Take 3 mLs by nebulization 2 (two) times daily.     lisinopril (PRINIVIL,ZESTRIL) 40 MG tablet Take 40 mg by mouth daily.     metFORMIN (GLUCOPHAGE) 500 MG tablet Take by mouth.     omeprazole (PRILOSEC) 40 MG capsule Take 40 mg by mouth daily.     oxyCODONE (OXY IR/ROXICODONE) 5 MG immediate release tablet Take 1 tablet (5 mg total) by mouth every 6 (six) hours as needed for severe pain or breakthrough pain. 20 tablet 0   pantoprazole (PROTONIX) 40 MG tablet  Take 40 mg by mouth daily. FOR 14 DAYS     rosuvastatin (CRESTOR) 40 MG tablet Take by mouth.     sucralfate (CARAFATE) 1 g tablet TAKE 1 TABLET THREE TIMES A DAY BEFORE MEALS, DISSOLVE TABLET IN 1 TABLESPOON OF WATER TO MAKE A SLURRY , HOLD VITAMIND     tiotropium (SPIRIVA HANDIHALER) 18 MCG inhalation capsule Place into inhaler and inhale.     ALPRAZolam (XANAX) 0.25 MG tablet Take by mouth.     FLUoxetine (PROZAC) 10 MG capsule Take 1  capsule (10 mg total) by mouth daily. (Patient not taking: Reported on 09/05/2021) 30 capsule 1   naloxone (NARCAN) nasal spray 4 mg/0.1 mL      prazosin (MINIPRESS) 2 MG capsule Take 2 mg by mouth at bedtime. (Patient not taking: Reported on 09/05/2021)      Pertinent medications related to GI and procedure were reviewed by me with the patient prior to the procedure   Current Facility-Administered Medications:    0.9 %  sodium chloride infusion, , Intravenous, Continuous, [name], DO  sodium chloride         Allergies  Allergen Reactions   Azithromycin Anaphylaxis, Swelling and Hives   Fenoprofen Anaphylaxis   Duloxetine Diarrhea    With dark stools      Allergies were reviewed by me prior to the procedure  Objective    Vitals:   09/05/21 1105  BP: (!) 175/84  Pulse: 66  Resp: 16  Temp: (!) 96.2 F (35.7 C)  TempSrc: Temporal  SpO2: 96%  Weight: 105.4 kg  Height: 6' (1.829 m)    Physical Exam Vitals and nursing note reviewed.  Constitutional:      General: He is not in acute distress.    Appearance: Normal appearance. He is not toxic-appearing or diaphoretic.  HENT:     Head: Normocephalic and atraumatic.     Nose: Nose normal.     Mouth/Throat:     Mouth: Mucous membranes are moist.     Pharynx: Oropharynx is clear.  Eyes:     General: No scleral icterus.    Extraocular Movements: Extraocular movements intact.  Cardiovascular:     Rate and Rhythm: Normal rate and regular rhythm.     Heart sounds: Normal heart sounds. No murmur heard.   No friction rub. No gallop.  Pulmonary:     Effort: Pulmonary effort is normal. No respiratory distress.     Breath sounds: Normal breath sounds. No wheezing, rhonchi or rales.  Abdominal:     General: Bowel sounds are normal. There is no distension.     Palpations: Abdomen is soft.     Tenderness: There is no abdominal tenderness. There is no guarding or rebound.     Comments: protuberant  Musculoskeletal:      Cervical back: Neck supple.     Right lower leg: No edema.     Left lower leg: No edema.  Skin:    General: Skin is warm and dry.     Coloration: Skin is not jaundiced or pale.  Neurological:     General: No focal deficit present.     Mental Status: He is alert and oriented to person, place, and time. Mental status is at baseline.  Psychiatric:        Mood and Affect: Mood normal.        Behavior: Behavior normal.        Thought Content: Thought content normal.  Judgment: Judgment normal.     Assessment:  Mr. Vishal Varallo is a 75 y.o. male  who presents today for Esophagogastroduodenoscopy for large hiatal hernia with worsening reflux, regurgitation and shortness of breath.  Plan:  Esophagogastroduodenoscopy with possible intervention today  Esophagogastroduodenoscopy with possible biopsy, control of bleeding, polypectomy, and interventions as necessary has been discussed with the patient/patient representative. Informed consent was obtained from the patient/patient representative after explaining the indication, nature, and risks of the procedure including but not limited to death, bleeding, perforation, missed neoplasm/lesions, cardiorespiratory compromise, and reaction to medications. Opportunity for questions was given and appropriate answers were provided. Patient/patient representative has verbalized understanding is amenable to undergoing the procedure.   [name], DO  [facility]  Portions of the record may have been created with voice recognition software. Occasional wrong-word or 'sound-a-like' substitutions may have occurred due to the inherent limitations of voice recognition software.  Read the chart carefully and recognize, using context, where substitutions may have occurred.

## 2021-09-05 NOTE — Transfer of Care (Signed)
Immediate Anesthesia Transfer of Care Note  Patient: Chris Mercer  Procedure(s) Performed: ESOPHAGOGASTRODUODENOSCOPY (EGD) WITH PROPOFOL  Patient Location: PACU  Anesthesia Type:General  Level of Consciousness: awake and sedated  Airway & Oxygen Therapy: Patient Spontanous Breathing and Patient connected to face mask oxygen  Post-op Assessment: Report given to RN and Post -op Vital signs reviewed and stable  Post vital signs: Reviewed and stable  Last Vitals:  Vitals Value Taken Time  BP    Temp    Pulse    Resp    SpO2      Last Pain:  Vitals:   09/05/21 1105  TempSrc: Temporal  PainSc: 0-No pain         Complications: No notable events documented.

## 2021-09-06 ENCOUNTER — Encounter: Payer: Self-pay | Admitting: Gastroenterology

## 2021-09-06 LAB — SURGICAL PATHOLOGY

## 2021-09-09 ENCOUNTER — Telehealth: Payer: Self-pay | Admitting: Surgery

## 2021-09-09 ENCOUNTER — Encounter: Payer: Self-pay | Admitting: Surgery

## 2021-09-09 ENCOUNTER — Telehealth: Payer: Self-pay

## 2021-09-09 ENCOUNTER — Other Ambulatory Visit: Payer: Self-pay

## 2021-09-09 ENCOUNTER — Ambulatory Visit: Payer: Medicare HMO | Admitting: Surgery

## 2021-09-09 VITALS — BP 159/92 | HR 63 | Temp 98.5°F | Ht 72.0 in | Wt 239.4 lb

## 2021-09-09 DIAGNOSIS — K449 Diaphragmatic hernia without obstruction or gangrene: Secondary | ICD-10-CM

## 2021-09-09 DIAGNOSIS — E11621 Type 2 diabetes mellitus with foot ulcer: Secondary | ICD-10-CM | POA: Diagnosis not present

## 2021-09-09 DIAGNOSIS — L89893 Pressure ulcer of other site, stage 3: Secondary | ICD-10-CM | POA: Diagnosis not present

## 2021-09-09 NOTE — H&P (View-Only) (Signed)
Outpatient Surgical Follow Up  09/09/2021  Chris Mercer is an 75 y.o. male.   Chief Complaint  Patient presents with   Follow-up    Discuss hiatal hernia surgery    HPI: Chris Mercer is a 75 year old male with a history of COPD chronic cough and dyspnea on exertion.  He does have a large paraesophageal hernia.  He underwent recent endoscopic evaluation as well as a barium swallow.  Please note that I have personally reviewed the images from both studies.  There is evidence of a long segment Barrett's esophagus without dysplasia.  There is also evidence of a large paraesophageal hernia type III.  There is no evidence of a strictures.  He is doing okay regarding his COPD.  Last time he saw cardiology was about a month ago he has no active cardiac issues at this time. Lives on his own with his dog but does have his son that lives close by.   Past Medical History:  Diagnosis Date   Diabetes mellitus without complication ([place])    HLD (hyperlipidemia)    Hypertension    Neuropathy     Past Surgical History:  Procedure Laterality Date   AMPUTATION TOE     APPENDECTOMY     CORONARY ANGIOPLASTY WITH STENT PLACEMENT     ESOPHAGOGASTRODUODENOSCOPY (EGD) WITH PROPOFOL N/A 09/05/2021   Procedure: ESOPHAGOGASTRODUODENOSCOPY (EGD) WITH PROPOFOL;  Surgeon: [name], DO;  Location: [facility] ENDOSCOPY;  Service: Endoscopy;  Laterality: N/A;   HERNIA REPAIR     x3    Family History  Problem Relation Age of Onset   Heart disease Father     Social History:  reports that he quit smoking about 32 years ago. His smoking use included cigarettes. He has never used smokeless tobacco. He reports that he does not currently use alcohol. He reports that he does not currently use drugs.  Allergies:  Allergies  Allergen Reactions   Azithromycin Anaphylaxis, Swelling and Hives   Fenoprofen Anaphylaxis   Duloxetine Diarrhea    With dark stools       Medications reviewed.    ROS Full ROS  performed and is otherwise negative other than what is stated in HPI   BP (!) 159/92   Pulse 63   Temp 98.5 F (36.9 C) (Oral)   Ht 6' (1.829 m)   Wt 239 lb 6.4 oz (108.6 kg)   SpO2 92%   BMI 32.47 kg/m   Physical Exam CONSTITUTIONAL: NAD EYES: Pupils are equal, round,  Sclera are non-icteric. EARS, NOSE, MOUTH AND THROAT:  He is wearing am mask Hearing is intact to voice. LYMPH NODES:  Lymph nodes in the neck are normal. RESPIRATORY:  Lungs are decreased on bases There is normal respiratory effort, with equal breath sounds bilaterally, and without pathologic use of accessory muscles. CARDIOVASCULAR: Heart is regular without murmurs, gallops, or rubs. GI: The abdomen is  soft, nontender, and nondistended. There are no palpable masses. There is no hepatosplenomegaly. There are normal bowel sounds .  There is prior evidence of umbilical scar but no evidence of ventral hernias.  He does have diastases recti MUSCULOSKELETAL: Normal muscle strength and tone. No cyanosis or edema.   SKIN: Turgor is good and there are no pathologic skin lesions or ulcers. NEUROLOGIC: Motor and sensation is grossly normal. Cranial nerves are grossly intact. PSYCH:  Oriented to person, place and time. Affect is normal.          Assessment/Plan: 75 year old male with a large paraesophageal hernia  type III and current long segment Barrett's.  He is symptomatic and given prior mentioned findings I do think that I gave the [facility] localization.  To repair the paraesophageal hernia and perform an antireflux procedure.  Clinically he has no dysphagia and there was no strictures on endoscopic or radiological images.  I do think that he is better served with 360 fullness and fundoplication as well as paraesophageal hernia repair.  Procedure discussed with the patient in detail.  Risk benefits and possible implications including but not limited to: Bleeding, infection injury to adjacent structures, esophageal injuries,  recurrence of symptoms.  He understands and wished to proceed   Greater than 50% of the 40 minutes  visit was spent in counseling/coordination of care   Caroleen Hamman, MD [place] Surgeon

## 2021-09-09 NOTE — Telephone Encounter (Signed)
Cardiac Clearance faxed to Dr.Shakat Humphrey Rolls at this time [phone].

## 2021-09-09 NOTE — Progress Notes (Signed)
Outpatient Surgical Follow Up  09/09/2021  Chris Mercer is an 75 y.o. male.   Chief Complaint  Patient presents with   Follow-up    Discuss hiatal hernia surgery    HPI: Chris Mercer is a 75 year old male with a history of COPD chronic cough and dyspnea on exertion.  He does have a large paraesophageal hernia.  He underwent recent endoscopic evaluation as well as a barium swallow.  Please note that I have personally reviewed the images from both studies.  There is evidence of a long segment Barrett's esophagus without dysplasia.  There is also evidence of a large paraesophageal hernia type III.  There is no evidence of a strictures.  He is doing okay regarding his COPD.  Last time he saw cardiology was about a month ago he has no active cardiac issues at this time. Lives on his own with his dog but does have his son that lives close by.   Past Medical History:  Diagnosis Date   Diabetes mellitus without complication ([place])    HLD (hyperlipidemia)    Hypertension    Neuropathy     Past Surgical History:  Procedure Laterality Date   AMPUTATION TOE     APPENDECTOMY     CORONARY ANGIOPLASTY WITH STENT PLACEMENT     ESOPHAGOGASTRODUODENOSCOPY (EGD) WITH PROPOFOL N/A 09/05/2021   Procedure: ESOPHAGOGASTRODUODENOSCOPY (EGD) WITH PROPOFOL;  Surgeon: [name], DO;  Location: [facility] ENDOSCOPY;  Service: Endoscopy;  Laterality: N/A;   HERNIA REPAIR     x3    Family History  Problem Relation Age of Onset   Heart disease Father     Social History:  reports that he quit smoking about 32 years ago. His smoking use included cigarettes. He has never used smokeless tobacco. He reports that he does not currently use alcohol. He reports that he does not currently use drugs.  Allergies:  Allergies  Allergen Reactions   Azithromycin Anaphylaxis, Swelling and Hives   Fenoprofen Anaphylaxis   Duloxetine Diarrhea    With dark stools       Medications reviewed.    ROS Full ROS  performed and is otherwise negative other than what is stated in HPI   BP (!) 159/92   Pulse 63   Temp 98.5 F (36.9 C) (Oral)   Ht 6' (1.829 m)   Wt 239 lb 6.4 oz (108.6 kg)   SpO2 92%   BMI 32.47 kg/m   Physical Exam CONSTITUTIONAL: NAD EYES: Pupils are equal, round,  Sclera are non-icteric. EARS, NOSE, MOUTH AND THROAT:  He is wearing am mask Hearing is intact to voice. LYMPH NODES:  Lymph nodes in the neck are normal. RESPIRATORY:  Lungs are decreased on bases There is normal respiratory effort, with equal breath sounds bilaterally, and without pathologic use of accessory muscles. CARDIOVASCULAR: Heart is regular without murmurs, gallops, or rubs. GI: The abdomen is  soft, nontender, and nondistended. There are no palpable masses. There is no hepatosplenomegaly. There are normal bowel sounds .  There is prior evidence of umbilical scar but no evidence of ventral hernias.  He does have diastases recti MUSCULOSKELETAL: Normal muscle strength and tone. No cyanosis or edema.   SKIN: Turgor is good and there are no pathologic skin lesions or ulcers. NEUROLOGIC: Motor and sensation is grossly normal. Cranial nerves are grossly intact. PSYCH:  Oriented to person, place and time. Affect is normal.          Assessment/Plan: 75 year old male with a large paraesophageal hernia  type III and current long segment Barrett's.  He is symptomatic and given prior mentioned findings I do think that I gave the [facility] localization.  To repair the paraesophageal hernia and perform an antireflux procedure.  Clinically he has no dysphagia and there was no strictures on endoscopic or radiological images.  I do think that he is better served with 360 fullness and fundoplication as well as paraesophageal hernia repair.  Procedure discussed with the patient in detail.  Risk benefits and possible implications including but not limited to: Bleeding, infection injury to adjacent structures, esophageal injuries,  recurrence of symptoms.  He understands and wished to proceed   Greater than 50% of the 40 minutes  visit was spent in counseling/coordination of care   Caroleen Hamman, MD [place] Surgeon

## 2021-09-09 NOTE — Patient Instructions (Addendum)
Our surgery scheduler will call you within 24-48 hours to schedule your surgery. Please have the [facility] surgery sheet available when speaking with her.  We will send Cardiac Clearance to Dr.Shakat Humphrey Rolls today. Please call his office to see if you need to be seen prior to having your surgery.   Hiatal Hernia A hiatal hernia occurs when part of the stomach slides above the muscle that separates the abdomen from the chest (diaphragm). A person can be born with a hiatal hernia (congenital), or it may develop over time. In almost all cases of hiatal hernia, only the top part of the stomach pushes through the diaphragm. Many people have a hiatal hernia with no symptoms. The larger the hernia, the more likely it is that you will have symptoms. In some cases, a hiatal hernia allows stomach acid to flow back into the tube that carries food from your mouth to your stomach (esophagus). This may cause heartburn symptoms. Severe heartburn symptoms may mean that you have developed a condition called gastroesophageal reflux disease (GERD). What are the causes? This condition is caused by a weakness in the opening (hiatus) where the esophagus passes through the diaphragm to attach to the upper part of the stomach. A person may be born with a weakness in the hiatus, or a weakness can develop over time. What increases the risk? This condition is more likely to develop in: Older people. Age is a major risk factor for a hiatal hernia, especially if you are over the age of 25. Pregnant women. People who are overweight. People who have frequent constipation. What are the signs or symptoms? Symptoms of this condition usually develop in the form of GERD symptoms. Symptoms include: Heartburn. Belching. Indigestion. Trouble swallowing. Coughing or wheezing. Sore throat. Hoarseness. Chest pain. Nausea and vomiting. How is this diagnosed? This condition may be diagnosed during testing for GERD. Tests that may be done  include: X-rays of your stomach or chest. An upper gastrointestinal (GI) series. This is an X-ray exam of your GI tract that is taken after you swallow a chalky liquid that shows up clearly on the X-ray. Endoscopy. This is a procedure to look into your stomach using a thin, flexible tube that has a tiny camera and light on the end of it. How is this treated? This condition may be treated by: Dietary and lifestyle changes to help reduce GERD symptoms. Medicines. These may include: Over-the-counter antacids. Medicines that make your stomach empty more quickly. Medicines that block the production of stomach acid (H2 blockers). Stronger medicines to reduce stomach acid (proton pump inhibitors). Surgery to repair the hernia, if other treatments are not helping. If you have no symptoms, you may not need treatment. Follow these instructions at home: Lifestyle and activity Do not use any products that contain nicotine or tobacco, such as cigarettes and e-cigarettes. If you need help quitting, ask your health care provider. Try to achieve and maintain a healthy body weight. Avoid putting pressure on your abdomen. Anything that puts pressure on your abdomen increases the amount of acid that may be pushed up into your esophagus. Avoid bending over, especially after eating. Raise the head of your bed by putting blocks under the legs. This keeps your head and esophagus higher than your stomach. Do not wear tight clothing around your chest or stomach. Try not to strain when having a bowel movement, when urinating, or when lifting heavy objects. Eating and drinking Avoid foods that can worsen GERD symptoms. These may include: Fatty  foods, like fried foods. Citrus fruits, like oranges or lemon. Other foods and drinks that contain acid, like orange juice or tomatoes. Spicy food. Chocolate. Eat frequent small meals instead of three large meals a day. This helps prevent your stomach from getting too  full. Eat slowly. Do not lie down right after eating. Do not eat 1-2 hours before bed. Do not drink beverages with caffeine. These include cola, coffee, cocoa, and tea. Do not drink alcohol. General instructions Take over-the-counter and prescription medicines only as told by your health care provider. Keep all follow-up visits as told by your health care provider. This is important. Contact a health care provider if: Your symptoms are not controlled with medicines or lifestyle changes. You are having trouble swallowing. You have coughing or wheezing that will not go away. Get help right away if: Your pain is getting worse. Your pain spreads to your arms, neck, jaw, teeth, or back. You have shortness of breath. You sweat for no reason. You feel sick to your stomach (nauseous) or you vomit. You vomit blood. You have bright red blood in your stools. You have black, tarry stools. Summary A hiatal hernia occurs when part of the stomach slides above the muscle that separates the abdomen from the chest (diaphragm). A person may be born with a weakness in the hiatus, or a weakness can develop over time. Symptoms of hiatal hernia may include heartburn, trouble swallowing, or sore throat. Management of hiatal hernia includes eating frequent small meals instead of three large meals a day. Get help right away if you vomit blood, have bright red blood in your stools, or have black, tarry stools. This information is not intended to replace advice given to you by your health care provider. Make sure you discuss any questions you have with your health care provider. Document Revised: 11/15/2020 Document Reviewed: 11/15/2020 Elsevier Patient Education  2022 [employer].

## 2021-09-09 NOTE — Telephone Encounter (Signed)
Patient has been advised of Pre-Admission date/time, COVID Testing date and Surgery date.  Surgery Date: 09/26/21 Preadmission Testing Date: 09/18/21 (phone 8a-1p-) Covid Testing Date: 09/24/21 @ 8:20 am - patient advised to go to the [facility] between 8a-12:00 pm  Patient has been made aware to call [phone], between 1-3:00pm the day before surgery, to find out what time to arrive for surgery.

## 2021-09-12 ENCOUNTER — Telehealth: Payer: Self-pay

## 2021-09-12 DIAGNOSIS — R55 Syncope and collapse: Secondary | ICD-10-CM | POA: Diagnosis not present

## 2021-09-12 DIAGNOSIS — K219 Gastro-esophageal reflux disease without esophagitis: Secondary | ICD-10-CM | POA: Diagnosis not present

## 2021-09-12 DIAGNOSIS — I2510 Atherosclerotic heart disease of native coronary artery without angina pectoris: Secondary | ICD-10-CM | POA: Diagnosis not present

## 2021-09-12 DIAGNOSIS — R0602 Shortness of breath: Secondary | ICD-10-CM | POA: Diagnosis not present

## 2021-09-12 DIAGNOSIS — I10 Essential (primary) hypertension: Secondary | ICD-10-CM | POA: Diagnosis not present

## 2021-09-12 NOTE — Progress Notes (Unsigned)
Cardiac Clearance has been received from Dr Laurelyn Sickle office. The patient is cleared at Low risk for surgery.

## 2021-09-12 NOTE — Telephone Encounter (Signed)
Received medical clearance from Dr. Neoma Laming. Pt's procedure risk assessment is medium and is optimized for surgery.

## 2021-09-15 DIAGNOSIS — J449 Chronic obstructive pulmonary disease, unspecified: Secondary | ICD-10-CM | POA: Diagnosis not present

## 2021-09-16 DIAGNOSIS — E669 Obesity, unspecified: Secondary | ICD-10-CM | POA: Diagnosis not present

## 2021-09-16 DIAGNOSIS — G609 Hereditary and idiopathic neuropathy, unspecified: Secondary | ICD-10-CM | POA: Diagnosis not present

## 2021-09-16 DIAGNOSIS — F32A Depression, unspecified: Secondary | ICD-10-CM | POA: Diagnosis not present

## 2021-09-16 DIAGNOSIS — Z79899 Other long term (current) drug therapy: Secondary | ICD-10-CM | POA: Diagnosis not present

## 2021-09-16 DIAGNOSIS — M1711 Unilateral primary osteoarthritis, right knee: Secondary | ICD-10-CM | POA: Diagnosis not present

## 2021-09-18 ENCOUNTER — Other Ambulatory Visit: Payer: Self-pay

## 2021-09-18 ENCOUNTER — Other Ambulatory Visit
Admission: RE | Admit: 2021-09-18 | Discharge: 2021-09-18 | Disposition: A | Discharge status: Home / Self Care | Payer: Medicare HMO | Source: Ambulatory Visit | Attending: Surgery | Admitting: Surgery

## 2021-09-18 NOTE — Patient Instructions (Addendum)
Your procedure is scheduled on: 09/26/2021  Report to the Registration Desk on the 1st floor of the [facility]. To find out your arrival time, please call [phone] between 1PM - 3PM on: 09/25/2021   REMEMBER: Instructions that are not followed completely may result in serious medical risk, up to and including death; or upon the discretion of your surgeon and anesthesiologist your surgery may need to be rescheduled.  Do not eat food after midnight the night before surgery.  No gum chewing, lozengers or hard candies.  You may however, drink water up to 2 hours before you are scheduled to arrive for your surgery. Do not drink anything within 2 hours of your scheduled arrival time.  Type 1 and Type 2 diabetics should only drink water.   TAKE THESE MEDICATIONS THE MORNING OF SURGERY WITH A SIP OF WATER: busPIRone Lexapro Gabapentin hydrOXYzine  Crestor Protonix ( one on the morning of surgery - helps to prevent nausea after surgery.) Take your Prilosec night before surgery. Doxepin Oxycodone as needed xanax  Use nebulizer on the day of surgery .   **Follow new guidelines for insulin and diabetes medications.**  Hold metformin 2 days prior to surgery. Last dose will be 09/23/2021  Do not take Glipizide on day of surgery.     One week prior to surgery: Stop Anti-inflammatories (NSAIDS) such as Advil, Aleve, Ibuprofen, Motrin, Naproxen, Naprosyn and Aspirin based products such as Excedrin, Goodys Powder, BC Powder. Stop ANY OVER THE COUNTER supplements until after surgery. You may however, continue to take Tylenol if needed for pain up until the day of surgery.  No Alcohol for 24 hours before or after surgery.  No Smoking including e-cigarettes for 24 hours prior to surgery.  No chewable tobacco products for at least 6 hours prior to surgery.  No nicotine patches on the day of surgery.  Do not use any "recreational" drugs for at least a week prior to your surgery.  Please  be advised that the combination of cocaine and anesthesia may have negative outcomes, up to and including death. If you test positive for cocaine, your surgery will be cancelled.  On the morning of surgery brush your teeth with toothpaste and water, you may rinse your mouth with mouthwash if you wish. Do not swallow any toothpaste or mouthwash.  Use CHG Soap or wipes as directed on instruction sheet.- provided for you   Do not wear jewelry.  Do not wear lotions, powders, or perfumes.   Do not shave body from the neck down 48 hours prior to surgery just in case you cut yourself which could leave a site for infection.  Also, freshly shaved skin may become irritated if using the CHG soap.  Contact lenses, hearing aids and dentures may not be worn into surgery.  Do not bring valuables to the hospital. [facility] is not responsible for any missing/lost belongings or valuables.    Notify your doctor if there is any change in your medical condition (cold, fever, infection).  Wear comfortable clothing (specific to your surgery type) to the hospital.  After surgery, you can help prevent lung complications by doing breathing exercises.  Take deep breaths and cough every 1-2 hours. Your doctor may order a device called an Incentive Spirometer to help you take deep breaths.  If you are being admitted to the hospital overnight, leave your suitcase in the car. After surgery it may be brought to your room.  If you are being discharged the day  of surgery, you will not be allowed to drive home. You will need a responsible adult (18 years or older) to drive you home and stay with you that night.   If you are taking public transportation, you will need to have a responsible adult (18 years or older) with you. Please confirm with your physician that it is acceptable to use public transportation.   Please call the [facility] at [phone] if you have any questions about these  instructions.  Surgery Visitation Policy:  Patients undergoing a surgery or procedure may have one family member or support person with them as long as that person is not COVID-19 positive or experiencing its symptoms.  That person may remain in the waiting area during the procedure and may rotate out with other people.  Inpatient Visitation:    Visiting hours are 7 a.m. to 8 p.m. Up to two visitors ages 16+ are allowed at one time in a patient room. The visitors may rotate out with other people during the day. Visitors must check out when they leave, or other visitors will not be allowed. One designated support person may remain overnight. The visitor must pass COVID-19 screenings, use hand sanitizer when entering and exiting the patient's room and wear a mask at all times, including in the patient's room. Patients must also wear a mask when staff or their visitor are in the room. Masking is required regardless of vaccination status.

## 2021-09-19 ENCOUNTER — Encounter
Admission: RE | Admit: 2021-09-19 | Discharge: 2021-09-19 | Disposition: A | Discharge status: Home / Self Care | Payer: Medicare HMO | Source: Ambulatory Visit | Attending: Surgery | Admitting: Surgery

## 2021-09-19 ENCOUNTER — Encounter: Payer: Self-pay | Admitting: Surgery

## 2021-09-19 DIAGNOSIS — Z01818 Encounter for other preprocedural examination: Secondary | ICD-10-CM | POA: Insufficient documentation

## 2021-09-19 DIAGNOSIS — I444 Left anterior fascicular block: Secondary | ICD-10-CM | POA: Insufficient documentation

## 2021-09-19 DIAGNOSIS — Z01810 Encounter for preprocedural cardiovascular examination: Secondary | ICD-10-CM | POA: Diagnosis not present

## 2021-09-19 LAB — CBC WITH DIFFERENTIAL/PLATELET
Abs Immature Granulocytes: 0.04 10*3/uL (ref 0.00–0.07)
Basophils Absolute: 0.1 10*3/uL (ref 0.0–0.1)
Basophils Relative: 1 %
Eosinophils Absolute: 0.2 10*3/uL (ref 0.0–0.5)
Eosinophils Relative: 3 %
HCT: 36 % — ABNORMAL LOW (ref 39.0–52.0)
Hemoglobin: 12.1 g/dL — ABNORMAL LOW (ref 13.0–17.0)
Immature Granulocytes: 1 %
Lymphocytes Relative: 14 %
Lymphs Abs: 1.3 10*3/uL (ref 0.7–4.0)
MCH: 29.4 pg (ref 26.0–34.0)
MCHC: 33.6 g/dL (ref 30.0–36.0)
MCV: 87.6 fL (ref 80.0–100.0)
Monocytes Absolute: 0.5 10*3/uL (ref 0.1–1.0)
Monocytes Relative: 6 %
Neutro Abs: 6.7 10*3/uL (ref 1.7–7.7)
Neutrophils Relative %: 75 %
Platelets: 187 10*3/uL (ref 150–400)
RBC: 4.11 MIL/uL — ABNORMAL LOW (ref 4.22–5.81)
RDW: 13.8 % (ref 11.5–15.5)
WBC: 8.8 10*3/uL (ref 4.0–10.5)
nRBC: 0 % (ref 0.0–0.2)

## 2021-09-19 LAB — COMPREHENSIVE METABOLIC PANEL
ALT: 28 U/L (ref 0–44)
AST: 23 U/L (ref 15–41)
Albumin: 3.6 g/dL (ref 3.5–5.0)
Alkaline Phosphatase: 92 U/L (ref 38–126)
Anion gap: 9 (ref 5–15)
BUN: 21 mg/dL (ref 8–23)
CO2: 24 mmol/L (ref 22–32)
Calcium: 9.6 mg/dL (ref 8.9–10.3)
Chloride: 105 mmol/L (ref 98–111)
Creatinine, Ser: 1.12 mg/dL (ref 0.61–1.24)
GFR, Estimated: >60 mL/min (ref >60)
Glucose, Bld: 185 mg/dL — ABNORMAL HIGH (ref 70–99)
Potassium: 3.7 mmol/L (ref 3.5–5.1)
Sodium: 138 mmol/L (ref 135–145)
Total Bilirubin: 0.3 mg/dL (ref 0.3–1.2)
Total Protein: 6.7 g/dL (ref 6.5–8.1)

## 2021-09-19 LAB — PROTIME-INR
INR: 0.9 (ref 0.8–1.2)
Prothrombin Time: 12.5 s (ref 11.4–15.2)

## 2021-09-23 ENCOUNTER — Encounter: Payer: Self-pay | Admitting: Surgery

## 2021-09-23 NOTE — Progress Notes (Signed)
Perioperative Services  Pre-Admission/Anesthesia Testing Clinical Review  Date: 09/23/21  Patient Demographics:  Name: Chris Mercer DOB:   September 03, 1946 MRN:   [id number]  Planned Surgical Procedure(s):    Case: [id number] Date/Time: 09/26/21 0715   Procedure: XI ROBOTIC ASSISTED PARAESOPHAGEAL HERNIA REPAIR, RNFA to assist   Anesthesia type: General   Pre-op diagnosis: paraesophageal hernia   Location: [facility] OR ROOM 06 / [facility]   Surgeons: Jules Husbands, MD     NOTE: Available PAT nursing documentation and vital signs have been reviewed. Clinical nursing staff has updated patient's PMH/PSHx, current medication list, and drug allergies/intolerances to ensure comprehensive history available to assist in medical decision making as it pertains to the aforementioned surgical procedure and anticipated anesthetic course. Extensive review of available clinical information performed. [place] PMH and PSHx updated with any diagnoses/procedures that  may have been inadvertently omitted during his intake with the pre-admission testing department's nursing staff.  Clinical Discussion:  Chris Mercer is a 75 y.o. male who is submitted for pre-surgical anesthesia review and clearance prior to him undergoing the above procedure. Patient is a Former Smoker (quit 12/1988). Pertinent PMH includes: CAD, aortic atherosclerosis, LAFB, LVH, CKD-III, COPD, OSAH (does not use nocturnal PAP therapy), GERD (on daily PPI), hiatal hernia, OA, chronic pain syndrome, diabetic neuropathy, depression, anxiety (on BZO).   Patient is followed by cardiology Humphrey Rolls, MD). He was last seen in the cardiology clinic on 09/12/2021; notes reviewed this morning General wed. At the time of his clinic visit, the patient denied any chest pain, shortness of breath, PND, orthopnea, palpitations, significant peripheral edema, vertiginous symptoms, or presyncope/syncope.  Patient with a PMH significant for  cardiovascular diagnoses.  Patient underwent PCI with stent placement (location and type unknown) in 2002. Records unavailable for review at time of consult. Information obtained from current primary attending cardiologist.   Chris Mercer performed on 04/16/2021 revealed a hyperdynamic LVEF of 71%.  There were large severe reversible apex, apical lateral, mid inferolateral, and basal inferolateral perfusion defects noted.  Wall motion was normal.  Concern for ischemia in the LCx territory.  Coronary CTA was recommended.  Coronary CTA performed on 04/25/2021 revealed calcium score of 684.7.  Blood pressure documented at 142/66 on currently prescribed ACEI monotherapy.  Patient is on a statin for his HLD.  He is not diabetic. Functional capacity, as defined by DASI, is documented as being >/= 4 METS.  No changes were made to his medication regimen.  Patient to follow-up with outpatient cardiology in 4-6 months or sooner if needed.  Chris Mercer is scheduled for an elective ROBOT ASSISTED PARAESOPHAGEAL HERNIA REPAIR on 04/26/2021 with Dr. Caroleen Hamman, MD.  Given patient's past medical history significant for cardiovascular diagnoses, presurgical cardiac clearance was sought by the performing surgeon's office and PAT team. Per cardiology, "this patient is optimized for surgery and may proceed with the planned procedural course with a MODERATE risk of significant perioperative cardiovascular complications".  This patient is not currently taking any type of anticoagulation/antiplatelet therapies that would need to be held perioperatively.  Patient denies previous perioperative complications with anesthesia in the past. In review of the available records, it is noted that patient underwent a general anesthetic course here (ASA III) in 08/2021 without documented complications.   Vitals with BMI 09/18/2021 09/09/2021 09/05/2021  Height 6\' 0"  6\' 0"  -  Weight 236 lbs 239 lbs 6 oz -  BMI 32 91.47 -  Systolic - 829  562  Diastolic - 92  79  Pulse - 63 50    Providers/Specialists:   NOTE: Primary physician provider listed below. Patient may have been seen by APP or partner within same practice.   PROVIDER ROLE / SPECIALTY LAST Suszanne Finch, MD GENERAL SURGERY 09/09/2021  [facility] PRIMARY CARE PROVIDER ???  Neoma Laming, MD CARDIOLOGY 09/12/2021   Allergies:  Azithromycin, Fenoprofen, and Duloxetine  Current Home Medications:   No current facility-administered medications for this encounter.    ALPRAZolam (XANAX) 0.25 MG tablet   budesonide (PULMICORT) 0.25 MG/2ML nebulizer solution   busPIRone (BUSPAR) 30 MG tablet   busPIRone (BUSPAR) 7.5 MG tablet   Cholecalciferol 25 MCG (1000 UT) tablet   diclofenac Sodium (VOLTAREN) 1 % GEL   doxepin (SINEQUAN) 150 MG capsule   escitalopram (LEXAPRO) 20 MG tablet   gabapentin (NEURONTIN) 600 MG tablet   glipiZIDE (GLUCOTROL XL) 5 MG 24 hr tablet   hydrOXYzine (ATARAX/VISTARIL) 25 MG tablet   ipratropium-albuterol (DUONEB) 0.5-2.5 (3) MG/3ML SOLN   lisinopril (PRINIVIL,ZESTRIL) 40 MG tablet   metFORMIN (GLUCOPHAGE) 500 MG tablet   naloxone (NARCAN) nasal spray 4 mg/0.1 mL   omeprazole (PRILOSEC) 40 MG capsule   oxyCODONE (OXY IR/ROXICODONE) 5 MG immediate release tablet   pantoprazole (PROTONIX) 40 MG tablet   rosuvastatin (CRESTOR) 40 MG tablet   sucralfate (CARAFATE) 1 g tablet   History:   Past Medical History:  Diagnosis Date   Anxiety    Aortic atherosclerosis (HCC)    CAD (coronary artery disease)    a.) MPI 04/16/2021 --> LVEF 71%. Large severe perferion defect in the apex, apicolateral, mid inferolateral, and basal inferolateral location; no RWMAs; ischemia in LCx territory. b.) CCTA 04/25/2021 --> CCS/Agatston score 684.7.   Chronic pain syndrome    CKD (chronic kidney disease), stage III (HCC)    COPD (chronic obstructive pulmonary disease) (HCC)    Depression    Diabetic neuropathy (HCC)    GERD  (gastroesophageal reflux disease)    Hepatic steatosis    Hiatal hernia    History of heart artery stent 2002   a.) type and location unknown   HLD (hyperlipidemia)    Hypertension    LAFB (left anterior fascicular block)    LVH (left ventricular hypertrophy)    OSA (obstructive sleep apnea)    a.) PSG 12/14/2019 --> AHI 6. b.) cannot tolerate nocturnal PAP therapy d/t COPD and significant GERD.   Osteoarthritis    Right middle lobe pulmonary nodule 03/26/2019   a.) CT 03/25/2021 - measured 2.5 mm.   T2DM (type 2 diabetes mellitus) ([place])    Umbilical hernia    s/p repair   Past Surgical History:  Procedure Laterality Date   AMPUTATION TOE     APPENDECTOMY     CORONARY ANGIOPLASTY WITH STENT PLACEMENT     ESOPHAGOGASTRODUODENOSCOPY (EGD) WITH PROPOFOL N/A 09/05/2021   Procedure: ESOPHAGOGASTRODUODENOSCOPY (EGD) WITH PROPOFOL;  Surgeon: [name], DO;  Location: [facility];  Service: Endoscopy;  Laterality: N/A;   HERNIA REPAIR     x3   Family History  Problem Relation Age of Onset   Heart disease Father    Social History   Tobacco Use   Smoking status: Former    Types: Cigarettes    Quit date: 1990    Years since quitting: 32.7   Smokeless tobacco: Never  Vaping Use   Vaping Use: Never used  Substance Use Topics   Alcohol use: Not Currently   Drug use: Not Currently  Pertinent Clinical Results:  LABS: Labs reviewed: Acceptable for surgery.  No visits with results within 3 Day(s) from this visit.  Latest known visit with results is:  Hospital Outpatient Visit on 09/19/2021  Component Date Value Ref Range Status   WBC 09/19/2021 8.8  4.0 - 10.5 K/uL Final   RBC 09/19/2021 4.11 (A) 4.22 - 5.81 MIL/uL Final   Hemoglobin 09/19/2021 12.1 (A) 13.0 - 17.0 g/dL Final   HCT 09/19/2021 36.0 (A) 39.0 - 52.0 % Final   MCV 09/19/2021 87.6  80.0 - 100.0 fL Final   MCH 09/19/2021 29.4  26.0 - 34.0 pg Final   MCHC 09/19/2021 33.6  30.0 - 36.0 g/dL Final    RDW 09/19/2021 13.8  11.5 - 15.5 % Final   Platelets 09/19/2021 187  150 - 400 K/uL Final   nRBC 09/19/2021 0.0  0.0 - 0.2 % Final   Neutrophils Relative % 09/19/2021 75  % Final   Neutro Abs 09/19/2021 6.7  1.7 - 7.7 K/uL Final   Lymphocytes Relative 09/19/2021 14  % Final   Lymphs Abs 09/19/2021 1.3  0.7 - 4.0 K/uL Final   Monocytes Relative 09/19/2021 6  % Final   Monocytes Absolute 09/19/2021 0.5  0.1 - 1.0 K/uL Final   Eosinophils Relative 09/19/2021 3  % Final   Eosinophils Absolute 09/19/2021 0.2  0.0 - 0.5 K/uL Final   Basophils Relative 09/19/2021 1  % Final   Basophils Absolute 09/19/2021 0.1  0.0 - 0.1 K/uL Final   Immature Granulocytes 09/19/2021 1  % Final   Abs Immature Granulocytes 09/19/2021 0.04  0.00 - 0.07 K/uL Final   Performed at [facility], [address] [place], [place] [address]   Sodium 09/19/2021 138  135 - 145 mmol/L Final   Potassium 09/19/2021 3.7  3.5 - 5.1 mmol/L Final   Chloride 09/19/2021 105  98 - 111 mmol/L Final   CO2 09/19/2021 24  22 - 32 mmol/L Final   Glucose, Bld 09/19/2021 185 (A) 70 - 99 mg/dL Final   Glucose reference range applies only to samples taken after fasting for at least 8 hours.   BUN 09/19/2021 21  8 - 23 mg/dL Final   Creatinine, Ser 09/19/2021 1.12  0.61 - 1.24 mg/dL Final   Calcium 09/19/2021 9.6  8.9 - 10.3 mg/dL Final   Total Protein 09/19/2021 6.7  6.5 - 8.1 g/dL Final   Albumin 09/19/2021 3.6  3.5 - 5.0 g/dL Final   AST 09/19/2021 23  15 - 41 U/L Final   ALT 09/19/2021 28  0 - 44 U/L Final   Alkaline Phosphatase 09/19/2021 92  38 - 126 U/L Final   Total Bilirubin 09/19/2021 0.3  0.3 - 1.2 mg/dL Final   GFR, Estimated 09/19/2021 >60  >60 mL/min Final   Comment: (NOTE) Calculated using the CKD-EPI Creatinine Equation (2021)    Anion gap 09/19/2021 9  5 - 15 Final   Performed at [facility], [address] [place], [place] [address]   Prothrombin Time 09/19/2021 12.5  11.4 - 15.2 seconds Final    INR 09/19/2021 0.9  0.8 - 1.2 Final   Comment: (NOTE) INR goal varies based on device and disease states. Performed at [facility], [address] [place], [place] [address]     ECG: Date: 09/19/2021 Time ECG obtained: 1222 PM Rate: 69 bpm Rhythm: normal sinus; LAFB Axis (leads I and aVF): Normal Intervals: PR 188 ms. QRS 104 ms. QTc 426 ms.  ST segment and T wave changes: No evidence of acute ST segment elevation or depression Comparison: Similar to previous tracing obtained on 04/03/2021   IMAGING / PROCEDURES: DIAGNOSTIC RADIOGRAPHS ESOPHAGUS WITH SINGLE CONTRAST MEDIUM performed on 08/08/2021 Large hiatal hernia with greater than one half of the stomach positioned in the chest Recurrent esophageal dysmotility likely presbyesophagus  CT ABDOMEN PELVIS WITH CONTRAST performed on 08/08/2021 Large hiatal hernia No acute process in the abdomen or pelvis Probable mild hepatic steatosis Aortic atherosclerosis  CORONARY CTA performed on 04/25/2021 Performed for calcium score only CCS/Agatston score 684.7  LEXISCAN performed on 04/16/2021 Hyperdynamic LVEF of 71% Large severe reversible apex, apical lateral, mid inferolateral, and basal inferolateral wall defects Normal wall motion Ischemia in the LCx territory Peak heart rate 110 bpm (75% MPHR) Achieved METS 1.8 Duke treadmill score 3 Moderate risk study Recommendations: CCTA  Impression and Plan:  Chris Mercer has been referred for pre-anesthesia review and clearance prior to him undergoing the planned anesthetic and procedural courses. Available labs, pertinent testing, and imaging results were personally reviewed by me. This patient has been appropriately cleared by cardiology with an overall MODERATE risk of significant perioperative cardiovascular complications.  Based on clinical review performed today (09/23/21), barring any significant acute changes in the patient's overall condition, it is anticipated  that he will be able to proceed with the planned surgical intervention. Any acute changes in clinical condition may necessitate his procedure being postponed and/or cancelled. Patient will meet with anesthesia team (MD and/or CRNA) on the day of his procedure for preoperative evaluation/assessment. Questions regarding anesthetic course will be fielded at that time.   Pre-surgical instructions were reviewed with the patient during his PAT appointment and questions were fielded by PAT clinical staff. Patient was advised that if any questions or concerns arise prior to his procedure then he should return a call to PAT and/or his surgeon's office to discuss.  Honor Loh, MSN, APRN, FNP-C, CEN [facility]  Peri-operative Services Nurse Practitioner Phone: [phone] Fax: [phone] 09/23/21 10:32 AM  NOTE: This note has been prepared using Dragon dictation software. Despite my best ability to proofread, there is always the potential that unintentional transcriptional errors may still occur from this process.

## 2021-09-24 ENCOUNTER — Other Ambulatory Visit: Payer: Self-pay

## 2021-09-24 ENCOUNTER — Other Ambulatory Visit
Admission: RE | Admit: 2021-09-24 | Discharge: 2021-09-24 | Disposition: A | Discharge status: Home / Self Care | Payer: Medicare HMO | Source: Ambulatory Visit | Attending: Surgery | Admitting: Surgery

## 2021-09-24 DIAGNOSIS — Z20822 Contact with and (suspected) exposure to covid-19: Secondary | ICD-10-CM | POA: Insufficient documentation

## 2021-09-24 DIAGNOSIS — Z01812 Encounter for preprocedural laboratory examination: Secondary | ICD-10-CM | POA: Insufficient documentation

## 2021-09-25 LAB — SARS CORONAVIRUS 2 (TAT 6-24 HRS): SARS Coronavirus 2: NEGATIVE

## 2021-09-25 MED ORDER — CHLORHEXIDINE GLUCONATE CLOTH 2 % EX PADS
6.0000 | MEDICATED_PAD | Freq: Once | CUTANEOUS | Status: AC
  Administered 2021-09-26: 6 via TOPICAL

## 2021-09-25 MED ORDER — LACTATED RINGERS IV SOLN: INTRAVENOUS | Status: DC

## 2021-09-25 MED ORDER — CHLORHEXIDINE GLUCONATE 0.12 % MT SOLN: 15.0000 mL | Freq: Once | OROMUCOSAL | Status: AC

## 2021-09-25 MED ORDER — GABAPENTIN 300 MG PO CAPS: 300.0000 mg | ORAL_CAPSULE | ORAL | Status: DC

## 2021-09-25 MED ORDER — ACETAMINOPHEN 500 MG PO TABS: 1000.0000 mg | ORAL_TABLET | ORAL | Status: AC

## 2021-09-25 MED ORDER — CEFAZOLIN SODIUM-DEXTROSE 2-4 GM/100ML-% IV SOLN
2.0000 g | INTRAVENOUS | Status: AC
  Administered 2021-09-26 (×2): 2 g via INTRAVENOUS

## 2021-09-25 MED ORDER — HEPARIN SODIUM (PORCINE) 5000 UNIT/ML IJ SOLN: 5000.0000 [IU] | Freq: Once | INTRAMUSCULAR | Status: AC

## 2021-09-25 MED ORDER — ORAL CARE MOUTH RINSE: 15.0000 mL | Freq: Once | OROMUCOSAL | Status: AC

## 2021-09-25 MED ORDER — CHLORHEXIDINE GLUCONATE CLOTH 2 % EX PADS
6.0000 | MEDICATED_PAD | Freq: Once | CUTANEOUS | Status: AC
  Administered 2021-09-25: 6 via TOPICAL

## 2021-09-26 ENCOUNTER — Ambulatory Visit: Payer: Medicare HMO | Admitting: Urgent Care

## 2021-09-26 ENCOUNTER — Observation Stay: Payer: Medicare HMO

## 2021-09-26 ENCOUNTER — Other Ambulatory Visit: Payer: Self-pay

## 2021-09-26 ENCOUNTER — Observation Stay
Admission: RE | Admit: 2021-09-26 | Discharge: 2021-09-28 | Disposition: A | Discharge status: Home / Self Care | Payer: Medicare HMO | Attending: Surgery | Admitting: Surgery

## 2021-09-26 ENCOUNTER — Encounter
Admission: RE | Disposition: A | Discharge status: Home / Self Care | Payer: Self-pay | Source: Home / Self Care | Attending: Surgery

## 2021-09-26 ENCOUNTER — Encounter: Payer: Self-pay | Admitting: Surgery

## 2021-09-26 DIAGNOSIS — E119 Type 2 diabetes mellitus without complications: Secondary | ICD-10-CM | POA: Diagnosis not present

## 2021-09-26 DIAGNOSIS — I10 Essential (primary) hypertension: Secondary | ICD-10-CM | POA: Diagnosis not present

## 2021-09-26 DIAGNOSIS — K449 Diaphragmatic hernia without obstruction or gangrene: Secondary | ICD-10-CM | POA: Diagnosis not present

## 2021-09-26 DIAGNOSIS — Z8719 Personal history of other diseases of the digestive system: Secondary | ICD-10-CM

## 2021-09-26 DIAGNOSIS — J9811 Atelectasis: Secondary | ICD-10-CM | POA: Diagnosis not present

## 2021-09-26 DIAGNOSIS — J449 Chronic obstructive pulmonary disease, unspecified: Secondary | ICD-10-CM | POA: Insufficient documentation

## 2021-09-26 DIAGNOSIS — Z87891 Personal history of nicotine dependence: Secondary | ICD-10-CM | POA: Insufficient documentation

## 2021-09-26 DIAGNOSIS — G4733 Obstructive sleep apnea (adult) (pediatric): Secondary | ICD-10-CM | POA: Diagnosis not present

## 2021-09-26 HISTORY — DX: Diaphragmatic hernia without obstruction or gangrene: K44.9

## 2021-09-26 HISTORY — DX: Chronic kidney disease, stage 3 unspecified: N18.30

## 2021-09-26 HISTORY — DX: Atherosclerotic heart disease of native coronary artery without angina pectoris: I25.10

## 2021-09-26 HISTORY — PX: XI ROBOTIC ASSISTED PARAESOPHAGEAL HERNIA REPAIR: SHX6871

## 2021-09-26 HISTORY — DX: Cardiomegaly: I51.7

## 2021-09-26 HISTORY — DX: Type 2 diabetes mellitus with diabetic neuropathy, unspecified: E11.40

## 2021-09-26 HISTORY — DX: Obstructive sleep apnea (adult) (pediatric): G47.33

## 2021-09-26 HISTORY — DX: Fatty (change of) liver, not elsewhere classified: K76.0

## 2021-09-26 HISTORY — DX: Left anterior fascicular block: I44.4

## 2021-09-26 HISTORY — PX: INSERTION OF MESH: SHX5868

## 2021-09-26 HISTORY — DX: Type 2 diabetes mellitus without complications: E11.9

## 2021-09-26 HISTORY — DX: Atherosclerosis of aorta: I70.0

## 2021-09-26 HISTORY — DX: Umbilical hernia without obstruction or gangrene: K42.9

## 2021-09-26 HISTORY — DX: Gastro-esophageal reflux disease without esophagitis: K21.9

## 2021-09-26 HISTORY — DX: Depression, unspecified: F32.A

## 2021-09-26 HISTORY — DX: Unspecified osteoarthritis, unspecified site: M19.90

## 2021-09-26 HISTORY — DX: Chronic obstructive pulmonary disease, unspecified: J44.9

## 2021-09-26 HISTORY — DX: Anxiety disorder, unspecified: F41.9

## 2021-09-26 HISTORY — DX: Chronic pain syndrome: G89.4

## 2021-09-26 LAB — CBC
HCT: 35.5 % — ABNORMAL LOW (ref 39.0–52.0)
Hemoglobin: 11.3 g/dL — ABNORMAL LOW (ref 13.0–17.0)
MCH: 28.8 pg (ref 26.0–34.0)
MCHC: 31.8 g/dL (ref 30.0–36.0)
MCV: 90.3 fL (ref 80.0–100.0)
Platelets: 216 10*3/uL (ref 150–400)
RBC: 3.93 MIL/uL — ABNORMAL LOW (ref 4.22–5.81)
RDW: 14.1 % (ref 11.5–15.5)
WBC: 18.8 10*3/uL — ABNORMAL HIGH (ref 4.0–10.5)
nRBC: 0 % (ref 0.0–0.2)

## 2021-09-26 LAB — GLUCOSE, CAPILLARY
Glucose-Capillary: 247 mg/dL — ABNORMAL HIGH (ref 70–99)
Glucose-Capillary: 309 mg/dL — ABNORMAL HIGH (ref 70–99)
Glucose-Capillary: 335 mg/dL — ABNORMAL HIGH (ref 70–99)
Glucose-Capillary: 235 mg/dL — ABNORMAL HIGH (ref 70–99)
Glucose-Capillary: 217 mg/dL — ABNORMAL HIGH (ref 70–99)

## 2021-09-26 LAB — CREATININE, SERUM
Creatinine, Ser: 1.57 mg/dL — ABNORMAL HIGH (ref 0.61–1.24)
GFR, Estimated: 46 mL/min — ABNORMAL LOW (ref >60)

## 2021-09-26 SURGERY — REPAIR, HERNIA, PARAESOPHAGEAL, ROBOT-ASSISTED
Anesthesia: General

## 2021-09-26 MED ORDER — PROPOFOL 10 MG/ML IV BOLUS
INTRAVENOUS | Status: AC
  Filled 2021-09-26: qty 20

## 2021-09-26 MED ORDER — BUPIVACAINE-EPINEPHRINE 0.25% -1:200000 IJ SOLN
INTRAMUSCULAR | Status: DC | PRN
  Administered 2021-09-26: 30 mL

## 2021-09-26 MED ORDER — INSULIN ASPART 100 UNIT/ML IJ SOLN: 10.0000 [IU] | Freq: Once | INTRAMUSCULAR | Status: AC

## 2021-09-26 MED ORDER — SUGAMMADEX SODIUM 500 MG/5ML IV SOLN
INTRAVENOUS | Status: DC | PRN
  Administered 2021-09-26: 250 mg via INTRAVENOUS

## 2021-09-26 MED ORDER — BUPIVACAINE LIPOSOME 1.3 % IJ SUSP
INTRAMUSCULAR | Status: AC
  Filled 2021-09-26: qty 20

## 2021-09-26 MED ORDER — VASOPRESSIN 20 UNIT/ML IV SOLN
INTRAVENOUS | Status: DC | PRN
  Administered 2021-09-26 (×10): 2 [IU] via INTRAVENOUS

## 2021-09-26 MED ORDER — ONDANSETRON HCL 4 MG/2ML IJ SOLN
INTRAMUSCULAR | Status: DC | PRN
Start: 2021-09-26 — End: 2021-09-26
  Administered 2021-09-26: 4 mg via INTRAVENOUS

## 2021-09-26 MED ORDER — MIDAZOLAM HCL 2 MG/2ML IJ SOLN
INTRAMUSCULAR | Status: DC | PRN
  Administered 2021-09-26: 1 mg via INTRAVENOUS

## 2021-09-26 MED ORDER — ESCITALOPRAM OXALATE 20 MG PO TABS
20.0000 mg | ORAL_TABLET | Freq: Every day | ORAL | Status: DC
  Administered 2021-09-27 – 2021-09-28 (×2): 20 mg via ORAL
  Filled 2021-09-26 (×2): qty 1

## 2021-09-26 MED ORDER — SODIUM CHLORIDE 0.9 % IV SOLN
INTRAVENOUS | Status: DC | PRN
  Administered 2021-09-26: 50 ug/min via INTRAVENOUS

## 2021-09-26 MED ORDER — PROPOFOL 10 MG/ML IV BOLUS
INTRAVENOUS | Status: DC | PRN
  Administered 2021-09-26: 150 mg via INTRAVENOUS

## 2021-09-26 MED ORDER — FENTANYL CITRATE (PF) 100 MCG/2ML IJ SOLN
INTRAMUSCULAR | Status: DC | PRN
  Administered 2021-09-26 (×3): 25 ug via INTRAVENOUS

## 2021-09-26 MED ORDER — VASOPRESSIN 20 UNIT/ML IV SOLN
INTRAVENOUS | Status: AC
  Filled 2021-09-26: qty 1

## 2021-09-26 MED ORDER — INSULIN ASPART 100 UNIT/ML IJ SOLN
4.0000 [IU] | Freq: Three times a day (TID) | INTRAMUSCULAR | Status: DC
  Administered 2021-09-26 – 2021-09-28 (×5): 4 [IU] via SUBCUTANEOUS
  Filled 2021-09-26 (×6): qty 1

## 2021-09-26 MED ORDER — INSULIN ASPART 100 UNIT/ML IJ SOLN
0.0000 [IU] | Freq: Every day | INTRAMUSCULAR | Status: DC
  Administered 2021-09-26: 2 [IU] via SUBCUTANEOUS

## 2021-09-26 MED ORDER — IPRATROPIUM-ALBUTEROL 0.5-2.5 (3) MG/3ML IN SOLN: 3.0000 mL | Freq: Four times a day (QID) | RESPIRATORY_TRACT | Status: DC | PRN

## 2021-09-26 MED ORDER — PHENYLEPHRINE HCL (PRESSORS) 10 MG/ML IV SOLN
INTRAVENOUS | Status: AC
  Filled 2021-09-26: qty 1

## 2021-09-26 MED ORDER — DEXAMETHASONE SODIUM PHOSPHATE 10 MG/ML IJ SOLN
INTRAMUSCULAR | Status: AC
  Filled 2021-09-26: qty 1

## 2021-09-26 MED ORDER — LIDOCAINE HCL (CARDIAC) PF 100 MG/5ML IV SOSY
PREFILLED_SYRINGE | INTRAVENOUS | Status: DC | PRN
  Administered 2021-09-26: 100 mg via INTRAVENOUS

## 2021-09-26 MED ORDER — ROCURONIUM BROMIDE 10 MG/ML (PF) SYRINGE
PREFILLED_SYRINGE | INTRAVENOUS | Status: AC
  Filled 2021-09-26: qty 10

## 2021-09-26 MED ORDER — ROCURONIUM BROMIDE 100 MG/10ML IV SOLN
INTRAVENOUS | Status: DC | PRN
  Administered 2021-09-26: 20 mg via INTRAVENOUS
  Administered 2021-09-26: 30 mg via INTRAVENOUS
  Administered 2021-09-26: 50 mg via INTRAVENOUS
  Administered 2021-09-26: 30 mg via INTRAVENOUS

## 2021-09-26 MED ORDER — FENTANYL CITRATE (PF) 100 MCG/2ML IJ SOLN
INTRAMUSCULAR | Status: AC
  Filled 2021-09-26: qty 2

## 2021-09-26 MED ORDER — INSULIN ASPART 100 UNIT/ML IJ SOLN
INTRAMUSCULAR | Status: AC
  Administered 2021-09-26: 10 [IU] via SUBCUTANEOUS
  Filled 2021-09-26: qty 1

## 2021-09-26 MED ORDER — BUPIVACAINE-EPINEPHRINE (PF) 0.25% -1:200000 IJ SOLN
INTRAMUSCULAR | Status: AC
  Filled 2021-09-26: qty 30

## 2021-09-26 MED ORDER — ACETAMINOPHEN 500 MG PO TABS
ORAL_TABLET | ORAL | Status: AC
  Administered 2021-09-26: 1000 mg via ORAL
  Filled 2021-09-26: qty 2

## 2021-09-26 MED ORDER — SODIUM CHLORIDE 0.9 % IV SOLN
INTRAVENOUS | Status: DC | PRN
Start: 2021-09-26 — End: 2021-09-26

## 2021-09-26 MED ORDER — VISTASEAL 10 ML SINGLE DOSE KIT
PACK | CUTANEOUS | Status: AC
  Filled 2021-09-26: qty 10

## 2021-09-26 MED ORDER — CEFAZOLIN SODIUM-DEXTROSE 2-4 GM/100ML-% IV SOLN
2.0000 g | Freq: Three times a day (TID) | INTRAVENOUS | Status: DC
  Administered 2021-09-26 – 2021-09-28 (×6): 2 g via INTRAVENOUS
  Filled 2021-09-26 (×10): qty 100

## 2021-09-26 MED ORDER — LIDOCAINE HCL (PF) 2 % IJ SOLN
INTRAMUSCULAR | Status: AC
  Filled 2021-09-26: qty 5

## 2021-09-26 MED ORDER — CEFAZOLIN SODIUM 1 G IJ SOLR
INTRAMUSCULAR | Status: AC
  Filled 2021-09-26: qty 20

## 2021-09-26 MED ORDER — PROCHLORPERAZINE EDISYLATE 10 MG/2ML IJ SOLN: 5.0000 mg | Freq: Four times a day (QID) | INTRAMUSCULAR | Status: DC | PRN

## 2021-09-26 MED ORDER — ONDANSETRON 4 MG PO TBDP: 4.0000 mg | ORAL_TABLET | Freq: Four times a day (QID) | ORAL | Status: DC | PRN

## 2021-09-26 MED ORDER — BUPIVACAINE LIPOSOME 1.3 % IJ SUSP
INTRAMUSCULAR | Status: DC | PRN
  Administered 2021-09-26: 20 mL

## 2021-09-26 MED ORDER — FENTANYL CITRATE (PF) 100 MCG/2ML IJ SOLN
25.0000 ug | INTRAMUSCULAR | Status: DC | PRN
  Administered 2021-09-26: 50 ug via INTRAVENOUS

## 2021-09-26 MED ORDER — PHENYLEPHRINE HCL (PRESSORS) 10 MG/ML IV SOLN
INTRAVENOUS | Status: DC | PRN
  Administered 2021-09-26: 100 ug via INTRAVENOUS
  Administered 2021-09-26: 200 ug via INTRAVENOUS
  Administered 2021-09-26: 100 ug via INTRAVENOUS

## 2021-09-26 MED ORDER — SUGAMMADEX SODIUM 500 MG/5ML IV SOLN
INTRAVENOUS | Status: AC
  Filled 2021-09-26: qty 5

## 2021-09-26 MED ORDER — GABAPENTIN 600 MG PO TABS
1200.0000 mg | ORAL_TABLET | Freq: Two times a day (BID) | ORAL | Status: DC
  Administered 2021-09-26 – 2021-09-28 (×4): 1200 mg via ORAL
  Filled 2021-09-26 (×4): qty 2

## 2021-09-26 MED ORDER — INSULIN ASPART 100 UNIT/ML IJ SOLN
0.0000 [IU] | Freq: Three times a day (TID) | INTRAMUSCULAR | Status: DC
  Administered 2021-09-26: 5 [IU] via SUBCUTANEOUS
  Administered 2021-09-27: 3 [IU] via SUBCUTANEOUS
  Administered 2021-09-27: 2 [IU] via SUBCUTANEOUS
  Administered 2021-09-27 – 2021-09-28 (×3): 3 [IU] via SUBCUTANEOUS
  Filled 2021-09-26 (×6): qty 1

## 2021-09-26 MED ORDER — ALPRAZOLAM 0.25 MG PO TABS
0.1250 mg | ORAL_TABLET | Freq: Every day | ORAL | Status: DC | PRN
  Administered 2021-09-28: 0.25 mg via ORAL
  Filled 2021-09-26: qty 1

## 2021-09-26 MED ORDER — PROCHLORPERAZINE MALEATE 10 MG PO TABS
10.0000 mg | ORAL_TABLET | Freq: Four times a day (QID) | ORAL | Status: DC | PRN
  Filled 2021-09-26: qty 1

## 2021-09-26 MED ORDER — EPHEDRINE SULFATE 50 MG/ML IJ SOLN
INTRAMUSCULAR | Status: DC | PRN
  Administered 2021-09-26 (×2): 5 mg via INTRAVENOUS

## 2021-09-26 MED ORDER — BUDESONIDE 0.25 MG/2ML IN SUSP: 0.2500 mg | Freq: Every day | RESPIRATORY_TRACT | Status: DC | PRN

## 2021-09-26 MED ORDER — ENOXAPARIN SODIUM 40 MG/0.4ML IJ SOSY
40.0000 mg | PREFILLED_SYRINGE | INTRAMUSCULAR | Status: DC
  Administered 2021-09-27 – 2021-09-28 (×2): 40 mg via SUBCUTANEOUS
  Filled 2021-09-26 (×2): qty 0.4

## 2021-09-26 MED ORDER — SODIUM CHLORIDE 0.9 % IV SOLN: INTRAVENOUS | Status: DC

## 2021-09-26 MED ORDER — ACETAMINOPHEN 500 MG PO TABS
1000.0000 mg | ORAL_TABLET | Freq: Four times a day (QID) | ORAL | Status: DC
  Administered 2021-09-26 – 2021-09-28 (×8): 1000 mg via ORAL
  Filled 2021-09-26 (×8): qty 2

## 2021-09-26 MED ORDER — MORPHINE SULFATE (PF) 4 MG/ML IV SOLN
4.0000 mg | INTRAVENOUS | Status: DC | PRN
  Administered 2021-09-26: 4 mg via INTRAVENOUS
  Filled 2021-09-26: qty 1

## 2021-09-26 MED ORDER — HEPARIN SODIUM (PORCINE) 5000 UNIT/ML IJ SOLN
INTRAMUSCULAR | Status: AC
  Administered 2021-09-26: 5000 [IU] via SUBCUTANEOUS
  Filled 2021-09-26: qty 1

## 2021-09-26 MED ORDER — GABAPENTIN 300 MG PO CAPS
ORAL_CAPSULE | ORAL | Status: AC
  Filled 2021-09-26: qty 1

## 2021-09-26 MED ORDER — HYDRALAZINE HCL 20 MG/ML IJ SOLN: 10.0000 mg | INTRAMUSCULAR | Status: DC | PRN

## 2021-09-26 MED ORDER — OXYCODONE HCL 5 MG PO TABS
5.0000 mg | ORAL_TABLET | ORAL | Status: DC | PRN
  Administered 2021-09-26 – 2021-09-28 (×5): 10 mg via ORAL
  Filled 2021-09-26 (×5): qty 2

## 2021-09-26 MED ORDER — BUSPIRONE HCL 15 MG PO TABS
30.0000 mg | ORAL_TABLET | Freq: Two times a day (BID) | ORAL | Status: DC
  Administered 2021-09-26 – 2021-09-28 (×4): 30 mg via ORAL
  Filled 2021-09-26 (×5): qty 2

## 2021-09-26 MED ORDER — ONDANSETRON HCL 4 MG/2ML IJ SOLN: 4.0000 mg | Freq: Four times a day (QID) | INTRAMUSCULAR | Status: DC | PRN

## 2021-09-26 MED ORDER — VISTASEAL 10 ML SINGLE DOSE KIT
PACK | CUTANEOUS | Status: DC | PRN
  Administered 2021-09-26: 10 mL via TOPICAL

## 2021-09-26 MED ORDER — ONDANSETRON HCL 4 MG/2ML IJ SOLN
4.0000 mg | Freq: Once | INTRAMUSCULAR | Status: DC | PRN
Start: 2021-09-26 — End: 2021-09-26

## 2021-09-26 MED ORDER — CHLORHEXIDINE GLUCONATE 0.12 % MT SOLN
OROMUCOSAL | Status: AC
  Administered 2021-09-26: 15 mL via OROMUCOSAL
  Filled 2021-09-26: qty 15

## 2021-09-26 MED ORDER — MIDAZOLAM HCL 2 MG/2ML IJ SOLN
INTRAMUSCULAR | Status: AC
  Filled 2021-09-26: qty 2

## 2021-09-26 MED ORDER — CEFAZOLIN SODIUM-DEXTROSE 2-4 GM/100ML-% IV SOLN
INTRAVENOUS | Status: AC
  Filled 2021-09-26: qty 100

## 2021-09-26 SURGICAL SUPPLY — 67 items
APPLICATOR VISTASEAL 35 (MISCELLANEOUS) ×3 IMPLANT
BULB RESERV EVAC DRAIN JP 100C (MISCELLANEOUS) ×3 IMPLANT
CANNULA REDUC XI 12-8 STAPL (CANNULA) ×1
CANNULA REDUCER 12-8 DVNC XI (CANNULA) ×2 IMPLANT
CHLORAPREP W/TINT 26 (MISCELLANEOUS) ×3 IMPLANT
DECANTER SPIKE VIAL GLASS SM (MISCELLANEOUS) ×3 IMPLANT
DEFOGGER SCOPE WARMER CLEARIFY (MISCELLANEOUS) ×3 IMPLANT
DERMABOND ADVANCED (GAUZE/BANDAGES/DRESSINGS) ×1
DERMABOND ADVANCED .7 DNX12 (GAUZE/BANDAGES/DRESSINGS) ×2 IMPLANT
DRAIN CHANNEL 19F RND (DRAIN) ×3 IMPLANT
DRAPE 3/4 80X56 (DRAPES) IMPLANT
DRAPE ARM DVNC X/XI (DISPOSABLE) ×8 IMPLANT
DRAPE COLUMN DVNC XI (DISPOSABLE) ×2 IMPLANT
DRAPE DA VINCI XI ARM (DISPOSABLE) ×4
DRAPE DA VINCI XI COLUMN (DISPOSABLE) ×1
DRSG TEGADERM 4X4.75 (GAUZE/BANDAGES/DRESSINGS) ×3 IMPLANT
ELECT CAUTERY BLADE 6.4 (BLADE) ×3 IMPLANT
ELECT REM PT RETURN 9FT ADLT (ELECTROSURGICAL) ×3
ELECTRODE REM PT RTRN 9FT ADLT (ELECTROSURGICAL) ×2 IMPLANT
GAUZE 4X4 16PLY ~~LOC~~+RFID DBL (SPONGE) ×3 IMPLANT
GLOVE SURG ENC MOIS LTX SZ7 (GLOVE) ×30 IMPLANT
GOWN STRL REUS W/ TWL LRG LVL3 (GOWN DISPOSABLE) ×12 IMPLANT
GOWN STRL REUS W/TWL LRG LVL3 (GOWN DISPOSABLE) ×6
GRASPER LAPSCPC 5X45 DSP (INSTRUMENTS) ×3 IMPLANT
IRRIGATION STRYKERFLOW (MISCELLANEOUS) ×2 IMPLANT
IRRIGATOR STRYKERFLOW (MISCELLANEOUS) ×3
IV NS 1000ML (IV SOLUTION) ×1
IV NS 1000ML BAXH (IV SOLUTION) ×2 IMPLANT
KIT PINK PAD W/HEAD ARE REST (MISCELLANEOUS) ×3
KIT PINK PAD W/HEAD ARM REST (MISCELLANEOUS) ×2 IMPLANT
KIT TURNOVER CYSTO (KITS) ×3 IMPLANT
LABEL OR SOLS (LABEL) IMPLANT
MANIFOLD NEPTUNE II (INSTRUMENTS) ×3 IMPLANT
MESH BIO-A 7X10 SYN MAT (Mesh General) ×3 IMPLANT
NEEDLE HYPO 22GX1.5 SAFETY (NEEDLE) ×3 IMPLANT
OBTURATOR OPTICAL STANDARD 8MM (TROCAR) ×1
OBTURATOR OPTICAL STND 8 DVNC (TROCAR) ×2
OBTURATOR OPTICALSTD 8 DVNC (TROCAR) ×2 IMPLANT
PACK LAP CHOLECYSTECTOMY (MISCELLANEOUS) ×3 IMPLANT
PENCIL ELECTRO HAND CTR (MISCELLANEOUS) ×3 IMPLANT
POUCH SPECIMEN RETRIEVAL 10MM (ENDOMECHANICALS) ×3 IMPLANT
SEAL CANN UNIV 5-8 DVNC XI (MISCELLANEOUS) ×6 IMPLANT
SEAL XI 5MM-8MM UNIVERSAL (MISCELLANEOUS) ×3
SEALER VESSEL DA VINCI XI (MISCELLANEOUS) ×1
SEALER VESSEL EXT DVNC XI (MISCELLANEOUS) ×2 IMPLANT
SOLUTION ELECTROLUBE (MISCELLANEOUS) ×3 IMPLANT
SPONGE DRAIN TRACH 4X4 STRL 2S (GAUZE/BANDAGES/DRESSINGS) ×3 IMPLANT
SPONGE T-LAP 18X18 ~~LOC~~+RFID (SPONGE) ×3 IMPLANT
SPONGE T-LAP 4X18 ~~LOC~~+RFID (SPONGE) ×3 IMPLANT
STAPLER CANNULA SEAL DVNC XI (STAPLE) ×2 IMPLANT
STAPLER CANNULA SEAL XI (STAPLE) ×1
SUT ETHILON 3-0 FS-10 30 BLK (SUTURE) ×3
SUT MNCRL 4-0 (SUTURE) ×1
SUT MNCRL 4-0 27XMFL (SUTURE) ×2
SUT SILK 2 0 SH (SUTURE) ×12 IMPLANT
SUT VICRYL 0 AB UR-6 (SUTURE) ×9 IMPLANT
SUT VLOC 90 S/L VL9 GS22 (SUTURE) ×3 IMPLANT
SUTURE EHLN 3-0 FS-10 30 BLK (SUTURE) ×2 IMPLANT
SUTURE MNCRL 4-0 27XMF (SUTURE) ×2 IMPLANT
SYR 20ML LL LF (SYRINGE) ×3 IMPLANT
SYR 30ML LL (SYRINGE) ×3 IMPLANT
TAPE TRANSPORE STRL 2 31045 (GAUZE/BANDAGES/DRESSINGS) ×3 IMPLANT
TRAY FOLEY SLVR 16FR LF STAT (SET/KITS/TRAYS/PACK) ×3 IMPLANT
TROCAR BALLN GELPORT 12X130M (ENDOMECHANICALS) ×3 IMPLANT
TROCAR XCEL NON-BLD 5MMX100MML (ENDOMECHANICALS) ×3 IMPLANT
TUBING EVAC SMOKE HEATED PNEUM (TUBING) ×3 IMPLANT
WATER STERILE IRR 500ML POUR (IV SOLUTION) IMPLANT

## 2021-09-26 NOTE — Interval H&P Note (Signed)
History and Physical Interval Note:  09/26/2021 7:16 AM  Chris Mercer  has presented today for surgery, with the diagnosis of paraesophageal hernia.  The various methods of treatment have been discussed with the patient and family. After consideration of risks, benefits and other options for treatment, the patient has consented to  Procedure(s): XI ROBOTIC ASSISTED PARAESOPHAGEAL HERNIA REPAIR, RNFA to assist (N/A) as a surgical intervention.  The patient's history has been reviewed, patient examined, no change in status, stable for surgery.  I have reviewed the patient's chart and labs.  Questions were answered to the patient's satisfaction.     [place]

## 2021-09-26 NOTE — Transfer of Care (Signed)
Immediate Anesthesia Transfer of Care Note  Patient: Chris Mercer Mar  Procedure(s) Performed: XI ROBOTIC ASSISTED PARAESOPHAGEAL HERNIA REPAIR, RNFA to assist INSERTION OF MESH  Patient Location: PACU  Anesthesia Type:General  Level of Consciousness: sedated  Airway & Oxygen Therapy: Patient Spontanous Breathing and Patient connected to face mask oxygen  Post-op Assessment: Report given to RN and Post -op Vital signs reviewed and stable  Post vital signs: Reviewed and stable  Last Vitals:  Vitals Value Taken Time  BP 113/65 09/26/21 1300  Temp    Pulse 74 09/26/21 1305  Resp 23 09/26/21 1305  SpO2 99 % 09/26/21 1305  Vitals shown include unvalidated device data.  Last Pain:  Vitals:   09/26/21 0643  TempSrc: Temporal  PainSc: 0-No pain         Complications: No notable events documented.

## 2021-09-26 NOTE — Anesthesia Preprocedure Evaluation (Signed)
Anesthesia Evaluation  Patient identified by MRN, date of birth, ID band Patient awake    Reviewed: Allergy & Precautions, NPO status , Patient's Chart, lab work & pertinent test results  History of Anesthesia Complications Negative for: history of anesthetic complications  Airway Mallampati: III  TM Distance: >3 FB Neck ROM: full    Dental  (+) Edentulous Upper, Poor Dentition, Missing, Dental Advidsory Given,    Pulmonary neg shortness of breath, sleep apnea , COPD, neg recent URI, former smoker,    Pulmonary exam normal        Cardiovascular Exercise Tolerance: Poor hypertension, (-) angina+ CAD and + Cardiac Stents  (-) Past MI Normal cardiovascular exam+ dysrhythmias (LAFB) (-) Valvular Problems/Murmurs     Neuro/Psych neg Seizures PSYCHIATRIC DISORDERS (Chronic pain syndrome) Anxiety Depression  Neuromuscular disease (Neuropathy)    GI/Hepatic Neg liver ROS, hiatal hernia, GERD  ,  Endo/Other  negative endocrine ROSdiabetes, Type 2  Renal/GU CRFRenal disease  negative genitourinary   Musculoskeletal   Abdominal (+) + obese,   Peds  Hematology negative hematology ROS (+)   Anesthesia Other Findings Past Medical History: No date: Diabetes mellitus without complication (HCC) No date: HLD (hyperlipidemia) No date: Hypertension No date: Neuropathy  Past Surgical History: No date: AMPUTATION TOE No date: APPENDECTOMY No date: CORONARY ANGIOPLASTY WITH STENT PLACEMENT No date: HERNIA REPAIR     Comment:  x3     Reproductive/Obstetrics negative OB ROS                             Anesthesia Physical  Anesthesia Plan  ASA: 3  Anesthesia Plan: General   Post-op Pain Management:    Induction: Intravenous  PONV Risk Score and Plan: Ondansetron, Dexamethasone and Treatment may vary due to age or medical condition  Airway Management Planned: Oral ETT  Additional Equipment:    Intra-op Plan:   Post-operative Plan: Extubation in OR  Informed Consent: I have reviewed the patients History and Physical, chart, labs and discussed the procedure including the risks, benefits and alternatives for the proposed anesthesia with the patient or authorized representative who has indicated his/her understanding and acceptance.     Dental Advisory Given  Plan Discussed with: Anesthesiologist, CRNA and Surgeon  Anesthesia Plan Comments:         Anesthesia Quick Evaluation

## 2021-09-26 NOTE — Op Note (Signed)
Robotic assisted laparoscopic Nissen fundoplication w repair of  Type III paraesophageal hernia hernia  Pre-operative Diagnosis: GERD, paraesophageal  hernia  Post-operative Diagnosis: same  Procedure:  Robotic assisted laparoscopic Nissen fundoplication w repair of  hiatal hernia  Surgeon: Caroleen Hamman, MD FACS  Assistant: Wylene Simmer PA-C. Required due to the complexity of the case the need for exposure,  and lack of first assist.  Anesthesia: Gen. with endotracheal tube  Findings: Type III paraesophageal hernia hernia with at least 1/2 of the stomach within the mediastinum Significant adhesions from the omentum to the abdominal wall from prior surgeries Loose wrap 360 degree over 50 FR Bougie   Estimated Blood Loss: 25cc              Complications: none   Procedure Details  The patient was seen again in the Holding Room. The benefits, complications, treatment options, and expected outcomes were discussed with the patient. The risks of bleeding, infection, recurrence of symptoms, failure to resolve symptoms,  esophageal damage, Dysphagia, bowel injury, any of which could require further surgery were reviewed with the patient. The likelihood of improving the patient's symptoms with return to their baseline status is good.  The patient and/or family concurred with the proposed plan, giving informed consent.  The patient was taken to Operating Room, identified  and the procedure verified.  A Time Out was held and the above information confirmed.  Prior to the induction of general anesthesia, antibiotic prophylaxis was administered. VTE prophylaxis was in place. General endotracheal anesthesia was then administered and tolerated well. After the induction, the abdomen was prepped with Chloraprep and draped in the sterile fashion. The patient was positioned in the supine position.  Cut down technique was used to enter the abdominal cavity and a Hasson trochar was placed after two vicryl  stitches were anchored to the fascia. Pneumoperitoneum was then created with CO2 and tolerated well without any adverse changes in the patient's vital signs.  Three 8-mm ports were placed under direct vision. Significant adhesions  Encountered and they were lysed bluntly to allow a good working space.  All skin incisions  were infiltrated with a local anesthetic agent before making the incision and placing the trocars. An additional 5 mm regular laparoscopic port was placed to assist with retraction and exposure.   The patient was positioned  in reverse Trendelenburg, robot was brought to the surgical field and docked in the standard fashion.  We made sure all the instrumentation was kept indirect view at all times and that there were no collision between the arms. I scrubbed out and went to the console.  I used a robotic arm to retract the liver, the vessel sealer on my right hand and a forced bipolar grasper on my left hand.  There is along the extra 5 mm port allow me ample exposure and the ability to perform meticulous dissection  We Started dividing the lesser omentum via the pars flaccida.  We Were able to dissect the lesser curvature of the stomach and  dissected the fundus free from the right and left crus.  We circumferentially dissected the GE junction.  The hernia sac was also completely reduced and we were able to bring the stomach into the intra-abdominal position.  Attention then was turned to the greater curvature where the short gastrics were divided with sealer device.  We were able to identify the left crus and again were able to make sure there was a good circumferential dissection and that the  hernia sac was completely excised.  Sac was completely excised. SNo evidence of esophageal or mediastinal injuries observed.We did perform a good dissection within the mediastinum to allow a complete reduction of the sac and a to completely allow an intra-abdominal Nissen fundoplication. There was  a small rent on the right pleura due to significant adhesions from the hernia sac, I placed 19 blake transabdominal drain within the mediastinum to prevent PTX. Using two stripes of bioA as pledgets the crus  was closed with a running 2-0 V lock suture.  We Asked anesthesia to place a 50 French bougie and this went easily.  We also observe trajectory of the bougie. 360 degree Nissen fundoplication was created with multiple 2-0 silk sutures and we placed 3 stitches taking some of the esophagus within that bite.  The fundoplication measured approximately 3-1/2 cm and he was floppy. I was very happy with the way the fundoplication laid and the repair of the hernia.  Inspection of the  upper quadrant was performed. No bleeding, bile  Or esophageal injuries leaks, or bowel injuries were noted. Robotic instruments and robotic arms were undocked in the standard fashion. All the needles were removed under direct visualization.   I scrubbed back in.  Pneumoperitoneum was released.  The periumbilical port site was closed with interrumpted 0 Vicryl sutures. 4-0 subcuticular Monocryl was used to close the skin. Liposomal marcaine was injected to all the incisions sites.  Dermabond was  applied.  The patient was then extubated and brought to the recovery room in stable condition. Sponge, lap, and needle counts were correct at closure and at the conclusion of the case.               Caroleen Hamman, MD, FACS

## 2021-09-26 NOTE — Anesthesia Procedure Notes (Signed)
Procedure Name: Intubation Date/Time: 09/26/2021 7:48 AM Performed by: Johnna Acosta, CRNA Pre-anesthesia Checklist: Patient identified, Emergency Drugs available, Suction available, Patient being monitored and Timeout performed Patient Re-evaluated:Patient Re-evaluated prior to induction Oxygen Delivery Method: Circle system utilized Preoxygenation: Pre-oxygenation with 100% oxygen Induction Type: IV induction Ventilation: Mask ventilation with difficulty and Oral airway inserted - appropriate to patient size Laryngoscope Size: McGraph and 4 Grade View: Grade I Tube type: Oral Tube size: 7.5 mm Number of attempts: 1 Airway Equipment and Method: Stylet, Video-laryngoscopy and Oral airway Placement Confirmation: ETT inserted through vocal cords under direct vision, positive ETCO2 and breath sounds checked- equal and bilateral Secured at: 22 cm Tube secured with: Tape Dental Injury: Teeth and Oropharynx as per pre-operative assessment

## 2021-09-27 ENCOUNTER — Encounter: Payer: Self-pay | Admitting: Surgery

## 2021-09-27 DIAGNOSIS — I10 Essential (primary) hypertension: Secondary | ICD-10-CM | POA: Diagnosis not present

## 2021-09-27 DIAGNOSIS — K449 Diaphragmatic hernia without obstruction or gangrene: Secondary | ICD-10-CM | POA: Diagnosis not present

## 2021-09-27 DIAGNOSIS — Z87891 Personal history of nicotine dependence: Secondary | ICD-10-CM | POA: Diagnosis not present

## 2021-09-27 DIAGNOSIS — J449 Chronic obstructive pulmonary disease, unspecified: Secondary | ICD-10-CM | POA: Diagnosis not present

## 2021-09-27 DIAGNOSIS — E119 Type 2 diabetes mellitus without complications: Secondary | ICD-10-CM | POA: Diagnosis not present

## 2021-09-27 LAB — SURGICAL PATHOLOGY

## 2021-09-27 LAB — BASIC METABOLIC PANEL
Anion gap: 7 (ref 5–15)
BUN: 21 mg/dL (ref 8–23)
CO2: 24 mmol/L (ref 22–32)
Calcium: 8.3 mg/dL — ABNORMAL LOW (ref 8.9–10.3)
Chloride: 106 mmol/L (ref 98–111)
Creatinine, Ser: 1.31 mg/dL — ABNORMAL HIGH (ref 0.61–1.24)
GFR, Estimated: 57 mL/min — ABNORMAL LOW (ref >60)
Glucose, Bld: 119 mg/dL — ABNORMAL HIGH (ref 70–99)
Potassium: 4 mmol/L (ref 3.5–5.1)
Sodium: 137 mmol/L (ref 135–145)

## 2021-09-27 LAB — CBC
HCT: 34.4 % — ABNORMAL LOW (ref 39.0–52.0)
Hemoglobin: 10.6 g/dL — ABNORMAL LOW (ref 13.0–17.0)
MCH: 27.8 pg (ref 26.0–34.0)
MCHC: 30.8 g/dL (ref 30.0–36.0)
MCV: 90.3 fL (ref 80.0–100.0)
Platelets: 132 10*3/uL — ABNORMAL LOW (ref 150–400)
RBC: 3.81 MIL/uL — ABNORMAL LOW (ref 4.22–5.81)
RDW: 14.4 % (ref 11.5–15.5)
WBC: 9.7 10*3/uL (ref 4.0–10.5)
nRBC: 0 % (ref 0.0–0.2)

## 2021-09-27 LAB — GLUCOSE, CAPILLARY
Glucose-Capillary: 131 mg/dL — ABNORMAL HIGH (ref 70–99)
Glucose-Capillary: 152 mg/dL — ABNORMAL HIGH (ref 70–99)
Glucose-Capillary: 199 mg/dL — ABNORMAL HIGH (ref 70–99)
Glucose-Capillary: 199 mg/dL — ABNORMAL HIGH (ref 70–99)

## 2021-09-27 LAB — HEMOGLOBIN A1C
Hgb A1c MFr Bld: 8.9 % — ABNORMAL HIGH (ref 4.8–5.6)
Mean Plasma Glucose: 209 mg/dL

## 2021-09-27 NOTE — Anesthesia Postprocedure Evaluation (Signed)
Anesthesia Post Note  Patient: Chris Mercer  Procedure(s) Performed: XI ROBOTIC ASSISTED PARAESOPHAGEAL HERNIA REPAIR, RNFA to assist INSERTION OF MESH  Patient location during evaluation: PACU Anesthesia Type: General Level of consciousness: awake and alert Pain management: pain level controlled Vital Signs Assessment: post-procedure vital signs reviewed and stable Respiratory status: spontaneous breathing, nonlabored ventilation, respiratory function stable and patient connected to nasal cannula oxygen Cardiovascular status: blood pressure returned to baseline and stable Postop Assessment: no apparent nausea or vomiting Anesthetic complications: no   No notable events documented.   Last Vitals:  Vitals:   09/27/21 0340 09/27/21 0732  BP: 133/78 120/66  Pulse: 80 81  Resp: 18 18  Temp: 36.5 C 37.8 C  SpO2: 98% 90%    Last Pain:  Vitals:   09/27/21 1252  TempSrc:   PainSc: 7                  Martha Clan

## 2021-09-27 NOTE — Progress Notes (Signed)
[facility] Hospital Day(s): 0.   Post op day(s): 1 Day Post-Op.   Interval History:  Patient seen and examined No acute events or new complaints overnight.  Patient reports he has some abdominal soreness No fever, chills, nausea, emesis  Leukocytosis seen post-operatively is now resolved; down to 9.7K Hgb stable at 10.6 Renal function improving some; sCr - 1.31; UO - 715 ccs + unmeasured  No electrolyte derangements Surgical drain with 350 ccs; serosanguinous He is tolerating full liquids; no trouble swallowing   Vital signs in last 24 hours: [min-max] current  Temp:  [97 F (36.1 C)-100 F (37.8 C)] 100 F (37.8 C) (09/30 0732) Pulse Rate:  [73-87] 81 (09/30 0732) Resp:  [16-24] 18 (09/30 0732) BP: (107-144)/(58-87) 120/66 (09/30 0732) SpO2:  [90 %-100 %] 90 % (09/30 0732)     Height: 6' (182.9 cm) Weight: 107 kg BMI (Calculated): 32   Intake/Output last 2 shifts:  09/29 0701 - 09/30 0700 In: 2878.5 [P.O.:600; I.V.:1878.5; IV Piggyback:400] Out: 1090 [Urine:715; Drains:350; Blood:25]   Physical Exam:  Constitutional: alert, cooperative and no distress  Respiratory: breathing non-labored at rest  Cardiovascular: regular rate and sinus rhythm  Gastrointestinal: Soft, incisional soreness, non-distended, no rebound/guarding. Drain in left mid abdomen, output serosanguinous (removed) Integumentary: Laparoscopic incisions are CDI with dermabond, no erythema or drainage   Labs:  CBC Latest Ref Rng & Units 09/27/2021 09/26/2021 09/19/2021  WBC 4.0 - 10.5 K/uL 9.7 18.8(H) 8.8  Hemoglobin 13.0 - 17.0 g/dL 10.6(L) 11.3(L) 12.1(L)  Hematocrit 39.0 - 52.0 % 34.4(L) 35.5(L) 36.0(L)  Platelets 150 - 400 K/uL 132(L) 216 187   CMP Latest Ref Rng & Units 09/27/2021 09/26/2021 09/19/2021  Glucose 70 - 99 mg/dL 119(H) - 185(H)  BUN 8 - 23 mg/dL 21 - 21  Creatinine 0.61 - 1.24 mg/dL 1.31(H) 1.57(H) 1.12  Sodium 135 - 145 mmol/L 137 - 138  Potassium  3.5 - 5.1 mmol/L 4.0 - 3.7  Chloride 98 - 111 mmol/L 106 - 105  CO2 22 - 32 mmol/L 24 - 24  Calcium 8.9 - 10.3 mg/dL 8.3(L) - 9.6  Total Protein 6.5 - 8.1 g/dL - - 6.7  Total Bilirubin 0.3 - 1.2 mg/dL - - 0.3  Alkaline Phos 38 - 126 U/L - - 92  AST 15 - 41 U/L - - 23  ALT 0 - 44 U/L - - 28     Imaging studies: No new pertinent imaging studies   Assessment/Plan:  75 y.o. male 1 Day Post-Op s/p robotic assisted laparoscopic paraesophageal hernia repair with Nissen Fundoplication.   - Continue full liquid diet; reviewed Nissen diet recommendations and provided a handout - Wean from IVF - Removed drain at bedside; occlusive dressing placed; leave on for 48 hours - Monitor renal function; improving; likely volume depletion from surgery; making urine  - Monitor abdominal examination; on-going bowel function - Pain control prn; antiemetics prn   - OOB encouraged      - Discharge Planning; Reviewed potential for discharge today, but he states he can not get a ride home until tomorrow, anticipate DC in the AM, I will send prescriptions and place follow up today  All of the above findings and recommendations were discussed with the patient, and the medical team, and all of patient's questions were answered to his expressed satisfaction.  -- Edison Simon, PA-C [facility] 09/27/2021, 9:49 AM [phone] M-F: 7am - 4pm

## 2021-09-27 NOTE — Plan of Care (Signed)
  Problem: Clinical Measurements: Goal: Ability to maintain clinical measurements within normal limits will improve Outcome: Progressing Goal: Will remain free from infection Outcome: Progressing Goal: Diagnostic test results will improve Outcome: Progressing Goal: Respiratory complications will improve Outcome: Progressing Goal: Cardiovascular complication will be avoided Outcome: Progressing   Problem: Pain Managment: Goal: General experience of comfort will improve Outcome: Progressing   Pt is involved in and agrees with the plan of care. V/S stable. Complained of surgical pain on his abdomen; oxycodone given. Jp in place; drained serosanguinous output (24ml). Dressing changed.

## 2021-09-27 NOTE — Discharge Instructions (Addendum)
In addition to included general post-operative instructions,  Diet: Recommend following nissen diet x4 weeks; handouts given.   Activity: No heavy lifting >20 pounds (children, pets, laundry, garbage) or strenuous activity for 4 weeks, but light activity and walking are encouraged. Do not drive or drink alcohol if taking narcotic pain medications or having pain that might distract from driving.  Wound care: 2 days after surgery (10/01), you may shower/get incision wet with soapy water and pat dry (do not rub incisions), but no baths or submerging incision underwater until follow-up.   Medications: Resume all home medications. For mild to moderate pain: acetaminophen (Tylenol) or ibuprofen/naproxen (if no kidney disease). Combining Tylenol with alcohol can substantially increase your risk of causing liver disease. Narcotic pain medications, if prescribed, can be used for severe pain, though may cause nausea, constipation, and drowsiness. Do not combine Tylenol and Percocet (or similar) within a 6 hour period as Percocet (and similar) contain(s) Tylenol. If you do not need the narcotic pain medication, you do not need to fill the prescription.  Call office [phone] / [phone]) at any time if any questions, worsening pain, fevers/chills, bleeding, drainage from incision site, or other concerns.

## 2021-09-28 ENCOUNTER — Observation Stay: Payer: Medicare HMO

## 2021-09-28 DIAGNOSIS — E119 Type 2 diabetes mellitus without complications: Secondary | ICD-10-CM | POA: Diagnosis not present

## 2021-09-28 DIAGNOSIS — J449 Chronic obstructive pulmonary disease, unspecified: Secondary | ICD-10-CM | POA: Diagnosis not present

## 2021-09-28 DIAGNOSIS — J9811 Atelectasis: Secondary | ICD-10-CM | POA: Diagnosis not present

## 2021-09-28 DIAGNOSIS — Z87891 Personal history of nicotine dependence: Secondary | ICD-10-CM | POA: Diagnosis not present

## 2021-09-28 DIAGNOSIS — K449 Diaphragmatic hernia without obstruction or gangrene: Secondary | ICD-10-CM | POA: Diagnosis not present

## 2021-09-28 DIAGNOSIS — I10 Essential (primary) hypertension: Secondary | ICD-10-CM | POA: Diagnosis not present

## 2021-09-28 DIAGNOSIS — I517 Cardiomegaly: Secondary | ICD-10-CM | POA: Diagnosis not present

## 2021-09-28 LAB — GLUCOSE, CAPILLARY
Glucose-Capillary: 181 mg/dL — ABNORMAL HIGH (ref 70–99)
Glucose-Capillary: 177 mg/dL — ABNORMAL HIGH (ref 70–99)

## 2021-09-28 LAB — BASIC METABOLIC PANEL WITH GFR
Anion gap: 7 (ref 5–15)
BUN: 18 mg/dL (ref 8–23)
CO2: 26 mmol/L (ref 22–32)
Calcium: 8.7 mg/dL — ABNORMAL LOW (ref 8.9–10.3)
Chloride: 106 mmol/L (ref 98–111)
Creatinine, Ser: 1.33 mg/dL — ABNORMAL HIGH (ref 0.61–1.24)
GFR, Estimated: 56 mL/min — ABNORMAL LOW
Glucose, Bld: 161 mg/dL — ABNORMAL HIGH (ref 70–99)
Potassium: 4 mmol/L (ref 3.5–5.1)
Sodium: 139 mmol/L (ref 135–145)

## 2021-09-28 LAB — CBC
HCT: 31.8 % — ABNORMAL LOW (ref 39.0–52.0)
Hemoglobin: 10.3 g/dL — ABNORMAL LOW (ref 13.0–17.0)
MCH: 28.5 pg (ref 26.0–34.0)
MCHC: 32.4 g/dL (ref 30.0–36.0)
MCV: 88.1 fL (ref 80.0–100.0)
Platelets: 122 K/uL — ABNORMAL LOW (ref 150–400)
RBC: 3.61 MIL/uL — ABNORMAL LOW (ref 4.22–5.81)
RDW: 14.1 % (ref 11.5–15.5)
WBC: 12.5 K/uL — ABNORMAL HIGH (ref 4.0–10.5)
nRBC: 0 % (ref 0.0–0.2)

## 2021-09-28 MED ORDER — FUROSEMIDE 10 MG/ML IJ SOLN
20.0000 mg | Freq: Once | INTRAMUSCULAR | Status: AC
  Administered 2021-09-28: 20 mg via INTRAVENOUS
  Filled 2021-09-28: qty 4

## 2021-09-28 NOTE — Discharge Summary (Signed)
Patient ID: Chris Mercer MRN: [id number] DOB/AGE: February 03, 1946 75 y.o.  Admit date: 09/26/2021 Discharge date: 09/28/2021   Discharge Diagnoses:  Active Problems:   S/P repair of paraesophageal hernia   Procedures: Robotic paraesophageal hernia repair  Hospital Course:   Admitted after uneventful laparoscopic paraesophageal hernia repair with mesh.  Patient was kept 2 nights.  He does have a history of COPD and there was evidence of oxygen requirements.  On postoperative day #2 the was given Lasix with great response and with significant provement in respiratory status.  He does have chronic baseline COPD and wears oxygen at home.  .At  The time of discharge the patient was ambulating,  pain was controlled.  Her vital signs were stable and she was afebrile.   physical exam at discharge showed a pt  in no acute distress.  Awake and alert.  Abdomen: Soft incisions healing well without infection or peritonitis.  Extremities well-perfused and no edema.  Condition of the patient the time of discharge was stable    Disposition: Discharge disposition: 01-Home or Self Care       Discharge Instructions     Call MD for:  difficulty breathing, headache or visual disturbances   Complete by: As directed    Call MD for:  extreme fatigue   Complete by: As directed    Call MD for:  hives   Complete by: As directed    Call MD for:  persistant dizziness or light-headedness   Complete by: As directed    Call MD for:  persistant nausea and vomiting   Complete by: As directed    Call MD for:  persistant nausea and vomiting   Complete by: As directed    Call MD for:  redness, tenderness, or signs of infection (pain, swelling, redness, odor or green/yellow discharge around incision site)   Complete by: As directed    Call MD for:  redness, tenderness, or signs of infection (pain, swelling, redness, odor or green/yellow discharge around incision site)   Complete by: As directed    Call MD for:   severe uncontrolled pain   Complete by: As directed    Call MD for:  severe uncontrolled pain   Complete by: As directed    Call MD for:  temperature >100.4   Complete by: As directed    Call MD for:  temperature >100.4   Complete by: As directed    Diet - low sodium heart healthy   Complete by: As directed    Discharge instructions   Complete by: As directed    Follow up in 2 weeks Follow Nissen diet x4 weeks No lifting more than 15-20 lbs for 4 weeks  Okay to shower starting tomorrow (10/01) Do not drive if having pain or taking narcotics   Increase activity slowly   Complete by: As directed    Increase activity slowly   Complete by: As directed    Lifting restrictions   Complete by: As directed    No lifting more than 15-20 lbs for 4 weeks   Lifting restrictions   Complete by: As directed    20 lbs x 6 weeks   May discharge patient home 15 minutes after infusion if patient did not have any reaction.   Complete by: As directed       Allergies as of 09/28/2021       Reactions   Azithromycin Anaphylaxis, Swelling, Hives   Fenoprofen Anaphylaxis   Duloxetine Diarrhea   With dark stools  Medication List     STOP taking these medications    omeprazole 40 MG capsule Commonly known as: PRILOSEC       TAKE these medications    ALPRAZolam 0.25 MG tablet Commonly known as: XANAX Take 0.125-0.25 mg by mouth daily as needed for anxiety.   budesonide 0.25 MG/2ML nebulizer solution Commonly known as: PULMICORT Take 0.25 mg by nebulization daily as needed (COPD).   busPIRone 30 MG tablet Commonly known as: BUSPAR Take 30 mg by mouth 2 (two) times daily.   busPIRone 7.5 MG tablet Commonly known as: BUSPAR Take 1 tablet (7.5 mg total) by mouth 2 (two) times daily.   Cholecalciferol 25 MCG (1000 UT) tablet Take 2,000 mg by mouth daily.   diclofenac Sodium 1 % Gel Commonly known as: VOLTAREN Apply 2 g topically daily as needed (pain).   doxepin 150  MG capsule Commonly known as: SINEQUAN Take 150 mg by mouth at bedtime.   escitalopram 20 MG tablet Commonly known as: LEXAPRO Take 20 mg by mouth daily.   gabapentin 600 MG tablet Commonly known as: NEURONTIN Take 1,200 mg by mouth 2 (two) times daily.   glipiZIDE 5 MG 24 hr tablet Commonly known as: GLUCOTROL XL Take 5 mg by mouth daily with breakfast.   hydrOXYzine 25 MG tablet Commonly known as: ATARAX/VISTARIL Take 25 mg by mouth daily.   ipratropium-albuterol 0.5-2.5 (3) MG/3ML Soln Commonly known as: DUONEB Take 3 mLs by nebulization every 6 (six) hours as needed (COPD).   lisinopril 40 MG tablet Commonly known as: ZESTRIL Take 40 mg by mouth daily.   metFORMIN 500 MG tablet Commonly known as: GLUCOPHAGE Take 500 mg by mouth 2 (two) times daily with a meal.   naloxone 4 MG/0.1ML Liqd nasal spray kit Commonly known as: NARCAN Place 0.4 mg into the nose as needed (over dose).   oxyCODONE 5 MG immediate release tablet Commonly known as: Oxy IR/ROXICODONE Take 1 tablet (5 mg total) by mouth every 6 (six) hours as needed for severe pain or breakthrough pain. What changed: how much to take   pantoprazole 40 MG tablet Commonly known as: PROTONIX Take 40 mg by mouth daily. FOR 14 DAYS   rosuvastatin 40 MG tablet Commonly known as: CRESTOR Take 40 mg by mouth daily.   sucralfate 1 g tablet Commonly known as: CARAFATE Take 1 g by mouth 4 (four) times daily -  with meals and at bedtime.        Follow-up Information     Kimon Loewen, [place] F, MD. Schedule an appointment as soon as possible for a visit in 2 week(s).   Specialty: General Surgery Why: s/p paraesophageal hernia repair Contact information: [address] [place] [id number] [phone]                  Caroleen Hamman, MD FACS

## 2021-09-28 NOTE — Progress Notes (Signed)
Patients sats were around 84% on room air. He finally came up to 91% on 2 L. (he uses chronic o2 at home when he keeps it on). He voiced concern about going home and being able to take care of himself. I had him walk in the hall with a walker. His sats stayed around 87% on 2 liters with ambulation. He was unsteady and a little jerky when walking. Dr Dahlia Byes informed

## 2021-09-28 NOTE — Progress Notes (Signed)
Patient running sats of 80% on room air. Patient placed on prn oxygen at 2 liters and sats came up to 91%. Sharion Settler NP notified. Patient encouraged to use incentive spiromoter

## 2021-10-02 ENCOUNTER — Encounter: Payer: Self-pay | Admitting: Surgery

## 2021-10-14 DIAGNOSIS — Z79899 Other long term (current) drug therapy: Secondary | ICD-10-CM | POA: Diagnosis not present

## 2021-10-14 DIAGNOSIS — E669 Obesity, unspecified: Secondary | ICD-10-CM | POA: Diagnosis not present

## 2021-10-14 DIAGNOSIS — F32A Depression, unspecified: Secondary | ICD-10-CM | POA: Diagnosis not present

## 2021-10-14 DIAGNOSIS — M1711 Unilateral primary osteoarthritis, right knee: Secondary | ICD-10-CM | POA: Diagnosis not present

## 2021-10-14 DIAGNOSIS — G609 Hereditary and idiopathic neuropathy, unspecified: Secondary | ICD-10-CM | POA: Diagnosis not present

## 2021-10-15 DIAGNOSIS — J449 Chronic obstructive pulmonary disease, unspecified: Secondary | ICD-10-CM | POA: Diagnosis not present

## 2021-10-16 ENCOUNTER — Other Ambulatory Visit: Payer: Self-pay

## 2021-10-16 ENCOUNTER — Ambulatory Visit (INDEPENDENT_AMBULATORY_CARE_PROVIDER_SITE_OTHER): Payer: Medicare HMO | Admitting: Physician Assistant

## 2021-10-16 ENCOUNTER — Encounter: Payer: Self-pay | Admitting: Physician Assistant

## 2021-10-16 VITALS — BP 121/75 | HR 102 | Temp 98.0°F | Ht 72.0 in | Wt 228.4 lb

## 2021-10-16 DIAGNOSIS — K449 Diaphragmatic hernia without obstruction or gangrene: Secondary | ICD-10-CM

## 2021-10-16 DIAGNOSIS — Z09 Encounter for follow-up examination after completed treatment for conditions other than malignant neoplasm: Secondary | ICD-10-CM

## 2021-10-16 NOTE — Patient Instructions (Addendum)
Stay on diet for another week.  If you have any concerns or questions, please feel free to call our office. See follow up appointment below.    GENERAL POST-OPERATIVE PATIENT INSTRUCTIONS   WOUND CARE INSTRUCTIONS:  Keep a dry clean dressing on the wound if there is drainage. The initial bandage may be removed after 24 hours.  Once the wound has quit draining you may leave it open to air.  If clothing rubs against the wound or causes irritation and the wound is not draining you may cover it with a dry dressing during the daytime.  Try to keep the wound dry and avoid ointments on the wound unless directed to do so.  If the wound becomes bright red and painful or starts to drain infected material that is not clear, please contact your physician immediately.  If the wound is mildly pink and has a thick firm ridge underneath it, this is normal, and is referred to as a healing ridge.  This will resolve over the next 4-6 weeks.  BATHING: You may shower if you have been informed of this by your surgeon. However, Please do not submerge in a tub, hot tub, or pool until incisions are completely sealed or have been told by your surgeon that you may do so.  DIET:  You may eat any foods that you can tolerate.  It is a good idea to eat a high fiber diet and take in plenty of fluids to prevent constipation.  If you do become constipated you may want to take a mild laxative or take ducolax tablets on a daily basis until your bowel habits are regular.  Constipation can be very uncomfortable, along with straining, after recent surgery.  ACTIVITY:  You are encouraged to cough and deep breath or use your incentive spirometer if you were given one, every 15-30 minutes when awake.  This will help prevent respiratory complications and low grade fevers post-operatively if you had a general anesthetic.  You may want to hug a pillow when coughing and sneezing to add additional support to the surgical area, if you had abdominal  or chest surgery, which will decrease pain during these times.  You are encouraged to walk and engage in light activity for the next two weeks.  You should not lift more than 10-15 pounds, until 11/07/2021 as it could put you at increased risk for complications.  Twenty pounds is roughly equivalent to a plastic bag of groceries. At that time- Listen to your body when lifting, if you have pain when lifting, stop and then try again in a few days. Soreness after doing exercises or activities of daily living is normal as you get back in to your normal routine.  MEDICATIONS:  Try to take narcotic medications and anti-inflammatory medications, such as tylenol, ibuprofen, naprosyn, etc., with food.  This will minimize stomach upset from the medication.  Should you develop nausea and vomiting from the pain medication, or develop a rash, please discontinue the medication and contact your physician.  You should not drive, make important decisions, or operate machinery when taking narcotic pain medication.  SUNBLOCK Use sun block to incision area over the next year if this area will be exposed to sun. This helps decrease scarring and will allow you avoid a permanent darkened area over your incision.

## 2021-10-16 NOTE — Progress Notes (Signed)
[facility] POST-OP OFFICE VISIT  10/16/2021  HPI: Chris Mercer is a 75 y.o. male 20 days s/p robotic assisted paraesophageal hernia repair and Nissen fundoplication with Dr Dahlia Byes.   He is doing well He has some incisional soreness on his left lateral laparoscopic port but this is mild, 5/10 No fever, chills, nausea, emesis He is following the Nissed diet recommendations; anxious to eat regular food again Ambulating well No other issues   Vital signs: BP 121/75   Pulse (!) 102   Temp 98 F (36.7 C) (Oral)   Ht 6' (1.829 m)   Wt 228 lb 6.4 oz (103.6 kg)   SpO2 94%   BMI 30.98 kg/m    Physical Exam: Constitutional: Well appearing male, NAD Abdomen: Soft, non-tender, non-distended, no rebound/guarding Skin: His left lateral laparoscopic incision appears to have dehisced at the skin level only, no erythema or drainage. The remaining laparoscopic incisions are well healed.   Assessment/Plan: This is a 75 y.o. male 20 days s/p robotic assisted paraesophageal hernia repair and Nissen fundoplication    - Pain control prn; encouraged supplementing tylenol / Motrin  - Complete 4 weeks of Nissen diet; gradually resume regular diet  - Complete 6 weeks lifting restrictions  - Reviewed wound care  - I will see him again in 2-3 weeks for re-check   -- Edison Simon, PA-C [facility] 10/16/2021, 2:14 PM [phone] M-F: 7am - 4pm

## 2021-10-29 DIAGNOSIS — F330 Major depressive disorder, recurrent, mild: Secondary | ICD-10-CM | POA: Diagnosis not present

## 2021-10-29 DIAGNOSIS — F411 Generalized anxiety disorder: Secondary | ICD-10-CM | POA: Diagnosis not present

## 2021-11-04 ENCOUNTER — Other Ambulatory Visit: Payer: Self-pay

## 2021-11-04 ENCOUNTER — Encounter: Payer: Self-pay | Admitting: Physician Assistant

## 2021-11-04 ENCOUNTER — Ambulatory Visit (INDEPENDENT_AMBULATORY_CARE_PROVIDER_SITE_OTHER): Payer: Medicare HMO | Admitting: Physician Assistant

## 2021-11-04 VITALS — BP 130/85 | HR 75 | Temp 98.1°F | Ht 72.0 in | Wt 222.4 lb

## 2021-11-04 DIAGNOSIS — Z09 Encounter for follow-up examination after completed treatment for conditions other than malignant neoplasm: Secondary | ICD-10-CM

## 2021-11-04 DIAGNOSIS — K449 Diaphragmatic hernia without obstruction or gangrene: Secondary | ICD-10-CM

## 2021-11-04 NOTE — Patient Instructions (Signed)
You may resume your normal diet but take it slow.  If you have any concerns or questions, please feel free to call our office. Follow up as needed.  Orthostatic Hypotension Blood pressure is a measurement of how strongly, or weakly, your circulating blood is pressing against the walls of your arteries. Orthostatic hypotension is a drop in blood pressure that can happen when you change positions, such as when you go from lying down to standing. Arteries are blood vessels that carry blood from your heart throughout your body. When blood pressure is too low, you may not get enough blood to your brain or to the rest of your organs. Orthostatic hypotension can cause light-headedness, sweating, rapid heartbeat, blurred vision, and fainting. These symptoms require further investigation into the cause. What are the causes? Orthostatic hypotension can be caused by many things, including: Sudden changes in posture, such as standing up quickly after you have been sitting or lying down. Loss of blood (anemia) or loss of body fluids (dehydration). Heart problems, neurologic problems, or hormone problems. Pregnancy. Aging. The risk for this condition increases as you get older. Severe infection (sepsis). Certain medicines, such as medicines for high blood pressure or medicines that make the body lose excess fluids (diuretics). What are the signs or symptoms? Symptoms of this condition may include: Weakness, light-headedness, or dizziness. Sweating. Blurred vision. Tiredness (fatigue). Rapid heartbeat. Fainting, in severe cases. How is this diagnosed? This condition is diagnosed based on: Your symptoms and medical history. Your blood pressure measurements. Your health care provider will check your blood pressure when you are: Lying down. Sitting. Standing. A blood pressure reading is recorded as two numbers, such as "120 over 80" (or 120/80). The first ("top") number is called the systolic pressure.  It is a measure of the pressure in your arteries as your heart beats. The second ("bottom") number is called the diastolic pressure. It is a measure of the pressure in your arteries when your heart relaxes between beats. Blood pressure is measured in a unit called mmHg. Healthy blood pressure for most adults is 120/80 mmHg. Orthostatic hypotension is defined as a 20 mmHg drop in systolic pressure or a 10 mmHg drop in diastolic pressure within 3 minutes of standing. Other information or tests that may be used to diagnose orthostatic hypotension include: Your other vital signs, such as your heart rate and temperature. Blood tests. An electrocardiogram (ECG) or echocardiogram. A Holter monitor. This is a device you wear that records your heart rhythm continuously, usually for 24-48 hours. Tilt table test. For this test, you will be safely secured to a table that moves you from a lying position to an upright position. Your heart rhythm and blood pressure will be monitored during the test. How is this treated? This condition may be treated by: Changing your diet. This may involve eating more salt (sodium) or drinking more water. Changing the dosage of certain medicines you are taking that might be lowering your blood pressure. Correcting the underlying reason for the orthostatic hypotension. Wearing compression stockings. Taking medicines to raise your blood pressure. Avoiding actions that trigger symptoms. Follow these instructions at home: Medicines Take over-the-counter and prescription medicines only as told by your health care provider. Follow instructions from your health care provider about changing the dosage of your current medicines, if this applies. Do not stop or adjust any of your medicines on your own. Eating and drinking  Drink enough fluid to keep your urine pale yellow. Eat extra salt only  as directed. Do not add extra salt to your diet unless advised by your health care  provider. Eat frequent, small meals. Avoid standing up suddenly after eating. General instructions  Get up slowly from lying down or sitting positions. This gives your blood pressure a chance to adjust. Avoid hot showers and excessive heat as directed by your health care provider. Engage in regular physical activity as directed by your health care provider. If you have compression stockings, wear them as told. Keep all follow-up visits. This is important. Contact a health care provider if: You have a fever for more than 2-3 days. You feel more thirsty than usual. You feel dizzy or weak. Get help right away if: You have chest pain. You have a fast or irregular heartbeat. You become sweaty or feel light-headed. You feel short of breath. You faint. You have any symptoms of a stroke. "BE FAST" is an easy way to remember the main warning signs of a stroke: B - Balance. Signs are dizziness, sudden trouble walking, or loss of balance. E - Eyes. Signs are trouble seeing or a sudden change in vision. F - Face. Signs are sudden weakness or numbness of the face, or the face or eyelid drooping on one side. A - Arms. Signs are weakness or numbness in an arm. This happens suddenly and usually on one side of the body. S - Speech. Signs are sudden trouble speaking, slurred speech, or trouble understanding what people say. T - Time. Time to call emergency services. Write down what time symptoms started. You have other signs of a stroke, such as: A sudden, severe headache with no known cause. Nausea or vomiting. Seizure. These symptoms may represent a serious problem that is an emergency. Do not wait to see if the symptoms will go away. Get medical help right away. Call your local emergency services (911 in the [place] Do not drive yourself to the hospital. Summary Orthostatic hypotension is a sudden drop in blood pressure. It can cause light-headedness, sweating, rapid heartbeat, blurred vision, and  fainting. Orthostatic hypotension can be diagnosed by having your blood pressure taken while lying down, sitting, and then standing. Treatment may involve changing your diet, wearing compression stockings, sitting up slowly, adjusting your medicines, or correcting the underlying reason for the orthostatic hypotension. Get help right away if you have chest pain, a fast or irregular heartbeat, or symptoms of a stroke. This information is not intended to replace advice given to you by your health care provider. Make sure you discuss any questions you have with your health care provider. Document Revised: 02/28/2021 Document Reviewed: 02/28/2021 Elsevier Patient Education  [place].   Dehydration, Adult Dehydration is condition in which there is not enough water or other fluids in the body. This happens when a person loses more fluids than he or she takes in. Important body parts cannot work right without the right amount of fluids. Any loss of fluids from the body can cause dehydration. Dehydration can be mild, worse, or very bad. It should be treated right away to keep it from getting very bad. What are the causes? This condition may be caused by: Conditions that cause loss of water or other fluids, such as: Watery poop (diarrhea). Vomiting. Sweating a lot. Peeing (urinating) a lot. Not drinking enough fluids, especially when you: Are ill. Are doing things that take a lot of energy to do. Other illnesses and conditions, such as fever or infection. Certain medicines, such as medicines that take  extra fluid out of the body (diuretics). Lack of safe drinking water. Not being able to get enough water and food. What increases the risk? The following factors may make you more likely to develop this condition: Having a long-term (chronic) illness that has not been treated the right way, such as: Diabetes. Heart disease. Kidney disease. Being 75 years of age or older. Having a  disability. Living in a place that is high above the ground or sea (high in altitude). The thinner, dried air causes more fluid loss. Doing exercises that put stress on your body for a long time. What are the signs or symptoms? Symptoms of dehydration depend on how bad it is. Mild or worse dehydration Thirst. Dry lips or dry mouth. Feeling dizzy or light-headed, especially when you stand up from sitting. Muscle cramps. Your body making: Dark pee (urine). Pee may be the color of tea. Less pee than normal. Less tears than normal. Headache. Very bad dehydration Changes in skin. Skin may: Be cold to the touch (clammy). Be blotchy or pale. Not go back to normal right after you lightly pinch it and let it go. Little or no tears, pee, or sweat. Changes in vital signs, such as: Fast breathing. Low blood pressure. Weak pulse. Pulse that is more than 100 beats a minute when you are sitting still. Other changes, such as: Feeling very thirsty. Eyes that look hollow (sunken). Cold hands and feet. Being mixed up (confused). Being very tired (lethargic) or having trouble waking from sleep. Short-term weight loss. Loss of consciousness. How is this treated? Treatment for this condition depends on how bad it is. Treatment should start right away. Do not wait until your condition gets very bad. Very bad dehydration is an emergency. You will need to go to a hospital. Mild or worse dehydration can be treated at home. You may be asked to: Drink more fluids. Drink an oral rehydration solution (ORS). This drink helps get the right amounts of fluids and salts and minerals in the blood (electrolytes). Very bad dehydration can be treated: With fluids through an IV tube. By getting normal levels of salts and minerals in your blood. This is often done by giving salts and minerals through a tube. The tube is passed through your nose and into your stomach. By treating the root cause. Follow these  instructions at home: Oral rehydration solution If told by your doctor, drink an ORS: Make an ORS. Use instructions on the package. Start by drinking small amounts, about  cup (120 mL) every 5-10 minutes. Slowly drink more until you have had the amount that your doctor said to have. Eating and drinking     Drink enough clear fluid to keep your pee pale yellow. If you were told to drink an ORS, finish the ORS first. Then, start slowly drinking other clear fluids. Drink fluids such as: Water. Do not drink only water. Doing that can make the salt (sodium) level in your body get too low. Water from ice chips you suck on. Fruit juice that you have added water to (diluted). Low-calorie sports drinks. Eat foods that have the right amounts of salts and minerals, such as: Bananas. Oranges. Potatoes. Tomatoes. Spinach. Do not drink alcohol. Avoid: Drinks that have a lot of sugar. These include: High-calorie sports drinks. Fruit juice that you did not add water to. Soda. Caffeine. Foods that are greasy or have a lot of fat or sugar. General instructions Take over-the-counter and prescription medicines only as told by  your doctor. Do not take salt tablets. Doing that can make the salt level in your body get too high. Return to your normal activities as told by your doctor. Ask your doctor what activities are safe for you. Keep all follow-up visits as told by your doctor. This is important. Contact a doctor if: You have pain in your belly (abdomen) and the pain: Gets worse. Stays in one place. You have a rash. You have a stiff neck. You get angry or annoyed (irritable) more easily than normal. You are more tired or have a harder time waking than normal. You feel: Weak or dizzy. Very thirsty. Get help right away if you have: Any symptoms of very bad dehydration. Symptoms of vomiting, such as: You cannot eat or drink without vomiting. Your vomiting gets worse or does not go  away. Your vomit has blood or green stuff in it. Symptoms that get worse with treatment. A fever. A very bad headache. Problems with peeing or pooping (having a bowel movement), such as: Watery poop that gets worse or does not go away. Blood in your poop (stool). This may cause poop to look black and tarry. Not peeing in 6-8 hours. Peeing only a small amount of very dark pee in 6-8 hours. Trouble breathing. These symptoms may be an emergency. Do not wait to see if the symptoms will go away. Get medical help right away. Call your local emergency services (911 in the [place] Do not drive yourself to the hospital. Summary Dehydration is a condition in which there is not enough water or other fluids in the body. This happens when a person loses more fluids than he or she takes in. Treatment for this condition depends on how bad it is. Treatment should be started right away. Do not wait until your condition gets very bad. Drink enough clear fluid to keep your pee pale yellow. If you were told to drink an oral rehydration solution (ORS), finish the ORS first. Then, start slowly drinking other clear fluids. Take over-the-counter and prescription medicines only as told by your doctor. Get help right away if you have any symptoms of very bad dehydration. This information is not intended to replace advice given to you by your health care provider. Make sure you discuss any questions you have with your health care provider. Document Revised: 07/28/2019 Document Reviewed: 07/28/2019 Elsevier Patient Education  [place].

## 2021-11-04 NOTE — Progress Notes (Signed)
[facility] POST-OP OFFICE VISIT  11/04/2021  HPI: Chris Mercer is a 75 y.o. male ~1.5 months s/p robotic assisted paraesophageal hernia repair and Nissen fundoplication with Dr Dahlia Byes.   He has done well at home since our last visit He does continue to notice sporadic episodes of dizziness almost 100% associated with going from sitting to standing quickly. He typically sits down and these resolve without intervention.  No abdominal pain, fever, chills, nausea, emesis, or bowel changes No issues with incisions  No other complaints  Vital signs: BP 130/85   Pulse 75   Temp 98.1 F (36.7 C) (Oral)   Ht 6' (1.829 m)   Wt 222 lb 6.4 oz (100.9 kg)   SpO2 95%   BMI 30.16 kg/m    Physical Exam: Constitutional: Well appearing male, NAD Abdomen: Soft, non-tender, non-distended, no rebound/guarding Skin: Laparoscopic incisions are healing well, no erythema or drainage  Assessment/Plan: This is a 75 y.o. male ~1.5 months s/p robotic assisted paraesophageal hernia repair and Nissen fundoplication   - It does sound like he is having episodes of orthostatic hypotension; encouraged him to maintain adequate hydration; monitor BP (normotensive here); may need to be addressed with PCP   - Pain control as needed  - He has completed his 4 weeks of Nissen diet recommendations  - He has completed his 6 weeks of lifting restrictions   - At this point, he can follow up with [place] as needed. He understands to call with questions/concerns.   -- Edison Simon, PA-C  [facility] 11/04/2021, 1:38 PM [phone] M-F: 7am - 4pm

## 2021-11-15 DIAGNOSIS — J449 Chronic obstructive pulmonary disease, unspecified: Secondary | ICD-10-CM | POA: Diagnosis not present

## 2021-11-16 DIAGNOSIS — G609 Hereditary and idiopathic neuropathy, unspecified: Secondary | ICD-10-CM | POA: Diagnosis not present

## 2021-11-16 DIAGNOSIS — Z79899 Other long term (current) drug therapy: Secondary | ICD-10-CM | POA: Diagnosis not present

## 2021-11-16 DIAGNOSIS — M1711 Unilateral primary osteoarthritis, right knee: Secondary | ICD-10-CM | POA: Diagnosis not present

## 2021-11-16 DIAGNOSIS — E669 Obesity, unspecified: Secondary | ICD-10-CM | POA: Diagnosis not present

## 2021-11-16 DIAGNOSIS — F32A Depression, unspecified: Secondary | ICD-10-CM | POA: Diagnosis not present

## 2021-11-26 DIAGNOSIS — E538 Deficiency of other specified B group vitamins: Secondary | ICD-10-CM | POA: Diagnosis not present

## 2021-11-26 DIAGNOSIS — I10 Essential (primary) hypertension: Secondary | ICD-10-CM | POA: Diagnosis not present

## 2021-11-26 DIAGNOSIS — E1122 Type 2 diabetes mellitus with diabetic chronic kidney disease: Secondary | ICD-10-CM | POA: Diagnosis not present

## 2021-11-26 DIAGNOSIS — E785 Hyperlipidemia, unspecified: Secondary | ICD-10-CM | POA: Diagnosis not present

## 2021-11-28 DIAGNOSIS — E1142 Type 2 diabetes mellitus with diabetic polyneuropathy: Secondary | ICD-10-CM | POA: Diagnosis not present

## 2021-11-28 DIAGNOSIS — E6609 Other obesity due to excess calories: Secondary | ICD-10-CM | POA: Diagnosis not present

## 2021-11-28 DIAGNOSIS — M25561 Pain in right knee: Secondary | ICD-10-CM | POA: Diagnosis not present

## 2021-11-28 DIAGNOSIS — E785 Hyperlipidemia, unspecified: Secondary | ICD-10-CM | POA: Diagnosis not present

## 2021-11-28 DIAGNOSIS — F410 Panic disorder [episodic paroxysmal anxiety] without agoraphobia: Secondary | ICD-10-CM | POA: Diagnosis not present

## 2021-11-28 DIAGNOSIS — J029 Acute pharyngitis, unspecified: Secondary | ICD-10-CM | POA: Diagnosis not present

## 2021-11-28 DIAGNOSIS — I10 Essential (primary) hypertension: Secondary | ICD-10-CM | POA: Diagnosis not present

## 2021-11-28 DIAGNOSIS — J069 Acute upper respiratory infection, unspecified: Secondary | ICD-10-CM | POA: Diagnosis not present

## 2021-11-28 DIAGNOSIS — E1122 Type 2 diabetes mellitus with diabetic chronic kidney disease: Secondary | ICD-10-CM | POA: Diagnosis not present

## 2021-12-15 DIAGNOSIS — J449 Chronic obstructive pulmonary disease, unspecified: Secondary | ICD-10-CM | POA: Diagnosis not present

## 2021-12-18 DIAGNOSIS — M1711 Unilateral primary osteoarthritis, right knee: Secondary | ICD-10-CM | POA: Diagnosis not present

## 2021-12-18 DIAGNOSIS — Z79899 Other long term (current) drug therapy: Secondary | ICD-10-CM | POA: Diagnosis not present

## 2021-12-18 DIAGNOSIS — G609 Hereditary and idiopathic neuropathy, unspecified: Secondary | ICD-10-CM | POA: Diagnosis not present

## 2021-12-18 DIAGNOSIS — E669 Obesity, unspecified: Secondary | ICD-10-CM | POA: Diagnosis not present

## 2021-12-18 DIAGNOSIS — F32A Depression, unspecified: Secondary | ICD-10-CM | POA: Diagnosis not present

## 2022-01-13 DIAGNOSIS — E119 Type 2 diabetes mellitus without complications: Secondary | ICD-10-CM | POA: Diagnosis not present

## 2022-01-13 DIAGNOSIS — R413 Other amnesia: Secondary | ICD-10-CM | POA: Diagnosis not present

## 2022-01-13 DIAGNOSIS — Z113 Encounter for screening for infections with a predominantly sexual mode of transmission: Secondary | ICD-10-CM | POA: Diagnosis not present

## 2022-01-13 DIAGNOSIS — Z23 Encounter for immunization: Secondary | ICD-10-CM | POA: Diagnosis not present

## 2022-01-13 DIAGNOSIS — F32A Depression, unspecified: Secondary | ICD-10-CM | POA: Diagnosis not present

## 2022-01-13 DIAGNOSIS — F1120 Opioid dependence, uncomplicated: Secondary | ICD-10-CM | POA: Diagnosis not present

## 2022-01-13 DIAGNOSIS — F419 Anxiety disorder, unspecified: Secondary | ICD-10-CM | POA: Diagnosis not present

## 2022-01-13 DIAGNOSIS — E1169 Type 2 diabetes mellitus with other specified complication: Secondary | ICD-10-CM | POA: Diagnosis not present

## 2022-01-13 DIAGNOSIS — I10 Essential (primary) hypertension: Secondary | ICD-10-CM | POA: Diagnosis not present

## 2022-01-13 DIAGNOSIS — E782 Mixed hyperlipidemia: Secondary | ICD-10-CM | POA: Diagnosis not present

## 2022-01-15 DIAGNOSIS — J449 Chronic obstructive pulmonary disease, unspecified: Secondary | ICD-10-CM | POA: Diagnosis not present

## 2022-01-20 DIAGNOSIS — M1711 Unilateral primary osteoarthritis, right knee: Secondary | ICD-10-CM | POA: Diagnosis not present

## 2022-01-20 DIAGNOSIS — G609 Hereditary and idiopathic neuropathy, unspecified: Secondary | ICD-10-CM | POA: Diagnosis not present

## 2022-01-20 DIAGNOSIS — F1120 Opioid dependence, uncomplicated: Secondary | ICD-10-CM | POA: Diagnosis not present

## 2022-01-20 DIAGNOSIS — E669 Obesity, unspecified: Secondary | ICD-10-CM | POA: Diagnosis not present

## 2022-01-20 DIAGNOSIS — F32A Depression, unspecified: Secondary | ICD-10-CM | POA: Diagnosis not present

## 2022-02-03 DIAGNOSIS — E11621 Type 2 diabetes mellitus with foot ulcer: Secondary | ICD-10-CM | POA: Diagnosis not present

## 2022-02-03 DIAGNOSIS — L03031 Cellulitis of right toe: Secondary | ICD-10-CM | POA: Diagnosis not present

## 2022-02-03 DIAGNOSIS — L89893 Pressure ulcer of other site, stage 3: Secondary | ICD-10-CM | POA: Diagnosis not present

## 2022-02-15 DIAGNOSIS — J449 Chronic obstructive pulmonary disease, unspecified: Secondary | ICD-10-CM | POA: Diagnosis not present

## 2022-02-17 DIAGNOSIS — L89893 Pressure ulcer of other site, stage 3: Secondary | ICD-10-CM | POA: Diagnosis not present

## 2022-02-17 DIAGNOSIS — E11621 Type 2 diabetes mellitus with foot ulcer: Secondary | ICD-10-CM | POA: Diagnosis not present

## 2022-02-20 DIAGNOSIS — M1711 Unilateral primary osteoarthritis, right knee: Secondary | ICD-10-CM | POA: Diagnosis not present

## 2022-02-20 DIAGNOSIS — F1120 Opioid dependence, uncomplicated: Secondary | ICD-10-CM | POA: Diagnosis not present

## 2022-02-20 DIAGNOSIS — E669 Obesity, unspecified: Secondary | ICD-10-CM | POA: Diagnosis not present

## 2022-02-20 DIAGNOSIS — F32A Depression, unspecified: Secondary | ICD-10-CM | POA: Diagnosis not present

## 2022-02-20 DIAGNOSIS — G609 Hereditary and idiopathic neuropathy, unspecified: Secondary | ICD-10-CM | POA: Diagnosis not present

## 2022-02-24 DIAGNOSIS — Z79899 Other long term (current) drug therapy: Secondary | ICD-10-CM | POA: Diagnosis not present

## 2022-02-27 DIAGNOSIS — I10 Essential (primary) hypertension: Secondary | ICD-10-CM | POA: Diagnosis not present

## 2022-02-27 DIAGNOSIS — E1122 Type 2 diabetes mellitus with diabetic chronic kidney disease: Secondary | ICD-10-CM | POA: Diagnosis not present

## 2022-02-27 DIAGNOSIS — E785 Hyperlipidemia, unspecified: Secondary | ICD-10-CM | POA: Diagnosis not present

## 2022-03-04 DIAGNOSIS — G8929 Other chronic pain: Secondary | ICD-10-CM | POA: Diagnosis not present

## 2022-03-04 DIAGNOSIS — I10 Essential (primary) hypertension: Secondary | ICD-10-CM | POA: Diagnosis not present

## 2022-03-04 DIAGNOSIS — F411 Generalized anxiety disorder: Secondary | ICD-10-CM | POA: Diagnosis not present

## 2022-03-04 DIAGNOSIS — E785 Hyperlipidemia, unspecified: Secondary | ICD-10-CM | POA: Diagnosis not present

## 2022-03-04 DIAGNOSIS — E1142 Type 2 diabetes mellitus with diabetic polyneuropathy: Secondary | ICD-10-CM | POA: Diagnosis not present

## 2022-03-04 DIAGNOSIS — E1165 Type 2 diabetes mellitus with hyperglycemia: Secondary | ICD-10-CM | POA: Diagnosis not present

## 2022-03-15 DIAGNOSIS — J449 Chronic obstructive pulmonary disease, unspecified: Secondary | ICD-10-CM | POA: Diagnosis not present

## 2022-03-25 DIAGNOSIS — M1711 Unilateral primary osteoarthritis, right knee: Secondary | ICD-10-CM | POA: Diagnosis not present

## 2022-03-25 DIAGNOSIS — F1120 Opioid dependence, uncomplicated: Secondary | ICD-10-CM | POA: Diagnosis not present

## 2022-03-25 DIAGNOSIS — E669 Obesity, unspecified: Secondary | ICD-10-CM | POA: Diagnosis not present

## 2022-03-25 DIAGNOSIS — F32A Depression, unspecified: Secondary | ICD-10-CM | POA: Diagnosis not present

## 2022-03-25 DIAGNOSIS — G609 Hereditary and idiopathic neuropathy, unspecified: Secondary | ICD-10-CM | POA: Diagnosis not present

## 2022-03-28 DIAGNOSIS — E785 Hyperlipidemia, unspecified: Secondary | ICD-10-CM | POA: Diagnosis not present

## 2022-03-28 DIAGNOSIS — I10 Essential (primary) hypertension: Secondary | ICD-10-CM | POA: Diagnosis not present

## 2022-03-28 DIAGNOSIS — J449 Chronic obstructive pulmonary disease, unspecified: Secondary | ICD-10-CM | POA: Diagnosis not present

## 2022-04-15 DIAGNOSIS — J449 Chronic obstructive pulmonary disease, unspecified: Secondary | ICD-10-CM | POA: Diagnosis not present

## 2022-04-28 DIAGNOSIS — Z5181 Encounter for therapeutic drug level monitoring: Secondary | ICD-10-CM | POA: Diagnosis not present

## 2022-04-28 DIAGNOSIS — M1711 Unilateral primary osteoarthritis, right knee: Secondary | ICD-10-CM | POA: Diagnosis not present

## 2022-04-28 DIAGNOSIS — M25461 Effusion, right knee: Secondary | ICD-10-CM | POA: Diagnosis not present

## 2022-04-28 DIAGNOSIS — Z7952 Long term (current) use of systemic steroids: Secondary | ICD-10-CM | POA: Diagnosis not present

## 2022-04-29 DIAGNOSIS — F32A Depression, unspecified: Secondary | ICD-10-CM | POA: Diagnosis not present

## 2022-04-29 DIAGNOSIS — G609 Hereditary and idiopathic neuropathy, unspecified: Secondary | ICD-10-CM | POA: Diagnosis not present

## 2022-04-29 DIAGNOSIS — F1120 Opioid dependence, uncomplicated: Secondary | ICD-10-CM | POA: Diagnosis not present

## 2022-04-29 DIAGNOSIS — E669 Obesity, unspecified: Secondary | ICD-10-CM | POA: Diagnosis not present

## 2022-04-29 DIAGNOSIS — M1711 Unilateral primary osteoarthritis, right knee: Secondary | ICD-10-CM | POA: Diagnosis not present

## 2022-05-15 DIAGNOSIS — J449 Chronic obstructive pulmonary disease, unspecified: Secondary | ICD-10-CM | POA: Diagnosis not present

## 2022-05-16 DIAGNOSIS — M1711 Unilateral primary osteoarthritis, right knee: Secondary | ICD-10-CM | POA: Diagnosis not present

## 2022-05-20 DIAGNOSIS — K219 Gastro-esophageal reflux disease without esophagitis: Secondary | ICD-10-CM | POA: Diagnosis not present

## 2022-05-20 DIAGNOSIS — K5900 Constipation, unspecified: Secondary | ICD-10-CM | POA: Diagnosis not present

## 2022-05-28 DIAGNOSIS — J449 Chronic obstructive pulmonary disease, unspecified: Secondary | ICD-10-CM | POA: Diagnosis not present

## 2022-05-28 DIAGNOSIS — E785 Hyperlipidemia, unspecified: Secondary | ICD-10-CM | POA: Diagnosis not present

## 2022-05-28 DIAGNOSIS — I10 Essential (primary) hypertension: Secondary | ICD-10-CM | POA: Diagnosis not present

## 2022-06-05 DIAGNOSIS — F331 Major depressive disorder, recurrent, moderate: Secondary | ICD-10-CM | POA: Diagnosis not present

## 2022-06-05 DIAGNOSIS — F411 Generalized anxiety disorder: Secondary | ICD-10-CM | POA: Diagnosis not present

## 2022-06-07 DIAGNOSIS — E669 Obesity, unspecified: Secondary | ICD-10-CM | POA: Diagnosis not present

## 2022-06-07 DIAGNOSIS — F1120 Opioid dependence, uncomplicated: Secondary | ICD-10-CM | POA: Diagnosis not present

## 2022-06-07 DIAGNOSIS — M1711 Unilateral primary osteoarthritis, right knee: Secondary | ICD-10-CM | POA: Diagnosis not present

## 2022-06-07 DIAGNOSIS — F32A Depression, unspecified: Secondary | ICD-10-CM | POA: Diagnosis not present

## 2022-06-07 DIAGNOSIS — G609 Hereditary and idiopathic neuropathy, unspecified: Secondary | ICD-10-CM | POA: Diagnosis not present

## 2022-06-09 DIAGNOSIS — M1711 Unilateral primary osteoarthritis, right knee: Secondary | ICD-10-CM | POA: Diagnosis not present

## 2022-06-11 DIAGNOSIS — M25561 Pain in right knee: Secondary | ICD-10-CM | POA: Diagnosis not present

## 2022-06-11 DIAGNOSIS — M222X1 Patellofemoral disorders, right knee: Secondary | ICD-10-CM | POA: Diagnosis not present

## 2022-06-11 DIAGNOSIS — G8929 Other chronic pain: Secondary | ICD-10-CM | POA: Diagnosis not present

## 2022-06-11 DIAGNOSIS — M1711 Unilateral primary osteoarthritis, right knee: Secondary | ICD-10-CM | POA: Diagnosis not present

## 2022-06-18 DIAGNOSIS — M1711 Unilateral primary osteoarthritis, right knee: Secondary | ICD-10-CM | POA: Diagnosis not present

## 2022-06-18 DIAGNOSIS — F1120 Opioid dependence, uncomplicated: Secondary | ICD-10-CM | POA: Diagnosis not present

## 2022-06-18 DIAGNOSIS — F32A Depression, unspecified: Secondary | ICD-10-CM | POA: Diagnosis not present

## 2022-06-18 DIAGNOSIS — E119 Type 2 diabetes mellitus without complications: Secondary | ICD-10-CM | POA: Diagnosis not present

## 2022-06-18 DIAGNOSIS — G609 Hereditary and idiopathic neuropathy, unspecified: Secondary | ICD-10-CM | POA: Diagnosis not present

## 2022-06-19 DIAGNOSIS — E1142 Type 2 diabetes mellitus with diabetic polyneuropathy: Secondary | ICD-10-CM | POA: Diagnosis not present

## 2022-06-19 DIAGNOSIS — I10 Essential (primary) hypertension: Secondary | ICD-10-CM | POA: Diagnosis not present

## 2022-06-19 DIAGNOSIS — E785 Hyperlipidemia, unspecified: Secondary | ICD-10-CM | POA: Diagnosis not present

## 2022-06-23 DIAGNOSIS — J449 Chronic obstructive pulmonary disease, unspecified: Secondary | ICD-10-CM | POA: Diagnosis not present

## 2022-06-23 DIAGNOSIS — E785 Hyperlipidemia, unspecified: Secondary | ICD-10-CM | POA: Diagnosis not present

## 2022-06-23 DIAGNOSIS — E6609 Other obesity due to excess calories: Secondary | ICD-10-CM | POA: Diagnosis not present

## 2022-06-23 DIAGNOSIS — E1122 Type 2 diabetes mellitus with diabetic chronic kidney disease: Secondary | ICD-10-CM | POA: Diagnosis not present

## 2022-06-23 DIAGNOSIS — F411 Generalized anxiety disorder: Secondary | ICD-10-CM | POA: Diagnosis not present

## 2022-06-23 DIAGNOSIS — E1165 Type 2 diabetes mellitus with hyperglycemia: Secondary | ICD-10-CM | POA: Diagnosis not present

## 2022-06-23 DIAGNOSIS — I10 Essential (primary) hypertension: Secondary | ICD-10-CM | POA: Diagnosis not present

## 2022-07-17 DIAGNOSIS — M1711 Unilateral primary osteoarthritis, right knee: Secondary | ICD-10-CM | POA: Diagnosis not present

## 2022-07-31 DIAGNOSIS — G609 Hereditary and idiopathic neuropathy, unspecified: Secondary | ICD-10-CM | POA: Diagnosis not present

## 2022-07-31 DIAGNOSIS — F1120 Opioid dependence, uncomplicated: Secondary | ICD-10-CM | POA: Diagnosis not present

## 2022-07-31 DIAGNOSIS — E669 Obesity, unspecified: Secondary | ICD-10-CM | POA: Diagnosis not present

## 2022-07-31 DIAGNOSIS — M1711 Unilateral primary osteoarthritis, right knee: Secondary | ICD-10-CM | POA: Diagnosis not present

## 2022-07-31 DIAGNOSIS — F32A Depression, unspecified: Secondary | ICD-10-CM | POA: Diagnosis not present

## 2022-07-31 DIAGNOSIS — E119 Type 2 diabetes mellitus without complications: Secondary | ICD-10-CM | POA: Diagnosis not present

## 2022-07-31 DIAGNOSIS — N189 Chronic kidney disease, unspecified: Secondary | ICD-10-CM | POA: Diagnosis not present

## 2022-08-12 DIAGNOSIS — N1831 Chronic kidney disease, stage 3a: Secondary | ICD-10-CM | POA: Diagnosis not present

## 2022-08-12 DIAGNOSIS — R600 Localized edema: Secondary | ICD-10-CM | POA: Diagnosis not present

## 2022-08-12 DIAGNOSIS — I129 Hypertensive chronic kidney disease with stage 1 through stage 4 chronic kidney disease, or unspecified chronic kidney disease: Secondary | ICD-10-CM | POA: Diagnosis not present

## 2022-08-19 DIAGNOSIS — K219 Gastro-esophageal reflux disease without esophagitis: Secondary | ICD-10-CM | POA: Diagnosis not present

## 2022-08-26 DIAGNOSIS — F330 Major depressive disorder, recurrent, mild: Secondary | ICD-10-CM | POA: Diagnosis not present

## 2022-08-26 DIAGNOSIS — F411 Generalized anxiety disorder: Secondary | ICD-10-CM | POA: Diagnosis not present

## 2022-08-28 DIAGNOSIS — M1711 Unilateral primary osteoarthritis, right knee: Secondary | ICD-10-CM | POA: Diagnosis not present

## 2022-08-28 DIAGNOSIS — G8929 Other chronic pain: Secondary | ICD-10-CM | POA: Diagnosis not present

## 2022-08-28 DIAGNOSIS — E785 Hyperlipidemia, unspecified: Secondary | ICD-10-CM | POA: Diagnosis not present

## 2022-08-28 DIAGNOSIS — N189 Chronic kidney disease, unspecified: Secondary | ICD-10-CM | POA: Diagnosis not present

## 2022-08-28 DIAGNOSIS — E119 Type 2 diabetes mellitus without complications: Secondary | ICD-10-CM | POA: Diagnosis not present

## 2022-08-28 DIAGNOSIS — E669 Obesity, unspecified: Secondary | ICD-10-CM | POA: Diagnosis not present

## 2022-08-28 DIAGNOSIS — F1120 Opioid dependence, uncomplicated: Secondary | ICD-10-CM | POA: Diagnosis not present

## 2022-08-28 DIAGNOSIS — G609 Hereditary and idiopathic neuropathy, unspecified: Secondary | ICD-10-CM | POA: Diagnosis not present

## 2022-08-28 DIAGNOSIS — I10 Essential (primary) hypertension: Secondary | ICD-10-CM | POA: Diagnosis not present

## 2022-08-28 DIAGNOSIS — F32A Depression, unspecified: Secondary | ICD-10-CM | POA: Diagnosis not present

## 2022-08-28 DIAGNOSIS — J449 Chronic obstructive pulmonary disease, unspecified: Secondary | ICD-10-CM | POA: Diagnosis not present

## 2022-09-03 DIAGNOSIS — G8929 Other chronic pain: Secondary | ICD-10-CM | POA: Diagnosis not present

## 2022-09-03 DIAGNOSIS — M1711 Unilateral primary osteoarthritis, right knee: Secondary | ICD-10-CM | POA: Diagnosis not present

## 2022-09-03 DIAGNOSIS — M7918 Myalgia, other site: Secondary | ICD-10-CM | POA: Diagnosis not present

## 2022-09-03 DIAGNOSIS — M25561 Pain in right knee: Secondary | ICD-10-CM | POA: Diagnosis not present

## 2022-09-04 ENCOUNTER — Telehealth: Payer: Self-pay

## 2022-09-04 NOTE — Patient Outreach (Signed)
  Care Coordination   09/04/2022 Name: Chris Mercer MRN: [id number] DOB: Jun 06, 1946   Care Coordination Outreach Attempts:  An unsuccessful telephone outreach was attempted today to offer the patient information about available care coordination services as a benefit of their health plan.   Follow Up Plan:  Additional outreach attempts will be made to offer the patient care coordination information and services.   Encounter Outcome:  No Answer  Care Coordination Interventions Activated:  No   Care Coordination Interventions:  No, not indicated    Enzo Montgomery, RN,BSN,CCM [facility] Care Management Telephonic Care Management Coordinator Direct Phone: [phone] Toll Free: [phone] Fax: [phone]'

## 2022-09-09 ENCOUNTER — Telehealth: Payer: Self-pay

## 2022-09-09 NOTE — Patient Outreach (Signed)
  Care Coordination   Initial Visit Note   09/09/2022 Name: Trevonte Ashkar MRN: [id number] DOB: 27-Jun-1946  Izaya Netherton is a 76 y.o. year old male who sees [facility] for [facility]. I spoke with  Rae Mar by phone today.  What matters to the patients health and wellness today?  Patient states things going well. He is pleased that he lost over 20 lbs which has helped his health especially Diabetes. A1C level now down to 6.6. Denies any RN CM needs or concerns at this time.    Goals Addressed             This Visit's Progress    COMPLETED: Care Coordination-no follow up required       Care Coordination Interventions: Provided education to patient re: health maintenance measures-Annual Wellness Visit, flu,PNA and COVID vaccines Assessed social determinant of health barriers Discussed [facility] services          SDOH assessments and interventions completed:  Yes  SDOH Interventions Today    Flowsheet Row Most Recent Value  SDOH Interventions   Food Insecurity Interventions Intervention Not Indicated  Transportation Interventions Intervention Not Indicated        Care Coordination Interventions Activated:  Yes  Care Coordination Interventions:  Yes, provided   Follow up plan: No further intervention required.   Encounter Outcome:  Pt. Visit Completed    Enzo Montgomery, RN,BSN,CCM [place] Management Telephonic Care Management Coordinator Direct Phone: [phone] Toll Free: [phone] Fax: [phone]

## 2022-09-23 DIAGNOSIS — F330 Major depressive disorder, recurrent, mild: Secondary | ICD-10-CM | POA: Diagnosis not present

## 2022-09-23 DIAGNOSIS — F411 Generalized anxiety disorder: Secondary | ICD-10-CM | POA: Diagnosis not present

## 2022-09-25 DIAGNOSIS — H5213 Myopia, bilateral: Secondary | ICD-10-CM | POA: Diagnosis not present

## 2022-09-29 DIAGNOSIS — F32A Depression, unspecified: Secondary | ICD-10-CM | POA: Diagnosis not present

## 2022-09-29 DIAGNOSIS — E669 Obesity, unspecified: Secondary | ICD-10-CM | POA: Diagnosis not present

## 2022-09-29 DIAGNOSIS — G894 Chronic pain syndrome: Secondary | ICD-10-CM | POA: Diagnosis not present

## 2022-09-29 DIAGNOSIS — E119 Type 2 diabetes mellitus without complications: Secondary | ICD-10-CM | POA: Diagnosis not present

## 2022-09-29 DIAGNOSIS — M1711 Unilateral primary osteoarthritis, right knee: Secondary | ICD-10-CM | POA: Diagnosis not present

## 2022-09-29 DIAGNOSIS — N189 Chronic kidney disease, unspecified: Secondary | ICD-10-CM | POA: Diagnosis not present

## 2022-09-29 DIAGNOSIS — G609 Hereditary and idiopathic neuropathy, unspecified: Secondary | ICD-10-CM | POA: Diagnosis not present

## 2022-10-09 DIAGNOSIS — H524 Presbyopia: Secondary | ICD-10-CM | POA: Diagnosis not present

## 2022-10-14 DIAGNOSIS — I10 Essential (primary) hypertension: Secondary | ICD-10-CM | POA: Diagnosis not present

## 2022-10-14 DIAGNOSIS — E785 Hyperlipidemia, unspecified: Secondary | ICD-10-CM | POA: Diagnosis not present

## 2022-10-14 DIAGNOSIS — E1165 Type 2 diabetes mellitus with hyperglycemia: Secondary | ICD-10-CM | POA: Diagnosis not present

## 2022-10-16 DIAGNOSIS — M25561 Pain in right knee: Secondary | ICD-10-CM | POA: Diagnosis not present

## 2022-10-16 DIAGNOSIS — G8929 Other chronic pain: Secondary | ICD-10-CM | POA: Diagnosis not present

## 2022-10-16 DIAGNOSIS — M7918 Myalgia, other site: Secondary | ICD-10-CM | POA: Diagnosis not present

## 2022-10-26 DIAGNOSIS — I219 Acute myocardial infarction, unspecified: Secondary | ICD-10-CM | POA: Diagnosis not present

## 2022-10-26 DIAGNOSIS — I4510 Unspecified right bundle-branch block: Secondary | ICD-10-CM | POA: Diagnosis not present

## 2022-10-26 DIAGNOSIS — J439 Emphysema, unspecified: Secondary | ICD-10-CM | POA: Diagnosis not present

## 2022-10-26 DIAGNOSIS — D649 Anemia, unspecified: Secondary | ICD-10-CM | POA: Diagnosis not present

## 2022-10-26 DIAGNOSIS — N1831 Chronic kidney disease, stage 3a: Secondary | ICD-10-CM | POA: Diagnosis not present

## 2022-10-26 DIAGNOSIS — R079 Chest pain, unspecified: Secondary | ICD-10-CM | POA: Diagnosis not present

## 2022-10-26 DIAGNOSIS — E8721 Acute metabolic acidosis: Secondary | ICD-10-CM | POA: Diagnosis not present

## 2022-10-26 DIAGNOSIS — E872 Acidosis, unspecified: Secondary | ICD-10-CM | POA: Diagnosis not present

## 2022-10-26 DIAGNOSIS — M7731 Calcaneal spur, right foot: Secondary | ICD-10-CM | POA: Diagnosis not present

## 2022-10-26 DIAGNOSIS — I2489 Other forms of acute ischemic heart disease: Secondary | ICD-10-CM | POA: Diagnosis not present

## 2022-10-26 DIAGNOSIS — I10 Essential (primary) hypertension: Secondary | ICD-10-CM | POA: Diagnosis not present

## 2022-10-26 DIAGNOSIS — F419 Anxiety disorder, unspecified: Secondary | ICD-10-CM | POA: Diagnosis not present

## 2022-10-26 DIAGNOSIS — E669 Obesity, unspecified: Secondary | ICD-10-CM | POA: Diagnosis not present

## 2022-10-26 DIAGNOSIS — M6282 Rhabdomyolysis: Secondary | ICD-10-CM | POA: Diagnosis not present

## 2022-10-26 DIAGNOSIS — R443 Hallucinations, unspecified: Secondary | ICD-10-CM | POA: Diagnosis not present

## 2022-10-26 DIAGNOSIS — R296 Repeated falls: Secondary | ICD-10-CM | POA: Diagnosis not present

## 2022-10-26 DIAGNOSIS — D509 Iron deficiency anemia, unspecified: Secondary | ICD-10-CM | POA: Diagnosis not present

## 2022-10-26 DIAGNOSIS — M25471 Effusion, right ankle: Secondary | ICD-10-CM | POA: Diagnosis not present

## 2022-10-26 DIAGNOSIS — G629 Polyneuropathy, unspecified: Secondary | ICD-10-CM | POA: Diagnosis not present

## 2022-10-26 DIAGNOSIS — J449 Chronic obstructive pulmonary disease, unspecified: Secondary | ICD-10-CM | POA: Diagnosis not present

## 2022-10-26 DIAGNOSIS — R600 Localized edema: Secondary | ICD-10-CM | POA: Diagnosis not present

## 2022-10-26 DIAGNOSIS — Z743 Need for continuous supervision: Secondary | ICD-10-CM | POA: Diagnosis not present

## 2022-10-26 DIAGNOSIS — E785 Hyperlipidemia, unspecified: Secondary | ICD-10-CM | POA: Diagnosis not present

## 2022-10-26 DIAGNOSIS — E876 Hypokalemia: Secondary | ICD-10-CM | POA: Diagnosis not present

## 2022-10-26 DIAGNOSIS — N170 Acute kidney failure with tubular necrosis: Secondary | ICD-10-CM | POA: Diagnosis not present

## 2022-10-26 DIAGNOSIS — R778 Other specified abnormalities of plasma proteins: Secondary | ICD-10-CM | POA: Diagnosis not present

## 2022-10-26 DIAGNOSIS — N183 Chronic kidney disease, stage 3 unspecified: Secondary | ICD-10-CM | POA: Diagnosis not present

## 2022-10-26 DIAGNOSIS — R42 Dizziness and giddiness: Secondary | ICD-10-CM | POA: Diagnosis not present

## 2022-10-26 DIAGNOSIS — N179 Acute kidney failure, unspecified: Secondary | ICD-10-CM | POA: Diagnosis not present

## 2022-10-26 DIAGNOSIS — F32A Depression, unspecified: Secondary | ICD-10-CM | POA: Diagnosis not present

## 2022-10-26 DIAGNOSIS — M25472 Effusion, left ankle: Secondary | ICD-10-CM | POA: Diagnosis not present

## 2022-10-26 DIAGNOSIS — D72829 Elevated white blood cell count, unspecified: Secondary | ICD-10-CM | POA: Diagnosis not present

## 2022-10-26 DIAGNOSIS — I959 Hypotension, unspecified: Secondary | ICD-10-CM | POA: Diagnosis not present

## 2022-10-26 DIAGNOSIS — E1142 Type 2 diabetes mellitus with diabetic polyneuropathy: Secondary | ICD-10-CM | POA: Diagnosis not present

## 2022-10-26 DIAGNOSIS — Z66 Do not resuscitate: Secondary | ICD-10-CM | POA: Diagnosis not present

## 2022-10-26 DIAGNOSIS — I5A Non-ischemic myocardial injury (non-traumatic): Secondary | ICD-10-CM | POA: Diagnosis not present

## 2022-10-26 DIAGNOSIS — E1140 Type 2 diabetes mellitus with diabetic neuropathy, unspecified: Secondary | ICD-10-CM | POA: Diagnosis not present

## 2022-10-26 DIAGNOSIS — D638 Anemia in other chronic diseases classified elsewhere: Secondary | ICD-10-CM | POA: Diagnosis not present

## 2022-10-26 DIAGNOSIS — M7989 Other specified soft tissue disorders: Secondary | ICD-10-CM | POA: Diagnosis not present

## 2022-10-26 DIAGNOSIS — E871 Hypo-osmolality and hyponatremia: Secondary | ICD-10-CM | POA: Diagnosis not present

## 2022-10-26 DIAGNOSIS — R531 Weakness: Secondary | ICD-10-CM | POA: Diagnosis not present

## 2022-10-27 DIAGNOSIS — I4510 Unspecified right bundle-branch block: Secondary | ICD-10-CM | POA: Diagnosis not present

## 2022-10-27 DIAGNOSIS — E872 Acidosis, unspecified: Secondary | ICD-10-CM | POA: Diagnosis not present

## 2022-10-27 DIAGNOSIS — M6282 Rhabdomyolysis: Secondary | ICD-10-CM | POA: Diagnosis not present

## 2022-10-27 DIAGNOSIS — N183 Chronic kidney disease, stage 3 unspecified: Secondary | ICD-10-CM | POA: Diagnosis not present

## 2022-10-27 DIAGNOSIS — N179 Acute kidney failure, unspecified: Secondary | ICD-10-CM | POA: Diagnosis not present

## 2022-10-27 DIAGNOSIS — D509 Iron deficiency anemia, unspecified: Secondary | ICD-10-CM | POA: Diagnosis not present

## 2022-10-27 DIAGNOSIS — D638 Anemia in other chronic diseases classified elsewhere: Secondary | ICD-10-CM | POA: Diagnosis not present

## 2022-10-28 DIAGNOSIS — N179 Acute kidney failure, unspecified: Secondary | ICD-10-CM | POA: Diagnosis not present

## 2022-10-28 DIAGNOSIS — D649 Anemia, unspecified: Secondary | ICD-10-CM | POA: Diagnosis not present

## 2022-10-28 DIAGNOSIS — M6282 Rhabdomyolysis: Secondary | ICD-10-CM | POA: Diagnosis not present

## 2022-10-28 DIAGNOSIS — R531 Weakness: Secondary | ICD-10-CM | POA: Diagnosis not present

## 2022-10-28 DIAGNOSIS — R079 Chest pain, unspecified: Secondary | ICD-10-CM | POA: Diagnosis not present

## 2022-10-28 DIAGNOSIS — R443 Hallucinations, unspecified: Secondary | ICD-10-CM | POA: Diagnosis not present

## 2022-10-28 DIAGNOSIS — I10 Essential (primary) hypertension: Secondary | ICD-10-CM | POA: Diagnosis not present

## 2022-10-28 DIAGNOSIS — E785 Hyperlipidemia, unspecified: Secondary | ICD-10-CM | POA: Diagnosis not present

## 2022-10-28 DIAGNOSIS — J449 Chronic obstructive pulmonary disease, unspecified: Secondary | ICD-10-CM | POA: Diagnosis not present

## 2022-10-29 DIAGNOSIS — R600 Localized edema: Secondary | ICD-10-CM | POA: Diagnosis not present

## 2022-10-29 DIAGNOSIS — R531 Weakness: Secondary | ICD-10-CM | POA: Diagnosis not present

## 2022-10-29 DIAGNOSIS — N179 Acute kidney failure, unspecified: Secondary | ICD-10-CM | POA: Diagnosis not present

## 2022-10-29 DIAGNOSIS — R443 Hallucinations, unspecified: Secondary | ICD-10-CM | POA: Diagnosis not present

## 2022-10-29 DIAGNOSIS — D649 Anemia, unspecified: Secondary | ICD-10-CM | POA: Diagnosis not present

## 2022-10-29 DIAGNOSIS — M6282 Rhabdomyolysis: Secondary | ICD-10-CM | POA: Diagnosis not present

## 2022-10-30 DIAGNOSIS — E872 Acidosis, unspecified: Secondary | ICD-10-CM | POA: Diagnosis not present

## 2022-10-30 DIAGNOSIS — M7989 Other specified soft tissue disorders: Secondary | ICD-10-CM | POA: Diagnosis not present

## 2022-10-30 DIAGNOSIS — N179 Acute kidney failure, unspecified: Secondary | ICD-10-CM | POA: Diagnosis not present

## 2022-10-30 DIAGNOSIS — M7731 Calcaneal spur, right foot: Secondary | ICD-10-CM | POA: Diagnosis not present

## 2022-10-30 DIAGNOSIS — D649 Anemia, unspecified: Secondary | ICD-10-CM | POA: Diagnosis not present

## 2022-10-31 DIAGNOSIS — E872 Acidosis, unspecified: Secondary | ICD-10-CM | POA: Diagnosis not present

## 2022-10-31 DIAGNOSIS — D649 Anemia, unspecified: Secondary | ICD-10-CM | POA: Diagnosis not present

## 2022-10-31 DIAGNOSIS — N179 Acute kidney failure, unspecified: Secondary | ICD-10-CM | POA: Diagnosis not present

## 2022-10-31 DIAGNOSIS — M25472 Effusion, left ankle: Secondary | ICD-10-CM | POA: Diagnosis not present

## 2022-11-01 DIAGNOSIS — D649 Anemia, unspecified: Secondary | ICD-10-CM | POA: Diagnosis not present

## 2022-11-01 DIAGNOSIS — E872 Acidosis, unspecified: Secondary | ICD-10-CM | POA: Diagnosis not present

## 2022-11-01 DIAGNOSIS — R531 Weakness: Secondary | ICD-10-CM | POA: Diagnosis not present

## 2022-11-01 DIAGNOSIS — M25472 Effusion, left ankle: Secondary | ICD-10-CM | POA: Diagnosis not present

## 2022-11-01 DIAGNOSIS — N179 Acute kidney failure, unspecified: Secondary | ICD-10-CM | POA: Diagnosis not present

## 2022-11-02 DIAGNOSIS — N179 Acute kidney failure, unspecified: Secondary | ICD-10-CM | POA: Diagnosis not present

## 2022-11-02 DIAGNOSIS — D649 Anemia, unspecified: Secondary | ICD-10-CM | POA: Diagnosis not present

## 2022-11-02 DIAGNOSIS — M25472 Effusion, left ankle: Secondary | ICD-10-CM | POA: Diagnosis not present

## 2022-11-02 DIAGNOSIS — E872 Acidosis, unspecified: Secondary | ICD-10-CM | POA: Diagnosis not present

## 2022-11-03 DIAGNOSIS — E872 Acidosis, unspecified: Secondary | ICD-10-CM | POA: Diagnosis not present

## 2022-11-03 DIAGNOSIS — N179 Acute kidney failure, unspecified: Secondary | ICD-10-CM | POA: Diagnosis not present

## 2022-11-03 DIAGNOSIS — M25472 Effusion, left ankle: Secondary | ICD-10-CM | POA: Diagnosis not present

## 2022-11-03 DIAGNOSIS — D649 Anemia, unspecified: Secondary | ICD-10-CM | POA: Diagnosis not present

## 2022-11-04 DIAGNOSIS — F419 Anxiety disorder, unspecified: Secondary | ICD-10-CM | POA: Diagnosis not present

## 2022-11-04 DIAGNOSIS — J449 Chronic obstructive pulmonary disease, unspecified: Secondary | ICD-10-CM | POA: Diagnosis not present

## 2022-11-04 DIAGNOSIS — Z89429 Acquired absence of other toe(s), unspecified side: Secondary | ICD-10-CM | POA: Diagnosis not present

## 2022-11-04 DIAGNOSIS — I82491 Acute embolism and thrombosis of other specified deep vein of right lower extremity: Secondary | ICD-10-CM | POA: Diagnosis not present

## 2022-11-04 DIAGNOSIS — F1120 Opioid dependence, uncomplicated: Secondary | ICD-10-CM | POA: Diagnosis not present

## 2022-11-04 DIAGNOSIS — E612 Magnesium deficiency: Secondary | ICD-10-CM | POA: Diagnosis not present

## 2022-11-04 DIAGNOSIS — Z4781 Encounter for orthopedic aftercare following surgical amputation: Secondary | ICD-10-CM | POA: Diagnosis not present

## 2022-11-04 DIAGNOSIS — N179 Acute kidney failure, unspecified: Secondary | ICD-10-CM | POA: Diagnosis not present

## 2022-11-04 DIAGNOSIS — R531 Weakness: Secondary | ICD-10-CM | POA: Diagnosis not present

## 2022-11-04 DIAGNOSIS — Z6832 Body mass index (BMI) 32.0-32.9, adult: Secondary | ICD-10-CM | POA: Diagnosis not present

## 2022-11-04 DIAGNOSIS — D649 Anemia, unspecified: Secondary | ICD-10-CM | POA: Diagnosis not present

## 2022-11-04 DIAGNOSIS — Z87448 Personal history of other diseases of urinary system: Secondary | ICD-10-CM | POA: Diagnosis not present

## 2022-11-04 DIAGNOSIS — M25471 Effusion, right ankle: Secondary | ICD-10-CM | POA: Diagnosis not present

## 2022-11-04 DIAGNOSIS — R600 Localized edema: Secondary | ICD-10-CM | POA: Diagnosis not present

## 2022-11-04 DIAGNOSIS — Z8739 Personal history of other diseases of the musculoskeletal system and connective tissue: Secondary | ICD-10-CM | POA: Diagnosis not present

## 2022-11-04 DIAGNOSIS — I219 Acute myocardial infarction, unspecified: Secondary | ICD-10-CM | POA: Diagnosis not present

## 2022-11-04 DIAGNOSIS — E1140 Type 2 diabetes mellitus with diabetic neuropathy, unspecified: Secondary | ICD-10-CM | POA: Diagnosis not present

## 2022-11-04 DIAGNOSIS — F32A Depression, unspecified: Secondary | ICD-10-CM | POA: Diagnosis not present

## 2022-11-04 DIAGNOSIS — E1122 Type 2 diabetes mellitus with diabetic chronic kidney disease: Secondary | ICD-10-CM | POA: Diagnosis not present

## 2022-11-04 DIAGNOSIS — G629 Polyneuropathy, unspecified: Secondary | ICD-10-CM | POA: Diagnosis not present

## 2022-11-04 DIAGNOSIS — I10 Essential (primary) hypertension: Secondary | ICD-10-CM | POA: Diagnosis not present

## 2022-11-04 DIAGNOSIS — E8721 Acute metabolic acidosis: Secondary | ICD-10-CM | POA: Diagnosis not present

## 2022-11-04 DIAGNOSIS — D500 Iron deficiency anemia secondary to blood loss (chronic): Secondary | ICD-10-CM | POA: Diagnosis not present

## 2022-11-04 DIAGNOSIS — F39 Unspecified mood [affective] disorder: Secondary | ICD-10-CM | POA: Diagnosis not present

## 2022-11-04 DIAGNOSIS — M79604 Pain in right leg: Secondary | ICD-10-CM | POA: Diagnosis not present

## 2022-11-04 DIAGNOSIS — N1832 Chronic kidney disease, stage 3b: Secondary | ICD-10-CM | POA: Diagnosis not present

## 2022-11-04 DIAGNOSIS — Z743 Need for continuous supervision: Secondary | ICD-10-CM | POA: Diagnosis not present

## 2022-11-04 DIAGNOSIS — N189 Chronic kidney disease, unspecified: Secondary | ICD-10-CM | POA: Diagnosis not present

## 2022-11-04 DIAGNOSIS — N1831 Chronic kidney disease, stage 3a: Secondary | ICD-10-CM | POA: Diagnosis not present

## 2022-11-04 DIAGNOSIS — K5901 Slow transit constipation: Secondary | ICD-10-CM | POA: Diagnosis not present

## 2022-11-04 DIAGNOSIS — E785 Hyperlipidemia, unspecified: Secondary | ICD-10-CM | POA: Diagnosis not present

## 2022-11-04 DIAGNOSIS — D539 Nutritional anemia, unspecified: Secondary | ICD-10-CM | POA: Diagnosis not present

## 2022-11-04 DIAGNOSIS — D631 Anemia in chronic kidney disease: Secondary | ICD-10-CM | POA: Diagnosis not present

## 2022-11-04 DIAGNOSIS — I5A Non-ischemic myocardial injury (non-traumatic): Secondary | ICD-10-CM | POA: Diagnosis not present

## 2022-11-04 DIAGNOSIS — E119 Type 2 diabetes mellitus without complications: Secondary | ICD-10-CM | POA: Diagnosis not present

## 2022-11-04 DIAGNOSIS — N170 Acute kidney failure with tubular necrosis: Secondary | ICD-10-CM | POA: Diagnosis not present

## 2022-11-04 DIAGNOSIS — F4323 Adjustment disorder with mixed anxiety and depressed mood: Secondary | ICD-10-CM | POA: Diagnosis not present

## 2022-11-04 DIAGNOSIS — D72829 Elevated white blood cell count, unspecified: Secondary | ICD-10-CM | POA: Diagnosis not present

## 2022-11-04 DIAGNOSIS — T796XXD Traumatic ischemia of muscle, subsequent encounter: Secondary | ICD-10-CM | POA: Diagnosis not present

## 2022-11-04 DIAGNOSIS — E46 Unspecified protein-calorie malnutrition: Secondary | ICD-10-CM | POA: Diagnosis not present

## 2022-11-04 DIAGNOSIS — M6282 Rhabdomyolysis: Secondary | ICD-10-CM | POA: Diagnosis not present

## 2022-11-05 DIAGNOSIS — J449 Chronic obstructive pulmonary disease, unspecified: Secondary | ICD-10-CM | POA: Diagnosis not present

## 2022-11-05 DIAGNOSIS — Z6832 Body mass index (BMI) 32.0-32.9, adult: Secondary | ICD-10-CM | POA: Diagnosis not present

## 2022-11-05 DIAGNOSIS — E1122 Type 2 diabetes mellitus with diabetic chronic kidney disease: Secondary | ICD-10-CM | POA: Diagnosis not present

## 2022-11-05 DIAGNOSIS — E46 Unspecified protein-calorie malnutrition: Secondary | ICD-10-CM | POA: Diagnosis not present

## 2022-11-05 DIAGNOSIS — I5A Non-ischemic myocardial injury (non-traumatic): Secondary | ICD-10-CM | POA: Diagnosis not present

## 2022-11-05 DIAGNOSIS — F1120 Opioid dependence, uncomplicated: Secondary | ICD-10-CM | POA: Diagnosis not present

## 2022-11-05 DIAGNOSIS — T796XXD Traumatic ischemia of muscle, subsequent encounter: Secondary | ICD-10-CM | POA: Diagnosis not present

## 2022-11-05 DIAGNOSIS — E1140 Type 2 diabetes mellitus with diabetic neuropathy, unspecified: Secondary | ICD-10-CM | POA: Diagnosis not present

## 2022-11-05 DIAGNOSIS — F4323 Adjustment disorder with mixed anxiety and depressed mood: Secondary | ICD-10-CM | POA: Diagnosis not present

## 2022-11-05 DIAGNOSIS — F39 Unspecified mood [affective] disorder: Secondary | ICD-10-CM | POA: Diagnosis not present

## 2022-11-05 DIAGNOSIS — N170 Acute kidney failure with tubular necrosis: Secondary | ICD-10-CM | POA: Diagnosis not present

## 2022-11-05 DIAGNOSIS — N189 Chronic kidney disease, unspecified: Secondary | ICD-10-CM | POA: Diagnosis not present

## 2022-11-05 DIAGNOSIS — R600 Localized edema: Secondary | ICD-10-CM | POA: Diagnosis not present

## 2022-11-05 DIAGNOSIS — Z87448 Personal history of other diseases of urinary system: Secondary | ICD-10-CM | POA: Diagnosis not present

## 2022-11-07 DIAGNOSIS — D631 Anemia in chronic kidney disease: Secondary | ICD-10-CM | POA: Diagnosis not present

## 2022-11-07 DIAGNOSIS — Z6832 Body mass index (BMI) 32.0-32.9, adult: Secondary | ICD-10-CM | POA: Diagnosis not present

## 2022-11-07 DIAGNOSIS — E1122 Type 2 diabetes mellitus with diabetic chronic kidney disease: Secondary | ICD-10-CM | POA: Diagnosis not present

## 2022-11-07 DIAGNOSIS — N1832 Chronic kidney disease, stage 3b: Secondary | ICD-10-CM | POA: Diagnosis not present

## 2022-11-07 DIAGNOSIS — E46 Unspecified protein-calorie malnutrition: Secondary | ICD-10-CM | POA: Diagnosis not present

## 2022-11-07 DIAGNOSIS — R600 Localized edema: Secondary | ICD-10-CM | POA: Diagnosis not present

## 2022-11-07 DIAGNOSIS — F4323 Adjustment disorder with mixed anxiety and depressed mood: Secondary | ICD-10-CM | POA: Diagnosis not present

## 2022-11-12 DIAGNOSIS — N1831 Chronic kidney disease, stage 3a: Secondary | ICD-10-CM | POA: Diagnosis not present

## 2022-11-12 DIAGNOSIS — K5901 Slow transit constipation: Secondary | ICD-10-CM | POA: Diagnosis not present

## 2022-11-12 DIAGNOSIS — D539 Nutritional anemia, unspecified: Secondary | ICD-10-CM | POA: Diagnosis not present

## 2022-11-12 DIAGNOSIS — Z8739 Personal history of other diseases of the musculoskeletal system and connective tissue: Secondary | ICD-10-CM | POA: Diagnosis not present

## 2022-11-17 DIAGNOSIS — F4323 Adjustment disorder with mixed anxiety and depressed mood: Secondary | ICD-10-CM | POA: Diagnosis not present

## 2022-11-17 DIAGNOSIS — F1120 Opioid dependence, uncomplicated: Secondary | ICD-10-CM | POA: Diagnosis not present

## 2022-11-17 DIAGNOSIS — N1832 Chronic kidney disease, stage 3b: Secondary | ICD-10-CM | POA: Diagnosis not present

## 2022-11-17 DIAGNOSIS — R5381 Other malaise: Secondary | ICD-10-CM | POA: Diagnosis not present

## 2022-11-17 DIAGNOSIS — E1122 Type 2 diabetes mellitus with diabetic chronic kidney disease: Secondary | ICD-10-CM | POA: Diagnosis not present

## 2022-11-17 DIAGNOSIS — R600 Localized edema: Secondary | ICD-10-CM | POA: Diagnosis not present

## 2022-11-17 DIAGNOSIS — G8929 Other chronic pain: Secondary | ICD-10-CM | POA: Diagnosis not present

## 2022-11-17 DIAGNOSIS — E1140 Type 2 diabetes mellitus with diabetic neuropathy, unspecified: Secondary | ICD-10-CM | POA: Diagnosis not present

## 2022-11-17 DIAGNOSIS — J449 Chronic obstructive pulmonary disease, unspecified: Secondary | ICD-10-CM | POA: Diagnosis not present

## 2022-11-25 DIAGNOSIS — F411 Generalized anxiety disorder: Secondary | ICD-10-CM | POA: Diagnosis not present

## 2022-11-25 DIAGNOSIS — F330 Major depressive disorder, recurrent, mild: Secondary | ICD-10-CM | POA: Diagnosis not present

## 2022-12-12 DIAGNOSIS — I10 Essential (primary) hypertension: Secondary | ICD-10-CM | POA: Diagnosis not present

## 2022-12-12 DIAGNOSIS — E1165 Type 2 diabetes mellitus with hyperglycemia: Secondary | ICD-10-CM | POA: Diagnosis not present

## 2022-12-12 DIAGNOSIS — M25571 Pain in right ankle and joints of right foot: Secondary | ICD-10-CM | POA: Diagnosis not present

## 2022-12-15 IMAGING — RF DG ESOPHAGUS
9 of 11 series · 15 of 22 positions shown · non-contrast
Comparison: Abdominal CT, dictated separately

CLINICAL DATA: Known chronic hiatal hernia with progressive chest
and abdominal pressure. Early satiety. Reflux symptoms.

EXAM:
ESOPHOGRAM/BARIUM SWALLOW
TECHNIQUE: Single contrast examination was performed using  thin barium.
FLUOROSCOPY TIME:  Fluoroscopy Time:  3 minutes and 30 seconds
Radiation Exposure Index (if provided by the fluoroscopic device):
102.1
Number of Acquired Spot Images: 0

[Series 1: cp_standard · 0.54mm/px · 3 of 175 frames shown (1 of 7)]
[frame 27/175]
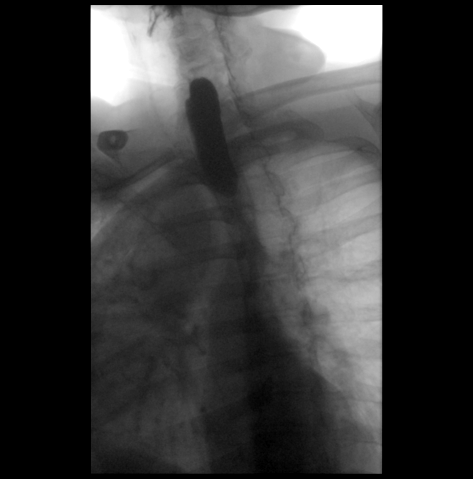
[frame 143/175]
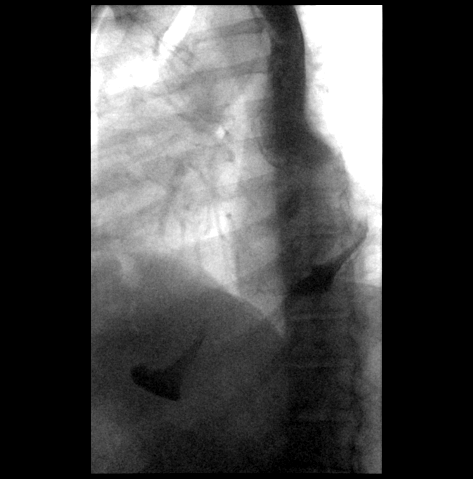
[frame 149/175]
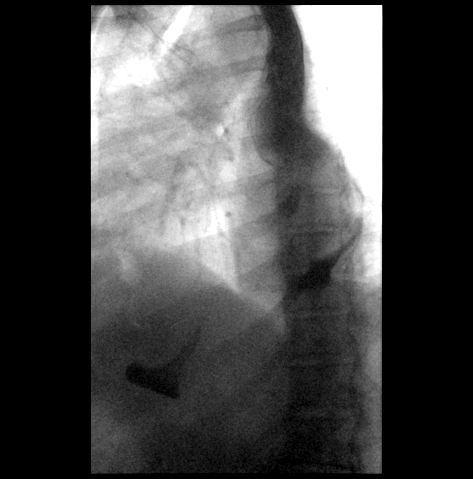

[Series 2: cp_standard · 0.54mm/px · 2 of 200 frames shown (2 of 7)]
[frame 101/200]
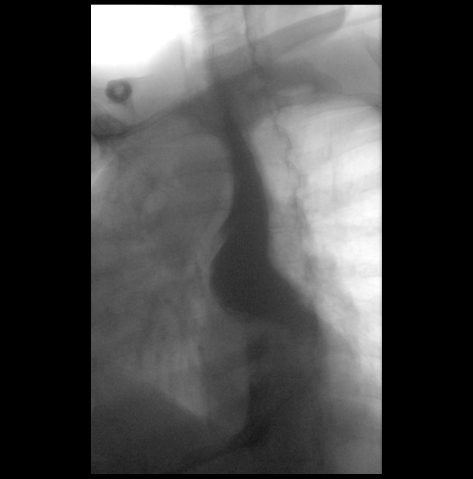
[frame 171/200]
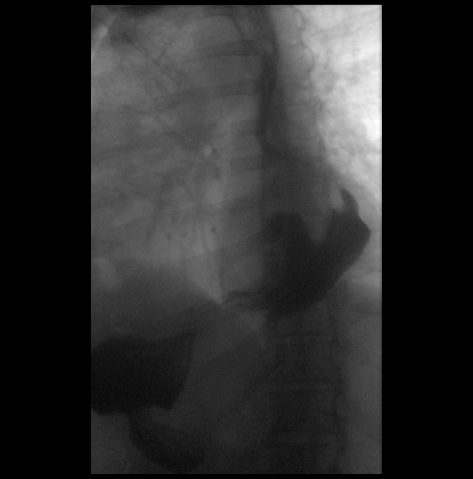

[Series 3: cp_standard · 0.54mm/px · 3 of 618 frames shown (3 of 7)]
[frame 1/618]
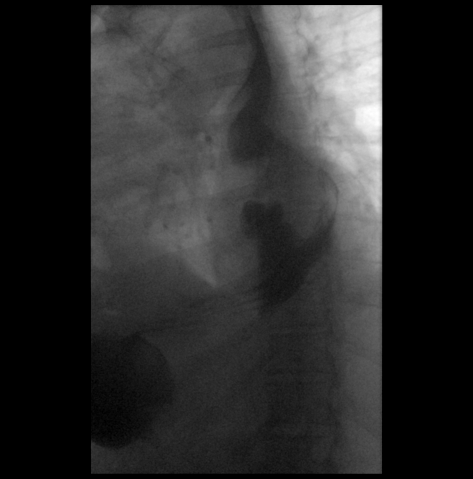
[frame 93/618]
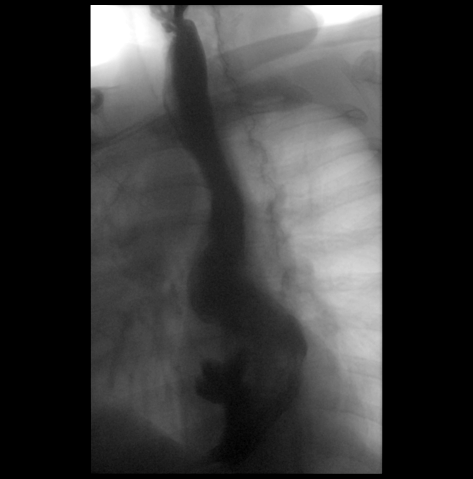
[frame 526/618]
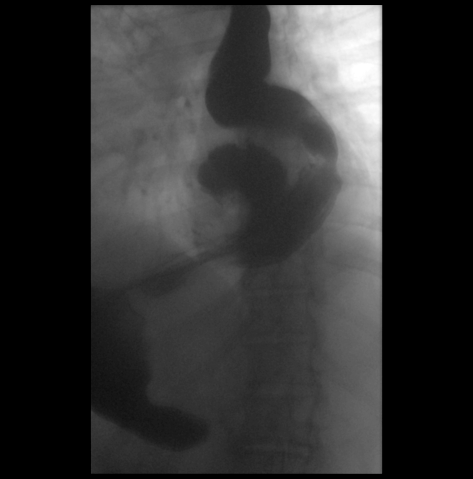

[Series 4: cp_standard · 0.27mm/px · 1 of 1 slices shown (4 of 7)]
[im 1/1]
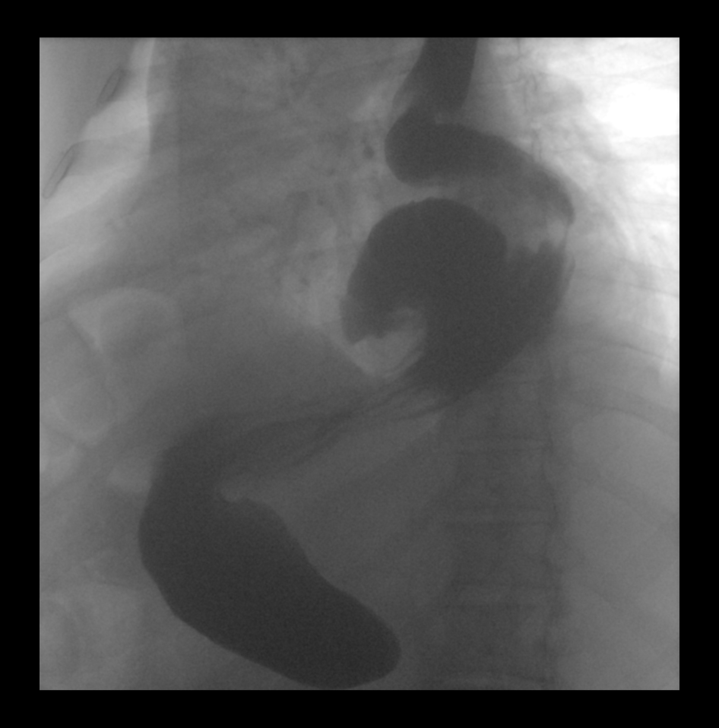

[Series 5: cp_standard · 0.27mm/px · 1 of 1 slices shown (5 of 7)]
[im 1/1]
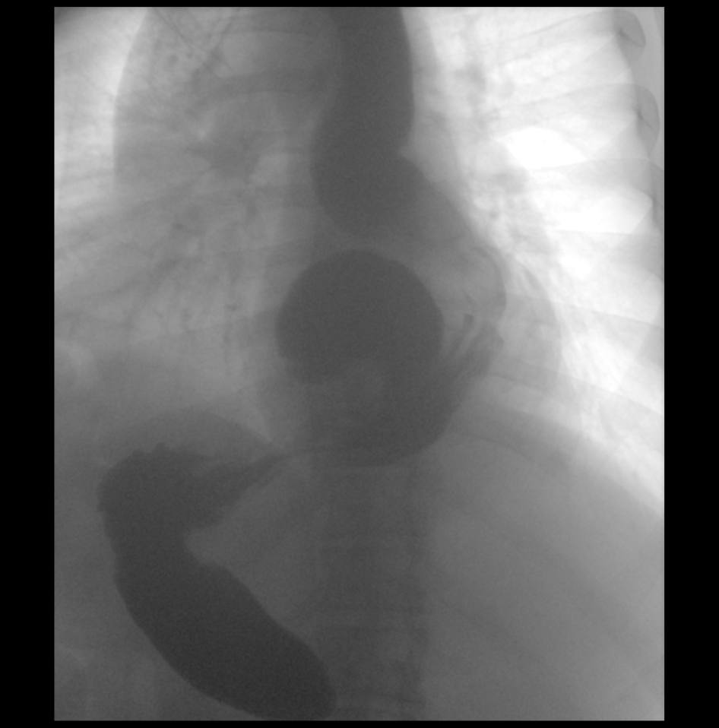

[Series 7: cp_standard · 0.27mm/px · 1 of 1 slices shown (6 of 7)]
[im 1/1]
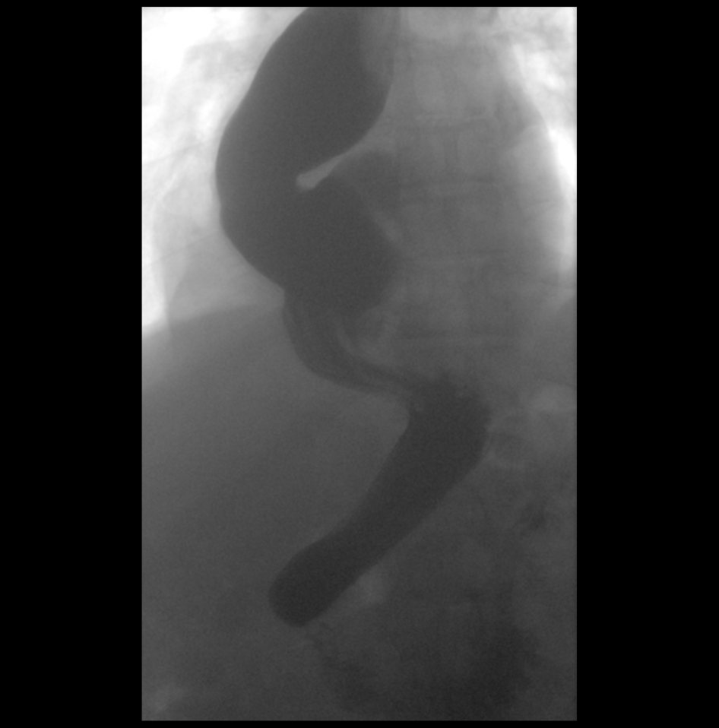

[Series 8: cp_standard · 0.26mm/px · 1 of 1 slices shown (7 of 7)]
[im 1/1]
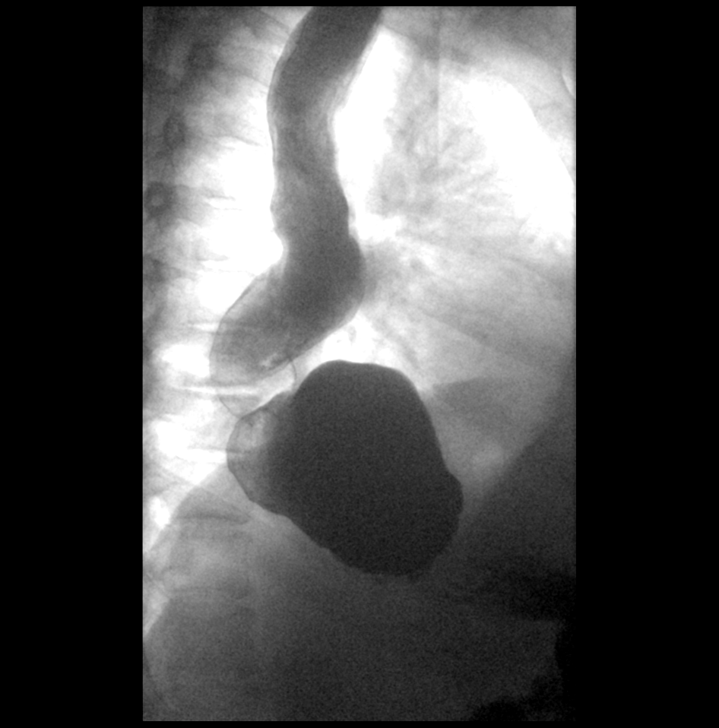

[Series 10: fluoro_barium 2fps_bw · 0.18mm/px · 2 of 2 frames shown (1 of 2)]
[frame 1/2]
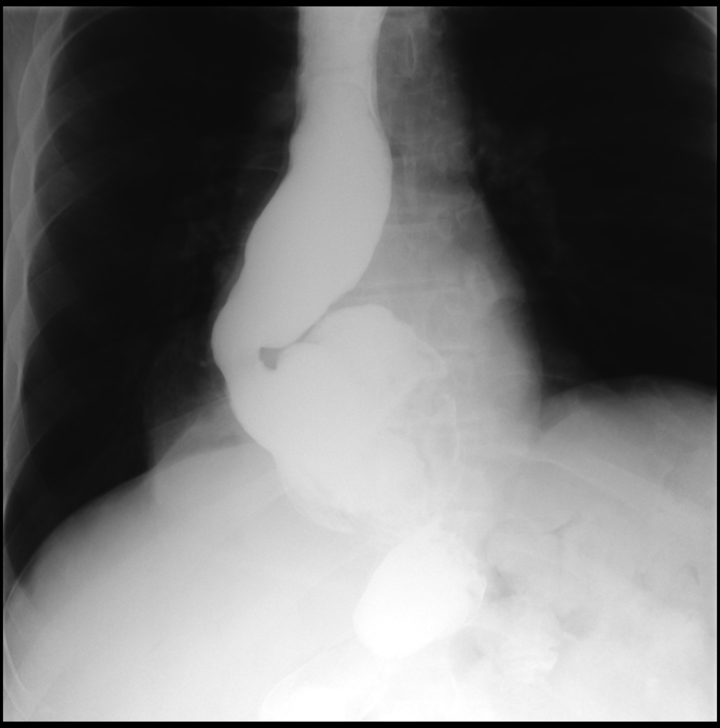
[frame 2/2]
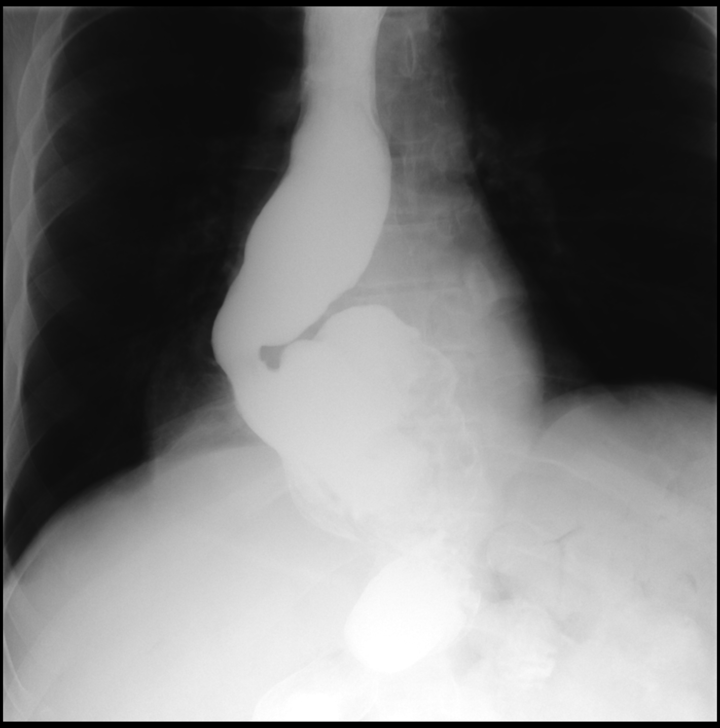

[Series 11: fluoro_barium 2fps_bw · 0.18mm/px · 1 of 2 frames shown (2 of 2)]
[frame 2/2]
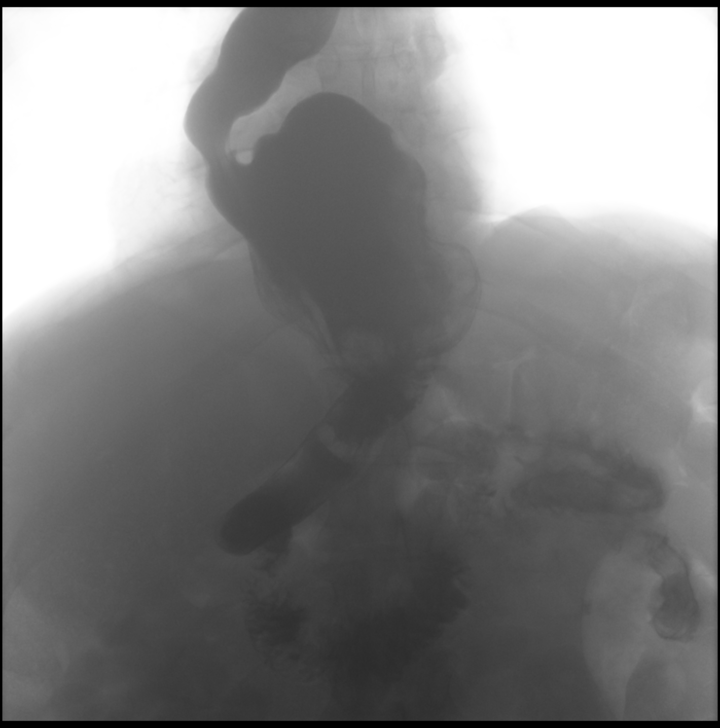

[15 of 22 positions shown; findings below may reference images not displayed]

FINDINGS: Evaluation of esophageal motility demonstrates proximal escape waves
and contrast stasis on 1. Throughout the exam, there is contrast
stasis in the upper esophagus.

Full column evaluation of the esophagus demonstrates a large hiatal
hernia, with greater than [DATE] of the stomach positioned in the
chest. No complicating obstruction or volvulus.
IMPRESSION: Large hiatal hernia, with greater than [DATE] of the stomach positioned
in the chest. Concurrent esophageal dysmotility, likely
presbyesophagus.

## 2023-01-27 DIAGNOSIS — J449 Chronic obstructive pulmonary disease, unspecified: Secondary | ICD-10-CM

## 2023-01-27 DIAGNOSIS — E785 Hyperlipidemia, unspecified: Secondary | ICD-10-CM

## 2023-01-27 DIAGNOSIS — I10 Essential (primary) hypertension: Secondary | ICD-10-CM

## 2023-01-30 DIAGNOSIS — S82391A Other fracture of lower end of right tibia, initial encounter for closed fracture: Secondary | ICD-10-CM | POA: Diagnosis not present

## 2023-01-30 DIAGNOSIS — S93431A Sprain of tibiofibular ligament of right ankle, initial encounter: Secondary | ICD-10-CM | POA: Diagnosis not present

## 2023-01-30 DIAGNOSIS — S99911A Unspecified injury of right ankle, initial encounter: Secondary | ICD-10-CM | POA: Diagnosis not present

## 2023-01-30 DIAGNOSIS — M14671 Charcot's joint, right ankle and foot: Secondary | ICD-10-CM | POA: Diagnosis not present

## 2023-02-04 ENCOUNTER — Telehealth: Payer: Self-pay

## 2023-02-04 IMAGING — DX DG CHEST 1V PORT
1 series · 1 of 1 positions shown · non-contrast
Comparison: September 26, 2021

CLINICAL DATA: Status post paraesophageal hernia repair. Low O2
sats.

EXAM:
PORTABLE CHEST 1 VIEW

[chest ap]
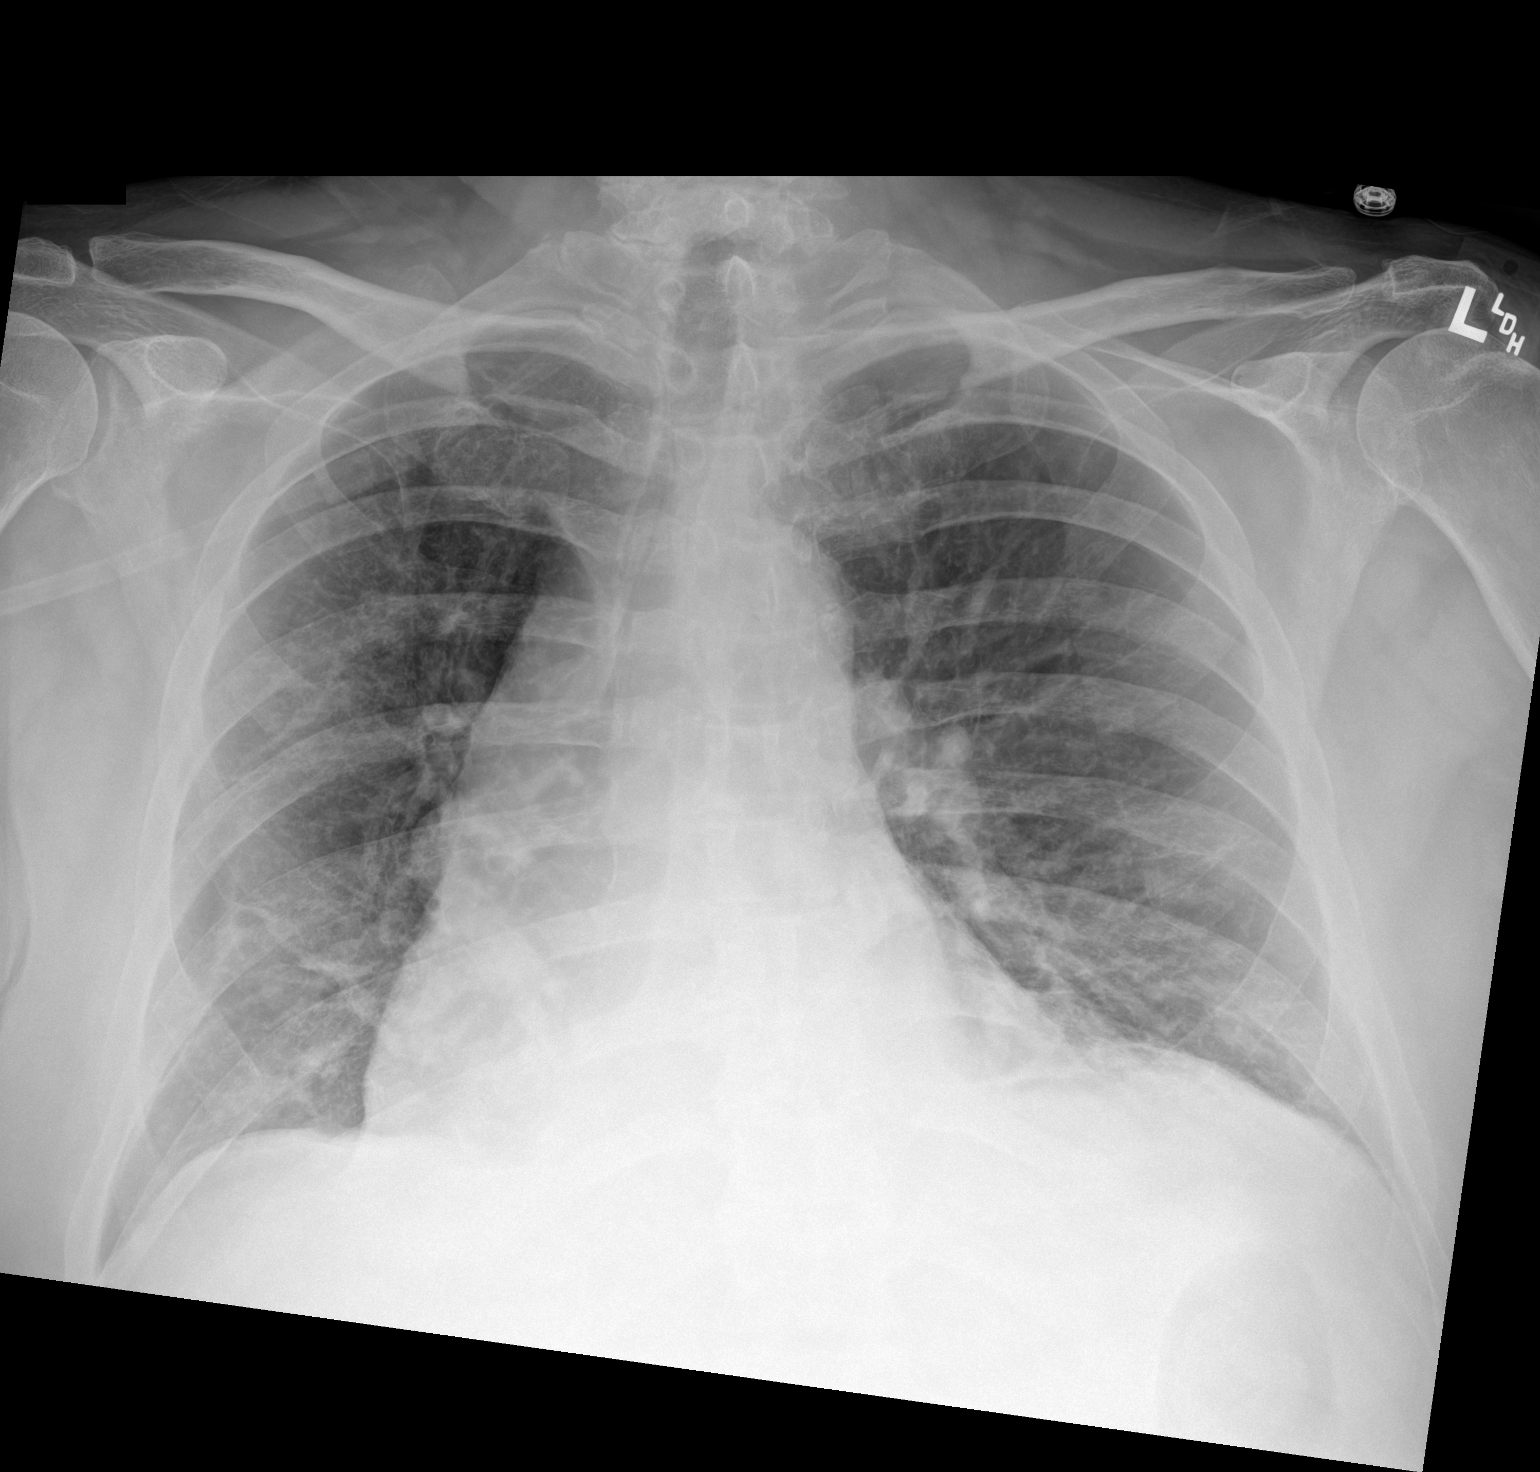

[1 of 1 positions shown; findings below may reference images not displayed]

FINDINGS: No pneumothorax. The cardiomediastinal silhouette is stable with
stable cardiomegaly. Right chest tube has been removed. Mild
bibasilar atelectasis. No other acute abnormalities.
IMPRESSION: Removal of right chest tube. No pneumothorax. Streaky opacities in
the bases, likely atelectasis. No other interval changes.

## 2023-02-04 NOTE — Telephone Encounter (Signed)
Anderson Malta PT with [facility] called and left vm requesting a call back regarding verbals for physical therapy for pt (said for re-certification) 1w9. Jennifer's call back: [phone]

## 2023-02-10 ENCOUNTER — Telehealth: Payer: Self-pay

## 2023-02-10 DIAGNOSIS — F330 Major depressive disorder, recurrent, mild: Secondary | ICD-10-CM | POA: Diagnosis not present

## 2023-02-10 DIAGNOSIS — F411 Generalized anxiety disorder: Secondary | ICD-10-CM | POA: Diagnosis not present

## 2023-02-11 NOTE — Telephone Encounter (Signed)
HLD Review Call  [id number] years, Male  DOB: 06/04/1946  M: [phone]  __________________________________________________ Hyperlipidemia/Dyslipidemia Review (HC) Lipid Panel Date: 10/14/2022 TC: 111 LDL: 26 HDL: 42 TG: 296 Are TC > 200, LDL >100, HDL < 40, TG > 150?: Yes What recent interventions have been made by any provider to improve the patient's conditions in the last 3 months?: 01/30/23 Ortho Surg  Roel Cluck For initial consult No medication changes. Has there been any documented recent hospitalizations or ED visits since last visit with Clinical Lead?: No Adherence Review Does the [facility] have access to medication refill data?: Yes Adherence rates for STAR metric medications: Rosuvastatin 40 mg - 07/07/22 90 DS Glipizide ER 5 mg - 01/09/23 90 DS Metformin 500 mg - 05/07/22 90 DS Lisinopril 40 mg - 09/21/22 90 DS Adherence rates for medications indicated for disease state being reviewed: Rosuvastatin 40 mg - 07/07/22 90 DS - Patient has not been seen by the doctor and they will not refill them till he does, offered transportation help. Does the patient have >5 day gap between last estimated fill dates for any of the above medications?: Yes Reasons for medication gaps: Metformin 500 mg - 05/07/22 90 DS - Patient has not been seen by the doctor and they will not refill them till he does, offered transportation help. Lisinopril 40 mg - 09/21/22 90 DS - Patient has not been seen by the doctor and they will not refill them till he does, offered transportation help. Rosuvastatin 40 mg - 07/07/22 90 DS - Patient has not been seen by the doctor and they will not refill them till he does, offered transportation help. Disease State Questions Able to connect with the Patient?: Yes Are you having any side effects from your cholesterol medications?: No What diet changes have you made to improve your Cholesterol?: eating more home-cooked meals What exercise are you doing to improve  your Cholesterol?: no formal exercise Misc. Response/Information:: Patient has not started taking dish oil from over the counter. Hoover Browns on 01/09/2023 01:13 PM hld call in 1 month - did patient start fish oil otc? Clinical Lead Review Review Adherence gaps identified?: No Drug Therapy Problems identified?: No Assessment: Controlled  Engagement Notes Jerral Ralph on 02/10/2023 03:39 PM reviewed : 98mns 5 secs 02/10/23 MCharlann Langeon 02/10/2023 02:18 PM [facility]: 10 min 02/10/23  [facility] CenterAssessment call time spent:5 min 02/10/23

## 2023-02-17 ENCOUNTER — Telehealth: Payer: Self-pay

## 2023-02-17 NOTE — Telephone Encounter (Signed)
Ronalee Belts PT with [facility] called stating that patient missed a visit, Anderson Malta PT with [facility] called asking to hold off on until he sees Ortho, Anderson Malta infomed that this was okay per [facility]

## 2023-02-18 DIAGNOSIS — N1831 Chronic kidney disease, stage 3a: Secondary | ICD-10-CM | POA: Diagnosis not present

## 2023-02-18 DIAGNOSIS — M1711 Unilateral primary osteoarthritis, right knee: Secondary | ICD-10-CM | POA: Diagnosis not present

## 2023-02-18 DIAGNOSIS — R6889 Other general symptoms and signs: Secondary | ICD-10-CM | POA: Diagnosis not present

## 2023-02-18 DIAGNOSIS — M14671 Charcot's joint, right ankle and foot: Secondary | ICD-10-CM | POA: Diagnosis not present

## 2023-02-26 DIAGNOSIS — E785 Hyperlipidemia, unspecified: Secondary | ICD-10-CM | POA: Diagnosis not present

## 2023-02-26 DIAGNOSIS — I10 Essential (primary) hypertension: Secondary | ICD-10-CM | POA: Diagnosis not present

## 2023-02-26 DIAGNOSIS — J449 Chronic obstructive pulmonary disease, unspecified: Secondary | ICD-10-CM | POA: Diagnosis not present

## 2023-02-28 DIAGNOSIS — R6889 Other general symptoms and signs: Secondary | ICD-10-CM | POA: Diagnosis not present

## 2023-02-28 DIAGNOSIS — F32A Depression, unspecified: Secondary | ICD-10-CM | POA: Diagnosis not present

## 2023-02-28 DIAGNOSIS — E669 Obesity, unspecified: Secondary | ICD-10-CM | POA: Diagnosis not present

## 2023-02-28 DIAGNOSIS — E119 Type 2 diabetes mellitus without complications: Secondary | ICD-10-CM | POA: Diagnosis not present

## 2023-02-28 DIAGNOSIS — N189 Chronic kidney disease, unspecified: Secondary | ICD-10-CM | POA: Diagnosis not present

## 2023-02-28 DIAGNOSIS — G894 Chronic pain syndrome: Secondary | ICD-10-CM | POA: Diagnosis not present

## 2023-02-28 DIAGNOSIS — M1711 Unilateral primary osteoarthritis, right knee: Secondary | ICD-10-CM | POA: Diagnosis not present

## 2023-02-28 DIAGNOSIS — G609 Hereditary and idiopathic neuropathy, unspecified: Secondary | ICD-10-CM | POA: Diagnosis not present

## 2023-03-02 ENCOUNTER — Telehealth: Payer: Self-pay

## 2023-03-02 NOTE — Telephone Encounter (Signed)
Patient Chris Mercer stating that he's been fighting a headache  x 1 month and that no medication seems to be helping he said his L side of his head hurts starting in his L eye through the L side of his head please advise what youd like pt to do

## 2023-03-04 ENCOUNTER — Other Ambulatory Visit: Payer: Self-pay | Admitting: Nurse Practitioner

## 2023-03-06 DIAGNOSIS — R6889 Other general symptoms and signs: Secondary | ICD-10-CM | POA: Diagnosis not present

## 2023-03-09 NOTE — Telephone Encounter (Signed)
Mailed letter asking pt to call office

## 2023-03-09 NOTE — Telephone Encounter (Signed)
Unable to LM, phone line gives busy signal

## 2023-03-13 DIAGNOSIS — L97312 Non-pressure chronic ulcer of right ankle with fat layer exposed: Secondary | ICD-10-CM | POA: Diagnosis not present

## 2023-03-13 DIAGNOSIS — R6889 Other general symptoms and signs: Secondary | ICD-10-CM | POA: Diagnosis not present

## 2023-03-13 DIAGNOSIS — E1142 Type 2 diabetes mellitus with diabetic polyneuropathy: Secondary | ICD-10-CM | POA: Diagnosis not present

## 2023-03-13 DIAGNOSIS — E11622 Type 2 diabetes mellitus with other skin ulcer: Secondary | ICD-10-CM | POA: Diagnosis not present

## 2023-03-13 DIAGNOSIS — N1831 Chronic kidney disease, stage 3a: Secondary | ICD-10-CM | POA: Diagnosis not present

## 2023-03-13 DIAGNOSIS — E11610 Type 2 diabetes mellitus with diabetic neuropathic arthropathy: Secondary | ICD-10-CM | POA: Diagnosis not present

## 2023-03-17 ENCOUNTER — Telehealth: Payer: Self-pay

## 2023-03-17 NOTE — Telephone Encounter (Signed)
PT with [facility] called to let us know that he was being D/C from HHPT stating that his Ortho Dr is changing him to out patient PT

## 2023-03-29 DIAGNOSIS — J449 Chronic obstructive pulmonary disease, unspecified: Secondary | ICD-10-CM | POA: Diagnosis not present

## 2023-03-29 DIAGNOSIS — I10 Essential (primary) hypertension: Secondary | ICD-10-CM | POA: Diagnosis not present

## 2023-03-29 DIAGNOSIS — E785 Hyperlipidemia, unspecified: Secondary | ICD-10-CM | POA: Diagnosis not present

## 2023-03-31 DIAGNOSIS — E1142 Type 2 diabetes mellitus with diabetic polyneuropathy: Secondary | ICD-10-CM | POA: Diagnosis not present

## 2023-03-31 DIAGNOSIS — M659 Synovitis and tenosynovitis, unspecified: Secondary | ICD-10-CM | POA: Diagnosis not present

## 2023-03-31 DIAGNOSIS — R6889 Other general symptoms and signs: Secondary | ICD-10-CM | POA: Diagnosis not present

## 2023-03-31 DIAGNOSIS — E11610 Type 2 diabetes mellitus with diabetic neuropathic arthropathy: Secondary | ICD-10-CM | POA: Diagnosis not present

## 2023-04-02 ENCOUNTER — Telehealth: Payer: Self-pay

## 2023-04-02 NOTE — Telephone Encounter (Signed)
_ Diabetes (DM) Review (HC) Chart Review A1C Reading #1 (last): 6.5 on: 10/14/2022 A1C Reading #2: 6.5 on: 06/19/2022 When was patient's last eye exam?: About 6 months ago  The patient's glycemic control has: Remained Stable What recent interventions have been made by any provider to improve the patient's conditions in the last 3 months?: Consults: 02/18/23 Ortho Surg Roel Cluck, PA-C For follow-up. No medication changes. Has there been any documented recent hospitalizations or ED visits since last visit with Clinical Lead?: No Is the patient currently on a STATIN medication?: Yes Is the patient currently on ACE/ARB medication?: Yes Adherence Review Does the [facility] have access to medication refill data?: Yes Adherence rates for STAR metric medications: Glipizide 5 mg - 03/02/23 90 DS Lisinopril 40 mg - 02/13/23 90 DS Metformin 500 mg - 03/02/23 90 DS Rosuvastatin 40 mg - 01/09/23 90 DS Adherence rates for medications indicated for disease state being reviewed: Metformin 500 mg - 03/02/23 90 DS Glipizide 5 mg - 03/02/23 90 DS Does the patient have >5 day gap between last estimated fill dates for any of the above medications?: No Disease State Questions Able to connect with the Patient?: Yes Are you currently checking your blood sugars?: Yes How often are you checking your blood sugar?: occasionally Recent fasting blood sugars: 150s-175s Is the patient having any lows <70?: No Is the patient having any fasting blood sugars above >170?: Yes What are the symptoms?: None . Is the patient checking their feet daily/regularly?: Yes Are there any cuts, swelling, blisters, redness, or any signs of infections?: No When was your last dentist appointment? (6 Month recommendation): Over 1 year  What diet changes have you made to improve your Blood Sugar level?: eating more home-cooked meals What exercise are you doing to improve your Blood Sugar level?: no formal exercise Misc.  Response/Information:: Patient right foot is still in a cast. . Patient stated he has been having headaches since he stopped sine he stop taking opioids and stated he called the office in regards of this, asking for a referral to a headache doctor but never heard back. I informed him that he would more then likely need to be seen and advised him to call the office to start the process and make himself appointment with his PCP. Engagement Notes Charlann Lange on 03/12/2023 03:06 PM [facility] Chart Review: 10 min 03/12/23  HC Assessment call time spent: 10 min 03/13/23   Clinical Lead Review  Adherence gaps identified?: No Drug Therapy Problems identified?: No Assessment: Controlled Jerral Ralph, PharmD  39mins

## 2023-04-07 DIAGNOSIS — E11610 Type 2 diabetes mellitus with diabetic neuropathic arthropathy: Secondary | ICD-10-CM | POA: Diagnosis not present

## 2023-04-07 DIAGNOSIS — Z9181 History of falling: Secondary | ICD-10-CM | POA: Diagnosis not present

## 2023-04-07 DIAGNOSIS — E1142 Type 2 diabetes mellitus with diabetic polyneuropathy: Secondary | ICD-10-CM | POA: Diagnosis not present

## 2023-04-07 DIAGNOSIS — R269 Unspecified abnormalities of gait and mobility: Secondary | ICD-10-CM | POA: Diagnosis not present

## 2023-04-10 ENCOUNTER — Other Ambulatory Visit: Payer: Self-pay | Admitting: Nurse Practitioner

## 2023-04-15 DIAGNOSIS — E11610 Type 2 diabetes mellitus with diabetic neuropathic arthropathy: Secondary | ICD-10-CM | POA: Diagnosis not present

## 2023-04-15 DIAGNOSIS — E1142 Type 2 diabetes mellitus with diabetic polyneuropathy: Secondary | ICD-10-CM | POA: Diagnosis not present

## 2023-04-15 DIAGNOSIS — R269 Unspecified abnormalities of gait and mobility: Secondary | ICD-10-CM | POA: Diagnosis not present

## 2023-04-15 DIAGNOSIS — Z9181 History of falling: Secondary | ICD-10-CM | POA: Diagnosis not present

## 2023-04-17 DIAGNOSIS — E11610 Type 2 diabetes mellitus with diabetic neuropathic arthropathy: Secondary | ICD-10-CM | POA: Diagnosis not present

## 2023-04-17 DIAGNOSIS — E1142 Type 2 diabetes mellitus with diabetic polyneuropathy: Secondary | ICD-10-CM | POA: Diagnosis not present

## 2023-04-17 DIAGNOSIS — Z9181 History of falling: Secondary | ICD-10-CM | POA: Diagnosis not present

## 2023-04-17 DIAGNOSIS — R269 Unspecified abnormalities of gait and mobility: Secondary | ICD-10-CM | POA: Diagnosis not present

## 2023-04-21 DIAGNOSIS — E11610 Type 2 diabetes mellitus with diabetic neuropathic arthropathy: Secondary | ICD-10-CM | POA: Diagnosis not present

## 2023-04-21 DIAGNOSIS — R269 Unspecified abnormalities of gait and mobility: Secondary | ICD-10-CM | POA: Diagnosis not present

## 2023-04-21 DIAGNOSIS — Z9181 History of falling: Secondary | ICD-10-CM | POA: Diagnosis not present

## 2023-04-21 DIAGNOSIS — E1142 Type 2 diabetes mellitus with diabetic polyneuropathy: Secondary | ICD-10-CM | POA: Diagnosis not present

## 2023-04-23 DIAGNOSIS — E11610 Type 2 diabetes mellitus with diabetic neuropathic arthropathy: Secondary | ICD-10-CM | POA: Diagnosis not present

## 2023-04-23 DIAGNOSIS — R269 Unspecified abnormalities of gait and mobility: Secondary | ICD-10-CM | POA: Diagnosis not present

## 2023-04-23 DIAGNOSIS — M1711 Unilateral primary osteoarthritis, right knee: Secondary | ICD-10-CM | POA: Diagnosis not present

## 2023-04-23 DIAGNOSIS — E1142 Type 2 diabetes mellitus with diabetic polyneuropathy: Secondary | ICD-10-CM | POA: Diagnosis not present

## 2023-04-23 DIAGNOSIS — G8929 Other chronic pain: Secondary | ICD-10-CM | POA: Diagnosis not present

## 2023-04-23 DIAGNOSIS — Z9181 History of falling: Secondary | ICD-10-CM | POA: Diagnosis not present

## 2023-04-23 DIAGNOSIS — M25562 Pain in left knee: Secondary | ICD-10-CM | POA: Diagnosis not present

## 2023-04-23 DIAGNOSIS — M7918 Myalgia, other site: Secondary | ICD-10-CM | POA: Diagnosis not present

## 2023-04-27 DIAGNOSIS — F330 Major depressive disorder, recurrent, mild: Secondary | ICD-10-CM | POA: Diagnosis not present

## 2023-04-27 DIAGNOSIS — F411 Generalized anxiety disorder: Secondary | ICD-10-CM | POA: Diagnosis not present

## 2023-05-05 DIAGNOSIS — Z9181 History of falling: Secondary | ICD-10-CM | POA: Diagnosis not present

## 2023-05-05 DIAGNOSIS — R269 Unspecified abnormalities of gait and mobility: Secondary | ICD-10-CM | POA: Diagnosis not present

## 2023-05-05 DIAGNOSIS — E11610 Type 2 diabetes mellitus with diabetic neuropathic arthropathy: Secondary | ICD-10-CM | POA: Diagnosis not present

## 2023-05-05 DIAGNOSIS — E1142 Type 2 diabetes mellitus with diabetic polyneuropathy: Secondary | ICD-10-CM | POA: Diagnosis not present

## 2023-05-07 DIAGNOSIS — R269 Unspecified abnormalities of gait and mobility: Secondary | ICD-10-CM | POA: Diagnosis not present

## 2023-05-07 DIAGNOSIS — E1142 Type 2 diabetes mellitus with diabetic polyneuropathy: Secondary | ICD-10-CM | POA: Diagnosis not present

## 2023-05-07 DIAGNOSIS — E11610 Type 2 diabetes mellitus with diabetic neuropathic arthropathy: Secondary | ICD-10-CM | POA: Diagnosis not present

## 2023-05-07 DIAGNOSIS — Z9181 History of falling: Secondary | ICD-10-CM | POA: Diagnosis not present

## 2023-05-09 ENCOUNTER — Other Ambulatory Visit: Payer: Self-pay | Admitting: Nurse Practitioner

## 2023-05-12 DIAGNOSIS — R269 Unspecified abnormalities of gait and mobility: Secondary | ICD-10-CM | POA: Diagnosis not present

## 2023-05-12 DIAGNOSIS — E1142 Type 2 diabetes mellitus with diabetic polyneuropathy: Secondary | ICD-10-CM | POA: Diagnosis not present

## 2023-05-12 DIAGNOSIS — Z9181 History of falling: Secondary | ICD-10-CM | POA: Diagnosis not present

## 2023-05-12 DIAGNOSIS — E11610 Type 2 diabetes mellitus with diabetic neuropathic arthropathy: Secondary | ICD-10-CM | POA: Diagnosis not present

## 2023-05-14 DIAGNOSIS — R269 Unspecified abnormalities of gait and mobility: Secondary | ICD-10-CM | POA: Diagnosis not present

## 2023-05-14 DIAGNOSIS — Z9181 History of falling: Secondary | ICD-10-CM | POA: Diagnosis not present

## 2023-05-14 DIAGNOSIS — E11610 Type 2 diabetes mellitus with diabetic neuropathic arthropathy: Secondary | ICD-10-CM | POA: Diagnosis not present

## 2023-05-14 DIAGNOSIS — E1142 Type 2 diabetes mellitus with diabetic polyneuropathy: Secondary | ICD-10-CM | POA: Diagnosis not present

## 2023-05-18 DIAGNOSIS — R269 Unspecified abnormalities of gait and mobility: Secondary | ICD-10-CM | POA: Diagnosis not present

## 2023-05-18 DIAGNOSIS — E11610 Type 2 diabetes mellitus with diabetic neuropathic arthropathy: Secondary | ICD-10-CM | POA: Diagnosis not present

## 2023-05-18 DIAGNOSIS — Z9181 History of falling: Secondary | ICD-10-CM | POA: Diagnosis not present

## 2023-05-18 DIAGNOSIS — E1142 Type 2 diabetes mellitus with diabetic polyneuropathy: Secondary | ICD-10-CM | POA: Diagnosis not present

## 2023-05-19 DIAGNOSIS — E11610 Type 2 diabetes mellitus with diabetic neuropathic arthropathy: Secondary | ICD-10-CM | POA: Diagnosis not present

## 2023-05-19 DIAGNOSIS — E1142 Type 2 diabetes mellitus with diabetic polyneuropathy: Secondary | ICD-10-CM | POA: Diagnosis not present

## 2023-05-21 DIAGNOSIS — R269 Unspecified abnormalities of gait and mobility: Secondary | ICD-10-CM | POA: Diagnosis not present

## 2023-05-21 DIAGNOSIS — E1142 Type 2 diabetes mellitus with diabetic polyneuropathy: Secondary | ICD-10-CM | POA: Diagnosis not present

## 2023-05-21 DIAGNOSIS — Z9181 History of falling: Secondary | ICD-10-CM | POA: Diagnosis not present

## 2023-05-21 DIAGNOSIS — E11610 Type 2 diabetes mellitus with diabetic neuropathic arthropathy: Secondary | ICD-10-CM | POA: Diagnosis not present

## 2023-06-11 DIAGNOSIS — M25561 Pain in right knee: Secondary | ICD-10-CM | POA: Diagnosis not present

## 2023-06-11 DIAGNOSIS — G8929 Other chronic pain: Secondary | ICD-10-CM | POA: Diagnosis not present

## 2023-06-12 ENCOUNTER — Other Ambulatory Visit: Payer: Self-pay | Admitting: Nurse Practitioner

## 2023-06-22 ENCOUNTER — Encounter: Payer: Self-pay | Admitting: Internal Medicine

## 2023-06-22 ENCOUNTER — Ambulatory Visit (INDEPENDENT_AMBULATORY_CARE_PROVIDER_SITE_OTHER): Payer: Medicare HMO | Admitting: Internal Medicine

## 2023-06-22 ENCOUNTER — Ambulatory Visit: Payer: Medicare HMO | Admitting: Nurse Practitioner

## 2023-06-22 VITALS — BP 180/100 | HR 107 | Ht 72.0 in | Wt 212.4 lb

## 2023-06-22 DIAGNOSIS — I129 Hypertensive chronic kidney disease with stage 1 through stage 4 chronic kidney disease, or unspecified chronic kidney disease: Secondary | ICD-10-CM

## 2023-06-22 DIAGNOSIS — E782 Mixed hyperlipidemia: Secondary | ICD-10-CM

## 2023-06-22 DIAGNOSIS — I10 Essential (primary) hypertension: Secondary | ICD-10-CM

## 2023-06-22 DIAGNOSIS — E1169 Type 2 diabetes mellitus with other specified complication: Secondary | ICD-10-CM

## 2023-06-22 LAB — POCT CBG (FASTING - GLUCOSE)-MANUAL ENTRY: Glucose Fasting, POC: 140 mg/dL — AB (ref 70–99)

## 2023-06-22 MED ORDER — AMLODIPINE BESYLATE 5 MG PO TABS
5.0000 mg | ORAL_TABLET | Freq: Every day | ORAL | 11 refills | Status: DC
Start: 2023-06-22 — End: 2024-09-15

## 2023-06-22 NOTE — Progress Notes (Signed)
Established Patient Office Visit  Subjective:  Patient ID: Chris Mercer, male    DOB: 1946-11-02  Age: 77 y.o. MRN: [id number]  Chief Complaint  Patient presents with   Acute Visit    Headaches and BP been high.    Patient in office complaining of elevated blood pressure, headaches. Patient reports his BP has been high especially since his right ankle injury and pain from it. His eye glass prescription is upto date. Denies chest pain or shortness of breath. No dizziness or nausea with headaches. Takes his Lisinopril reguarly but has not taken Crestor in a while. Needs labs today. Will add Norvasc 5 mg for now.    No other concerns at this time.   Past Medical History:  Diagnosis Date   Anxiety    Aortic atherosclerosis (HCC)    CAD (coronary artery disease)    a.) MPI 04/16/2021 --> LVEF 71%. Large severe perferion defect in the apex, apicolateral, mid inferolateral, and basal inferolateral location; no RWMAs; ischemia in LCx territory. b.) CCTA 04/25/2021 --> CCS/Agatston score 684.7.   Chronic pain syndrome    CKD (chronic kidney disease), stage III (HCC)    COPD (chronic obstructive pulmonary disease) (HCC)    Depression    Diabetic neuropathy (HCC)    GERD (gastroesophageal reflux disease)    Hepatic steatosis    Hiatal hernia    History of heart artery stent 2002   a.) type and location unknown   HLD (hyperlipidemia)    Hypertension    LAFB (left anterior fascicular block)    LVH (left ventricular hypertrophy)    OSA (obstructive sleep apnea)    a.) PSG 12/14/2019 --> AHI 6. b.) cannot tolerate nocturnal PAP therapy d/t COPD and significant GERD.   Osteoarthritis    Right middle lobe pulmonary nodule 03/26/2019   a.) CT 03/25/2021 - measured 2.5 mm.   T2DM (type 2 diabetes mellitus) (HCC)    Umbilical hernia    s/p repair    Past Surgical History:  Procedure Laterality Date   AMPUTATION TOE     APPENDECTOMY     CORONARY ANGIOPLASTY WITH STENT PLACEMENT      ESOPHAGOGASTRODUODENOSCOPY (EGD) WITH PROPOFOL N/A 09/05/2021   Procedure: ESOPHAGOGASTRODUODENOSCOPY (EGD) WITH PROPOFOL;  Surgeon: Jaynie Collins, DO;  Location[facility] ENDOSCOPY;  Service: Endoscopy;  Laterality: N/A;   HERNIA REPAIR     x3   INSERTION OF MESH  09/26/2021   Procedure: INSERTION OF MESH;  Surgeon: Leafy Ro, MD;  Location: [facility] ORS;  Service: General;;   XI ROBOTIC ASSISTED PARAESOPHAGEAL HERNIA REPAIR N/A 09/26/2021   Procedure: XI ROBOTIC ASSISTED PARAESOPHAGEAL HERNIA REPAIR, RNFA to assist;  Surgeon: Leafy Ro, MD;  Location: [facility] ORS;  Service: General;  Laterality: N/A;    Social History   Socioeconomic History   Marital status: Single    Spouse name: Not on file   Number of children: Not on file   Years of education: Not on file   Highest education level: Not on file  Occupational History   Not on file  Tobacco Use   Smoking status: Former    Types: Cigarettes    Quit date: 1990    Years since quitting: 34.5   Smokeless tobacco: Never  Vaping Use   Vaping Use: Never used  Substance and Sexual Activity   Alcohol use: Not Currently   Drug use: Not Currently   Sexual activity: Not on file  Other Topics Concern   Not on  file  Social History Narrative   Retired and lives alone.    Social Determinants of Health   Financial Resource Strain: Not on file  Food Insecurity: No Food Insecurity (09/09/2022)   Hunger Vital Sign    Worried About Running Out of Food in the Last Year: Never true    Ran Out of Food in the Last Year: Never true  Transportation Needs: No Transportation Needs (09/09/2022)   PRAPARE - Administrator, Civil Service (Medical): No    Lack of Transportation (Non-Medical): No  Physical Activity: Not on file  Stress: Not on file  Social Connections: Not on file  Intimate Partner Violence: Not on file    Family History  Problem Relation Age of Onset   Heart disease Father     Allergies  Allergen  Reactions   Azithromycin Anaphylaxis, Swelling and Hives   Fenoprofen Anaphylaxis   Duloxetine Diarrhea    With dark stools       Review of Systems  Constitutional:  Positive for malaise/fatigue and weight loss. Negative for chills and fever.  HENT: Negative.    Eyes: Negative.   Respiratory: Negative.  Negative for cough, shortness of breath and wheezing.   Cardiovascular: Negative.  Negative for chest pain, palpitations, leg swelling and PND.  Gastrointestinal: Negative.  Negative for abdominal pain, constipation, diarrhea, heartburn, nausea and vomiting.  Genitourinary: Negative.  Negative for dysuria and urgency.  Musculoskeletal:  Positive for joint pain. Negative for falls, myalgias and neck pain.  Skin: Negative.   Neurological:  Positive for headaches. Negative for dizziness, sensory change and weakness.  Endo/Heme/Allergies: Negative.   Psychiatric/Behavioral: Negative.  Negative for depression. The patient is not nervous/anxious.        Objective:   BP (!) 180/100   Pulse (!) 107   Ht 6' (1.829 m)   Wt 212 lb 6.4 oz (96.3 kg)   SpO2 96%   BMI 28.81 kg/m   Vitals:   06/22/23 1529  BP: (!) 180/100  Pulse: (!) 107  Height: 6' (1.829 m)  Weight: 212 lb 6.4 oz (96.3 kg)  SpO2: 96%  BMI (Calculated): 28.8    Physical Exam Vitals and nursing note reviewed.  Constitutional:      General: He is not in acute distress.    Appearance: Normal appearance.  HENT:     Head: Normocephalic and atraumatic.     Right Ear: Tympanic membrane normal.     Left Ear: Tympanic membrane normal.     Nose: Nose normal.     Mouth/Throat:     Mouth: Mucous membranes are moist.     Pharynx: No oropharyngeal exudate.  Eyes:     Conjunctiva/sclera: Conjunctivae normal.  Cardiovascular:     Rate and Rhythm: Normal rate and regular rhythm.     Pulses: Normal pulses.     Heart sounds: Murmur heard.  Pulmonary:     Effort: Pulmonary effort is normal.     Breath sounds: Normal  breath sounds. No wheezing, rhonchi or rales.  Abdominal:     Palpations: Abdomen is soft. There is no mass.     Tenderness: There is no right CVA tenderness, left CVA tenderness or guarding.  Musculoskeletal:        General: Normal range of motion.     Cervical back: Normal range of motion.  Lymphadenopathy:     Cervical: No cervical adenopathy.  Skin:    General: Skin is warm and dry.  Neurological:  General: No focal deficit present.     Mental Status: He is alert and oriented to person, place, and time.  Psychiatric:        Mood and Affect: Mood normal.        Behavior: Behavior normal.      Results for orders placed or performed in visit on 06/22/23  POCT CBG (Fasting - Glucose)  Result Value Ref Range   Glucose Fasting, POC 140 (A) 70 - 99 mg/dL    Recent Results (from the past 2160 hour(s))  POCT CBG (Fasting - Glucose)     Status: Abnormal   Collection Time: 06/22/23  3:40 PM  Result Value Ref Range   Glucose Fasting, POC 140 (A) 70 - 99 mg/dL      Assessment & Plan:  Check labs. Add Norvasc. Monitor BP. Will adjust further at follow up. Problem List Items Addressed This Visit     Essential hypertension - Primary   Relevant Medications   amLODipine (NORVASC) 5 MG tablet   Other Relevant Orders   CMP14+EGFR   Type 2 diabetes mellitus (HCC)   Relevant Orders   POCT CBG (Fasting - Glucose) (Completed)   Hemoglobin A1c   Chronic kidney disease due to hypertension   Relevant Orders   CBC With Differential   Hyperlipidemia   Relevant Medications   amLODipine (NORVASC) 5 MG tablet   Other Relevant Orders   Lipid Panel w/o Chol/HDL Ratio    Return in about 1 week (around 06/29/2023).   Total time spent: 30 minutes  Margaretann Loveless, MD  06/22/2023   This document may have been prepared by [facility] Voice Recognition software and as such may include unintentional dictation errors.

## 2023-06-23 LAB — CMP14+EGFR
ALT: 15 IU/L (ref 0–44)
AST: 18 IU/L (ref 0–40)
Albumin: 4.4 g/dL (ref 3.8–4.8)
Alkaline Phosphatase: 141 IU/L — ABNORMAL HIGH (ref 44–121)
BUN/Creatinine Ratio: 14 (ref 10–24)
BUN: 18 mg/dL (ref 8–27)
Bilirubin Total: 0.2 mg/dL (ref 0.0–1.2)
CO2: 20 mmol/L (ref 20–29)
Calcium: 10.2 mg/dL (ref 8.6–10.2)
Chloride: 106 mmol/L (ref 96–106)
Creatinine, Ser: 1.25 mg/dL (ref 0.76–1.27)
Globulin, Total: 2.5 g/dL (ref 1.5–4.5)
Glucose: 133 mg/dL — ABNORMAL HIGH (ref 70–99)
Potassium: 4.5 mmol/L (ref 3.5–5.2)
Sodium: 141 mmol/L (ref 134–144)
Total Protein: 6.9 g/dL (ref 6.0–8.5)
eGFR: 60 mL/min/{1.73_m2} (ref >59)

## 2023-06-23 LAB — CBC WITH DIFFERENTIAL
Basophils Absolute: 0 10*3/uL (ref 0.0–0.2)
Basos: 1 %
EOS (ABSOLUTE): 0.2 10*3/uL (ref 0.0–0.4)
Eos: 3 %
Hematocrit: 43.6 % (ref 37.5–51.0)
Hemoglobin: 14.9 g/dL (ref 13.0–17.7)
Immature Grans (Abs): 0 10*3/uL (ref 0.0–0.1)
Immature Granulocytes: 0 %
Lymphocytes Absolute: 1.4 10*3/uL (ref 0.7–3.1)
Lymphs: 19 %
MCH: 31.7 pg (ref 26.6–33.0)
MCHC: 34.2 g/dL (ref 31.5–35.7)
MCV: 93 fL (ref 79–97)
Monocytes Absolute: 0.4 10*3/uL (ref 0.1–0.9)
Monocytes: 6 %
Neutrophils Absolute: 5 10*3/uL (ref 1.4–7.0)
Neutrophils: 71 %
RBC: 4.7 x10E6/uL (ref 4.14–5.80)
RDW: 13.6 % (ref 11.6–15.4)
WBC: 7 10*3/uL (ref 3.4–10.8)

## 2023-06-23 LAB — HEMOGLOBIN A1C
Est. average glucose Bld gHb Est-mCnc: 126 mg/dL
Hgb A1c MFr Bld: 6 % — ABNORMAL HIGH (ref 4.8–5.6)

## 2023-06-23 LAB — LIPID PANEL W/O CHOL/HDL RATIO
Cholesterol, Total: 213 mg/dL — ABNORMAL HIGH (ref 100–199)
HDL: 44 mg/dL (ref >39)
LDL Chol Calc (NIH): 131 mg/dL — ABNORMAL HIGH (ref 0–99)
Triglycerides: 212 mg/dL — ABNORMAL HIGH (ref 0–149)
VLDL Cholesterol Cal: 38 mg/dL (ref 5–40)

## 2023-07-01 ENCOUNTER — Other Ambulatory Visit: Payer: Self-pay | Admitting: Nurse Practitioner

## 2023-07-10 ENCOUNTER — Ambulatory Visit: Payer: Medicare HMO | Admitting: Cardiology

## 2023-07-13 ENCOUNTER — Telehealth: Payer: Self-pay

## 2023-07-31 ENCOUNTER — Other Ambulatory Visit: Payer: Self-pay

## 2023-09-03 ENCOUNTER — Other Ambulatory Visit: Payer: Self-pay | Admitting: Nurse Practitioner

## 2023-09-03 MED ORDER — GABAPENTIN 600 MG PO TABS: 1200.0000 mg | ORAL_TABLET | Freq: Three times a day (TID) | ORAL | 0 refills | Status: DC

## 2023-09-17 ENCOUNTER — Other Ambulatory Visit: Payer: Self-pay | Admitting: Nurse Practitioner

## 2023-09-17 MED ORDER — DOXEPIN HCL 150 MG PO CAPS: 150.0000 mg | ORAL_CAPSULE | Freq: Every day | ORAL | 1 refills | Status: DC

## 2023-12-17 ENCOUNTER — Other Ambulatory Visit: Payer: Self-pay

## 2023-12-18 MED ORDER — METFORMIN HCL 500 MG PO TABS: 500.0000 mg | ORAL_TABLET | Freq: Two times a day (BID) | ORAL | 1 refills | Status: AC

## 2024-02-09 ENCOUNTER — Other Ambulatory Visit: Payer: Self-pay | Admitting: Cardiology

## 2024-03-15 ENCOUNTER — Telehealth: Payer: Self-pay | Admitting: Internal Medicine

## 2024-03-15 NOTE — Telephone Encounter (Signed)
 Left VM that patient had a fall and was d/c from the hospital needing home health. Would like verbal PT for home health in the [facility] area.   He used to be a [facility] patient and seen Dr. Welton Flakes in June 2024. Okay for these orders?

## 2024-04-06 ENCOUNTER — Other Ambulatory Visit (INDEPENDENT_AMBULATORY_CARE_PROVIDER_SITE_OTHER): Payer: Self-pay

## 2024-04-06 ENCOUNTER — Ambulatory Visit: Admitting: Orthopaedic Surgery

## 2024-04-06 DIAGNOSIS — M25571 Pain in right ankle and joints of right foot: Secondary | ICD-10-CM

## 2024-04-06 NOTE — Progress Notes (Signed)
 Office Visit Note   Patient: Chris Mercer           Date of Birth: November 04, 1946           MRN: [id number] Visit Date: 04/06/2024              Requested by: Chris Prey, NP [address] [place],  [place] [address] PCP: Chris Eva, NP   Assessment & Plan: Visit Diagnoses:  1. Pain in right ankle and joints of right foot     Plan: Chris Mercer is a 78 year old gentleman with posttraumatic arthritis of the right ankle.  May have Charcot arthropathy as well.  There is a fair amount of valgus tilt to the talus likely insufficiency of the deltoid ligament.  He has bone-on-bone changes of the tibiotalar joint.  Because bracing has not helped he would like to explore surgical options which will likely be an ankle fusion.  I will make a referral for him to see Dr. Susa Simmonds or Victorino Dike.  Follow-Up Instructions: No follow-ups on file.   Orders:  Orders Placed This Encounter  Procedures   XR Ankle Complete Right   Ambulatory referral to Orthopedic Surgery   No orders of the defined types were placed in this encounter.     Procedures: No procedures performed   Clinical Data: No additional findings.   Subjective: Chief Complaint  Patient presents with   Right Ankle - Pain    HPI [name] is a 79 year old gentleman here for evaluation of chronic right ankle pain.  Suffered an ankle injury in October 2023 at home.  Has peripheral neuropathy which caused him to fall and twist his ankle.  He laid on the floor for a day and a half and was finally able to call EMS.  He was admitted to the hospital for management of rhabdomyolysis, acute kidney injury, anemia, hypertension and then was ultimately discharged to a rehab facility.  He was eventually discharged home and has done physical therapy at home which has not helped.  He has been wearing a supportive brace but he feels that this is not helping.  He is here for second opinion.  Review of Systems  Constitutional: Negative.   HENT:  Negative.    Eyes: Negative.   Respiratory: Negative.    Cardiovascular: Negative.   Gastrointestinal: Negative.   Endocrine: Negative.   Genitourinary: Negative.   Skin: Negative.   Allergic/Immunologic: Negative.   Neurological: Negative.   Hematological: Negative.   Psychiatric/Behavioral: Negative.    All other systems reviewed and are negative.    Objective: Vital Signs: There were no vitals taken for this visit.  Physical Exam Vitals and nursing note reviewed.  Constitutional:      Appearance: He is well-developed.  HENT:     Head: Normocephalic and atraumatic.  Eyes:     Pupils: Pupils are equal, round, and reactive to light.  Pulmonary:     Effort: Pulmonary effort is normal.  Abdominal:     Palpations: Abdomen is soft.  Musculoskeletal:        General: Normal range of motion.     Cervical back: Neck supple.  Skin:    General: Skin is warm.  Neurological:     Mental Status: He is alert and oriented to person, place, and time.  Psychiatric:        Behavior: Behavior normal.        Thought Content: Thought content normal.        Judgment: Judgment normal.  Ortho Exam Examination of the right foot shows hindfoot valgus.  He does not have any wounds.  He is status post partial great toe amputation with a fully healed surgical scar.  His ankle collapses into valgus upon standing.  He has crepitus with ankle range of motion. Specialty Comments:  No specialty comments available.  Imaging: XR Ankle Complete Right Result Date: 04/06/2024 X-rays of the right ankle show posttraumatic calcification of the syndesmosis.  There is posttraumatic arthritis of the ankle joint with valgus tilt of the talus.  The lateral half of the tibial plafond is ununited.  There is widening syndesmosis and ankle mortise.    PMFS History: Patient Active Problem List   Diagnosis Date Noted   S/P repair of paraesophageal hernia 09/26/2021   Difficulty walking 06/05/2021   Tremor  06/05/2021   Edema of lower extremity 02/11/2021   Stage 3a chronic kidney disease (HCC) 02/11/2021   Acute renal failure syndrome (HCC) 07/11/2020   Chronic kidney disease due to hypertension 07/11/2020   Hyperlipidemia 03/26/2019   Pressure injury of skin 03/23/2019   Medication-induced delirium, acute, mixed level of activity 03/21/2019   Depression 03/21/2019   Anxiety 03/21/2019   Flu-like symptoms    Generalized weakness    Altered mental status 03/18/2019   Neuropathy 03/18/2019   Essential hypertension 03/18/2019   Type 2 diabetes mellitus (HCC) 03/18/2019   Chronic pain syndrome 03/18/2019   Encounter for general adult medical examination without abnormal findings 04/29/2018   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 Large severe perferion defect in the apex, apicolateral, mid inferolateral, and basal inferolateral location; no RWMAs; ischemia in LCx territory. b.) CCTA 04/25/2021 --> CCS/Agatston score 684.7.   Chronic pain syndrome    CKD (chronic kidney disease), stage III (HCC)    COPD (chronic obstructive pulmonary disease) (HCC)    Depression    Diabetic neuropathy (HCC)    GERD (gastroesophageal reflux disease)    Hepatic steatosis    Hiatal hernia    History of heart artery stent 2002   a.) type and location unknown   HLD (hyperlipidemia)    Hypertension    LAFB (left anterior fascicular block)    LVH (left ventricular hypertrophy)    OSA (obstructive sleep apnea)    a.) PSG 12/14/2019 --> AHI 6. b.) cannot tolerate nocturnal PAP therapy d/t COPD and significant GERD.   Osteoarthritis    Right middle lobe pulmonary nodule 03/26/2019   a.) CT 03/25/2021 - measured 2.5 mm.   T2DM (type 2 diabetes mellitus) (HCC)    Umbilical hernia    s/p repair    Family History   Problem Relation Age of Onset   Heart disease Father     Past Surgical History:  Procedure Laterality Date   AMPUTATION TOE     APPENDECTOMY     CORONARY ANGIOPLASTY WITH STENT PLACEMENT     ESOPHAGOGASTRODUODENOSCOPY (EGD) WITH PROPOFOL N/A 09/05/2021   Procedure: ESOPHAGOGASTRODUODENOSCOPY (EGD) WITH PROPOFOL;  Surgeon: Jaynie Collins, DO;  [facility] ENDOSCOPY;  Service: Endoscopy;  Laterality: N/A;   HERNIA REPAIR     x3   INSERTION OF MESH  09/26/2021   Procedure: INSERTION OF MESH;  Surgeon: Leafy Ro, MD;  Location: [facility] ORS;  Service: General;;  XI ROBOTIC ASSISTED PARAESOPHAGEAL HERNIA REPAIR N/A 09/26/2021   Procedure: XI ROBOTIC ASSISTED PARAESOPHAGEAL HERNIA REPAIR, RNFA to assist;  Surgeon: Leafy Ro, MD;  Location: [facility] ORS;  Service: General;  Laterality: N/A;   Social History   Occupational History   Not on file  Tobacco Use   Smoking status: Former    Current packs/day: 0.00    Types: Cigarettes    Quit date: 1990    Years since quitting: 35.2   Smokeless tobacco: Never  Vaping Use   Vaping status: Never Used  Substance and Sexual Activity   Alcohol use: Not Currently   Drug use: Not Currently   Sexual activity: Not on file

## 2024-04-21 ENCOUNTER — Other Ambulatory Visit: Payer: Self-pay | Admitting: Internal Medicine

## 2024-04-21 DIAGNOSIS — I10 Essential (primary) hypertension: Secondary | ICD-10-CM

## 2024-05-04 ENCOUNTER — Other Ambulatory Visit: Payer: Self-pay

## 2024-05-04 DIAGNOSIS — I70213 Atherosclerosis of native arteries of extremities with intermittent claudication, bilateral legs: Secondary | ICD-10-CM

## 2024-05-08 ENCOUNTER — Other Ambulatory Visit: Payer: Self-pay | Admitting: Cardiology

## 2024-05-12 ENCOUNTER — Ambulatory Visit (HOSPITAL_COMMUNITY)
Admission: RE | Admit: 2024-05-12 | Discharge: 2024-05-12 | Disposition: A | Discharge status: Home / Self Care | Source: Ambulatory Visit | Attending: Vascular Surgery | Admitting: Vascular Surgery

## 2024-05-12 DIAGNOSIS — I70213 Atherosclerosis of native arteries of extremities with intermittent claudication, bilateral legs: Secondary | ICD-10-CM | POA: Diagnosis present

## 2024-05-12 LAB — VAS US ABI WITH/WO TBI
Left ABI: 0.98
Right ABI: 0.99

## 2024-05-25 NOTE — Progress Notes (Deleted)
 Office Note     CC:  PAD with history of toe amputation Requesting Provider:  Donnamarie Gables, MD  HPI: Chris Mercer is a 78 y.o. ([birth year]) male presenting at the request of .Glendale Landmark, NP ***  The pt is *** on a statin for cholesterol management.  The pt is *** on a daily aspirin.   Other AC:  *** The pt is *** on medication for hypertension.   The pt is *** diabetic.  Tobacco hx:  ***  Past Medical History:  Diagnosis Date   Anxiety    Aortic atherosclerosis (HCC)    CAD (coronary artery disease)    a.) MPI 04/16/2021 --> LVEF 71%. Large severe perferion defect in the apex, apicolateral, mid inferolateral, and basal inferolateral location; no RWMAs; ischemia in LCx territory. b.) CCTA 04/25/2021 --> CCS/Agatston score 684.7.   Chronic pain syndrome    CKD (chronic kidney disease), stage III (HCC)    COPD (chronic obstructive pulmonary disease) (HCC)    Depression    Diabetic neuropathy (HCC)    GERD (gastroesophageal reflux disease)    Hepatic steatosis    Hiatal hernia    History of heart artery stent 2002   a.) type and location unknown   HLD (hyperlipidemia)    Hypertension    LAFB (left anterior fascicular block)    LVH (left ventricular hypertrophy)    OSA (obstructive sleep apnea)    a.) PSG 12/14/2019 --> AHI 6. b.) cannot tolerate nocturnal PAP therapy d/t COPD and significant GERD.   Osteoarthritis    Right middle lobe pulmonary nodule 03/26/2019   a.) CT 03/25/2021 - measured 2.5 mm.   T2DM (type 2 diabetes mellitus) (HCC)    Umbilical hernia    s/p repair    Past Surgical History:  Procedure Laterality Date   AMPUTATION TOE     APPENDECTOMY     CORONARY ANGIOPLASTY WITH STENT PLACEMENT     ESOPHAGOGASTRODUODENOSCOPY (EGD) WITH PROPOFOL  N/A 09/05/2021   Procedure: ESOPHAGOGASTRODUODENOSCOPY (EGD) WITH PROPOFOL ;  Surgeon: Quintin Buckle, DO;  Location: [facility];  Service: Endoscopy;  Laterality: N/A;   HERNIA REPAIR     x3    INSERTION OF MESH  09/26/2021   Procedure: INSERTION OF MESH;  Surgeon: Alben Alma, MD;  Location: [facility] OR[facility];  Service: General;;   XI ROBOTIC ASSISTED PARAESOPHAGEAL HERNIA REPAIR N/A 09/26/2021   Procedure: XI ROBOTIC ASSISTED PARAESOPHAGEAL HERNIA REPAIR, RNFA to assist;  Surgeon: Alben Alma, MD;  Location: [facility] OR[facility];  Service: General;  Laterality: N/A;    Social History   Socioeconomic History   Marital status: Single    Spouse name: Not on file   Number of children: Not on file   Years of education: Not on file   Highest education level: Not on file  Occupational History   Not on file  Tobacco Use   Smoking status: Former    Current packs/day: 0.00    Types: Cigarettes    Quit date: 1990    Years since quitting: 35.4   Smokeless tobacco: Never  Vaping Use   Vaping status: Never Used  Substance and Sexual Activity   Alcohol use: Not Currently   Drug use: Not Currently   Sexual activity: Not on file  Other Topics Concern   Not on file  Social History Narrative   Retired and lives alone.    Social Drivers of Corporate investment banker Strain: Not on file  Food Insecurity: Low Risk  (  04/26/2024)   Received from [facility]   Hunger Vital Sign    Worried About Running Out of Food in the Last Year: Never true    Ran Out of Food in the Last Year: Never true  Transportation Needs: No Transportation Needs (04/26/2024)   Received from [facility]    In the past 12 months, has lack of reliable transportation kept you from medical appointments, meetings, work or from getting things needed for daily living? : No  Physical Activity: Not on file  Stress: Not on file  Social Connections: Not on file  Intimate Partner Violence: Not on file   *** Family History  Problem Relation Age of Onset   Heart disease Father     Current Outpatient Medications  Medication Sig Dispense Refill   amLODipine  (NORVASC ) 5 MG tablet Take 1 tablet (5 mg total) by  mouth daily. 30 tablet 11   busPIRone  (BUSPAR ) 30 MG tablet Take 30 mg by mouth 2 (two) times daily.     doxepin  (SINEQUAN ) 150 MG capsule TAKE 1 CAPSULE BY MOUTH AT BEDTIME 90 capsule 0   escitalopram  (LEXAPRO ) 20 MG tablet Take 20 mg by mouth daily.     gabapentin  (NEURONTIN ) 600 MG tablet Take 2 tablets (1,200 mg total) by mouth 3 (three) times daily. 180 tablet 0   glipiZIDE (GLUCOTROL XL) 5 MG 24 hr tablet Take 5 mg by mouth daily with breakfast.     hydrOXYzine (ATARAX/VISTARIL) 25 MG tablet Take 25 mg by mouth daily.     lisinopril  (PRINIVIL ,ZESTRIL ) 40 MG tablet Take 40 mg by mouth daily.     metFORMIN  (GLUCOPHAGE ) 500 MG tablet Take 1 tablet (500 mg total) by mouth 2 (two) times daily with a meal. 180 tablet 1   rosuvastatin (CRESTOR) 40 MG tablet Take 40 mg by mouth daily. (Patient not taking: Reported on 06/22/2023)     No current facility-administered medications for this visit.    Allergies  Allergen Reactions   Azithromycin Anaphylaxis, Swelling and Hives   Fenoprofen Anaphylaxis   Duloxetine Diarrhea    With dark stools        REVIEW OF SYSTEMS:  *** [X]  denotes positive finding, [ ]  denotes negative finding Cardiac  Comments:  Chest pain or chest pressure:    Shortness of breath upon exertion:    Short of breath when lying flat:    Irregular heart rhythm:        Vascular    Pain in calf, thigh, or hip brought on by ambulation:    Pain in feet at night that wakes you up from your sleep:     Blood clot in your veins:    Leg swelling:         Pulmonary    Oxygen  at home:    Productive cough:     Wheezing:         Neurologic    Sudden weakness in arms or legs:     Sudden numbness in arms or legs:     Sudden onset of difficulty speaking or slurred speech:    Temporary loss of vision in one eye:     Problems with dizziness:         Gastrointestinal    Blood in stool:     Vomited blood:         Genitourinary    Burning when urinating:     Blood in  urine:        Psychiatric  Major depression:         Hematologic    Bleeding problems:    Problems with blood clotting too easily:        Skin    Rashes or ulcers:        Constitutional    Fever or chills:      PHYSICAL EXAMINATION:  There were no vitals filed for this visit.  General:  WDWN in NAD; vital signs documented above Gait: Not observed HENT: WNL, normocephalic Pulmonary: normal non-labored breathing , without wheezing Cardiac: {Desc; regular/irreg:14544} HR Abdomen: soft, NT, no masses Skin: {With/Without:20273} rashes Vascular Exam/Pulses:  Right Left  Radial {Exam; arterial pulse strength 0-4:30167} {Exam; arterial pulse strength 0-4:30167}  Ulnar {Exam; arterial pulse strength 0-4:30167} {Exam; arterial pulse strength 0-4:30167}  Femoral {Exam; arterial pulse strength 0-4:30167} {Exam; arterial pulse strength 0-4:30167}  Popliteal {Exam; arterial pulse strength 0-4:30167} {Exam; arterial pulse strength 0-4:30167}  DP {Exam; arterial pulse strength 0-4:30167} {Exam; arterial pulse strength 0-4:30167}  PT {Exam; arterial pulse strength 0-4:30167} {Exam; arterial pulse strength 0-4:30167}   Extremities: {With/Without:20273} ischemic changes, {With/Without:20273} Gangrene , {With/Without:20273} cellulitis; {With/Without:20273} open wounds;  Musculoskeletal: no muscle wasting or atrophy  Neurologic: A&O X 3;  No focal weakness or paresthesias are detected Psychiatric:  The pt has {Desc; normal/abnormal:11317::"Normal"} affect.   Non-Invasive Vascular Imaging:   ABI Findings:  +---------+------------------+-----+-----------+----------+  Right   Rt Pressure (mmHg)IndexWaveform   Comment     +---------+------------------+-----+-----------+----------+  Brachial 148                                           +---------+------------------+-----+-----------+----------+  PTA     146               0.99 multiphasic             +---------+------------------+-----+-----------+----------+  DP      126               0.85 multiphasic            +---------+------------------+-----+-----------+----------+  Great Toe                                  amputation  +---------+------------------+-----+-----------+----------+   +---------+------------------+-----+-----------+-------+  Left    Lt Pressure (mmHg)IndexWaveform   Comment  +---------+------------------+-----+-----------+-------+  Brachial 143                                        +---------+------------------+-----+-----------+-------+  PTA     145               0.98 multiphasic         +---------+------------------+-----+-----------+-------+  DP      124               0.84 multiphasic         +---------+------------------+-----+-----------+-------+  Chris Mercer                0.62                     +---------+------------------+-----+-----------+-------+      ASSESSMENT/PLAN: Chris Mercer is a 78 y.o. male presenting with ***   ***   Kayla Part, MD [facility] [phone]

## 2024-05-26 ENCOUNTER — Ambulatory Visit: Attending: Vascular Surgery | Admitting: Vascular Surgery

## 2024-09-09 ENCOUNTER — Other Ambulatory Visit: Payer: Self-pay | Admitting: Internal Medicine

## 2024-09-09 DIAGNOSIS — I10 Essential (primary) hypertension: Secondary | ICD-10-CM

## 2024-09-28 ENCOUNTER — Emergency Department (HOSPITAL_COMMUNITY)

## 2024-09-28 ENCOUNTER — Encounter (HOSPITAL_COMMUNITY): Payer: Self-pay

## 2024-09-28 ENCOUNTER — Inpatient Hospital Stay (HOSPITAL_COMMUNITY)
Admission: EM | Admit: 2024-09-28 | Discharge: 2024-10-03 | DRG: 871 | Disposition: A | Discharge status: Skilled Nursing Facility | Attending: Internal Medicine | Admitting: Internal Medicine

## 2024-09-28 ENCOUNTER — Other Ambulatory Visit: Payer: Self-pay

## 2024-09-28 DIAGNOSIS — Z79899 Other long term (current) drug therapy: Secondary | ICD-10-CM

## 2024-09-28 DIAGNOSIS — I2489 Other forms of acute ischemic heart disease: Secondary | ICD-10-CM | POA: Diagnosis present

## 2024-09-28 DIAGNOSIS — J440 Chronic obstructive pulmonary disease with acute lower respiratory infection: Secondary | ICD-10-CM | POA: Diagnosis present

## 2024-09-28 DIAGNOSIS — S63287A Dislocation of proximal interphalangeal joint of left little finger, initial encounter: Secondary | ICD-10-CM | POA: Diagnosis present

## 2024-09-28 DIAGNOSIS — F32A Depression, unspecified: Secondary | ICD-10-CM | POA: Diagnosis present

## 2024-09-28 DIAGNOSIS — G9341 Metabolic encephalopathy: Secondary | ICD-10-CM | POA: Diagnosis present

## 2024-09-28 DIAGNOSIS — R6520 Severe sepsis without septic shock: Secondary | ICD-10-CM | POA: Diagnosis present

## 2024-09-28 DIAGNOSIS — I959 Hypotension, unspecified: Secondary | ICD-10-CM | POA: Diagnosis not present

## 2024-09-28 DIAGNOSIS — I10 Essential (primary) hypertension: Secondary | ICD-10-CM | POA: Diagnosis not present

## 2024-09-28 DIAGNOSIS — I700 Atherosclerosis of aorta: Secondary | ICD-10-CM | POA: Diagnosis present

## 2024-09-28 DIAGNOSIS — G4733 Obstructive sleep apnea (adult) (pediatric): Secondary | ICD-10-CM | POA: Diagnosis present

## 2024-09-28 DIAGNOSIS — Z1152 Encounter for screening for COVID-19: Secondary | ICD-10-CM | POA: Diagnosis not present

## 2024-09-28 DIAGNOSIS — S63259A Unspecified dislocation of unspecified finger, initial encounter: Secondary | ICD-10-CM

## 2024-09-28 DIAGNOSIS — E872 Acidosis, unspecified: Secondary | ICD-10-CM | POA: Diagnosis present

## 2024-09-28 DIAGNOSIS — E785 Hyperlipidemia, unspecified: Secondary | ICD-10-CM | POA: Diagnosis present

## 2024-09-28 DIAGNOSIS — I1310 Hypertensive heart and chronic kidney disease without heart failure, with stage 1 through stage 4 chronic kidney disease, or unspecified chronic kidney disease: Secondary | ICD-10-CM | POA: Diagnosis present

## 2024-09-28 DIAGNOSIS — R1084 Generalized abdominal pain: Secondary | ICD-10-CM | POA: Diagnosis not present

## 2024-09-28 DIAGNOSIS — E860 Dehydration: Secondary | ICD-10-CM | POA: Diagnosis present

## 2024-09-28 DIAGNOSIS — M1990 Unspecified osteoarthritis, unspecified site: Secondary | ICD-10-CM | POA: Diagnosis present

## 2024-09-28 DIAGNOSIS — G894 Chronic pain syndrome: Secondary | ICD-10-CM | POA: Diagnosis present

## 2024-09-28 DIAGNOSIS — E1140 Type 2 diabetes mellitus with diabetic neuropathy, unspecified: Secondary | ICD-10-CM | POA: Diagnosis present

## 2024-09-28 DIAGNOSIS — R4182 Altered mental status, unspecified: Secondary | ICD-10-CM | POA: Diagnosis present

## 2024-09-28 DIAGNOSIS — K760 Fatty (change of) liver, not elsewhere classified: Secondary | ICD-10-CM | POA: Diagnosis present

## 2024-09-28 DIAGNOSIS — R112 Nausea with vomiting, unspecified: Secondary | ICD-10-CM

## 2024-09-28 DIAGNOSIS — K219 Gastro-esophageal reflux disease without esophagitis: Secondary | ICD-10-CM | POA: Diagnosis present

## 2024-09-28 DIAGNOSIS — E1122 Type 2 diabetes mellitus with diabetic chronic kidney disease: Secondary | ICD-10-CM | POA: Diagnosis present

## 2024-09-28 DIAGNOSIS — N183 Chronic kidney disease, stage 3 unspecified: Secondary | ICD-10-CM | POA: Diagnosis present

## 2024-09-28 DIAGNOSIS — I2510 Atherosclerotic heart disease of native coronary artery without angina pectoris: Secondary | ICD-10-CM | POA: Diagnosis present

## 2024-09-28 DIAGNOSIS — X58XXXA Exposure to other specified factors, initial encounter: Secondary | ICD-10-CM | POA: Diagnosis present

## 2024-09-28 DIAGNOSIS — J189 Pneumonia, unspecified organism: Secondary | ICD-10-CM | POA: Diagnosis present

## 2024-09-28 DIAGNOSIS — Z888 Allergy status to other drugs, medicaments and biological substances status: Secondary | ICD-10-CM

## 2024-09-28 DIAGNOSIS — I4891 Unspecified atrial fibrillation: Secondary | ICD-10-CM | POA: Diagnosis present

## 2024-09-28 DIAGNOSIS — A419 Sepsis, unspecified organism: Secondary | ICD-10-CM | POA: Diagnosis present

## 2024-09-28 DIAGNOSIS — Z87891 Personal history of nicotine dependence: Secondary | ICD-10-CM

## 2024-09-28 DIAGNOSIS — E876 Hypokalemia: Secondary | ICD-10-CM | POA: Diagnosis present

## 2024-09-28 DIAGNOSIS — E1169 Type 2 diabetes mellitus with other specified complication: Secondary | ICD-10-CM

## 2024-09-28 DIAGNOSIS — K5900 Constipation, unspecified: Secondary | ICD-10-CM | POA: Diagnosis present

## 2024-09-28 DIAGNOSIS — D631 Anemia in chronic kidney disease: Secondary | ICD-10-CM | POA: Diagnosis present

## 2024-09-28 DIAGNOSIS — Z886 Allergy status to analgesic agent status: Secondary | ICD-10-CM

## 2024-09-28 DIAGNOSIS — G629 Polyneuropathy, unspecified: Secondary | ICD-10-CM

## 2024-09-28 DIAGNOSIS — F419 Anxiety disorder, unspecified: Secondary | ICD-10-CM | POA: Diagnosis present

## 2024-09-28 DIAGNOSIS — Z7984 Long term (current) use of oral hypoglycemic drugs: Secondary | ICD-10-CM

## 2024-09-28 DIAGNOSIS — Z7951 Long term (current) use of inhaled steroids: Secondary | ICD-10-CM

## 2024-09-28 DIAGNOSIS — R531 Weakness: Secondary | ICD-10-CM

## 2024-09-28 DIAGNOSIS — E119 Type 2 diabetes mellitus without complications: Secondary | ICD-10-CM

## 2024-09-28 DIAGNOSIS — I444 Left anterior fascicular block: Secondary | ICD-10-CM | POA: Diagnosis present

## 2024-09-28 DIAGNOSIS — R911 Solitary pulmonary nodule: Secondary | ICD-10-CM | POA: Diagnosis present

## 2024-09-28 DIAGNOSIS — Z8249 Family history of ischemic heart disease and other diseases of the circulatory system: Secondary | ICD-10-CM

## 2024-09-28 DIAGNOSIS — Z881 Allergy status to other antibiotic agents status: Secondary | ICD-10-CM

## 2024-09-28 DIAGNOSIS — Z955 Presence of coronary angioplasty implant and graft: Secondary | ICD-10-CM

## 2024-09-28 DIAGNOSIS — R410 Disorientation, unspecified: Secondary | ICD-10-CM

## 2024-09-28 LAB — URINALYSIS, W/ REFLEX TO CULTURE (INFECTION SUSPECTED)
Glucose, UA: NEGATIVE mg/dL
Hgb urine dipstick: NEGATIVE
Ketones, ur: 20 mg/dL — AB
Leukocytes,Ua: NEGATIVE
Nitrite: NEGATIVE
Protein, ur: 100 mg/dL — AB
Specific Gravity, Urine: 1.028 (ref 1.005–1.030)
pH: 5 (ref 5.0–8.0)

## 2024-09-28 LAB — RESP PANEL BY RT-PCR (RSV, FLU A&B, COVID)  RVPGX2
Influenza A by PCR: NEGATIVE
Influenza B by PCR: NEGATIVE
Resp Syncytial Virus by PCR: NEGATIVE
SARS Coronavirus 2 by RT PCR: NEGATIVE

## 2024-09-28 LAB — CBC WITH DIFFERENTIAL/PLATELET
Abs Immature Granulocytes: 0.04 K/uL (ref 0.00–0.07)
Basophils Absolute: 0 K/uL (ref 0.0–0.1)
Basophils Relative: 0 %
Eosinophils Absolute: 0 K/uL (ref 0.0–0.5)
Eosinophils Relative: 0 %
HCT: 38.1 % — ABNORMAL LOW (ref 39.0–52.0)
Hemoglobin: 12.8 g/dL — ABNORMAL LOW (ref 13.0–17.0)
Immature Granulocytes: 0 %
Lymphocytes Relative: 9 %
Lymphs Abs: 0.9 K/uL (ref 0.7–4.0)
MCH: 31.2 pg (ref 26.0–34.0)
MCHC: 33.6 g/dL (ref 30.0–36.0)
MCV: 92.9 fL (ref 80.0–100.0)
Monocytes Absolute: 0.8 K/uL (ref 0.1–1.0)
Monocytes Relative: 7 %
Neutro Abs: 9 K/uL — ABNORMAL HIGH (ref 1.7–7.7)
Neutrophils Relative %: 84 %
Platelets: 252 K/uL (ref 150–400)
RBC: 4.1 MIL/uL — ABNORMAL LOW (ref 4.22–5.81)
RDW: 15.6 % — ABNORMAL HIGH (ref 11.5–15.5)
WBC: 10.8 K/uL — ABNORMAL HIGH (ref 4.0–10.5)
nRBC: 0 % (ref 0.0–0.2)

## 2024-09-28 LAB — COMPREHENSIVE METABOLIC PANEL WITH GFR
ALT: 14 U/L (ref 0–44)
AST: 25 U/L (ref 15–41)
Albumin: 2.4 g/dL — ABNORMAL LOW (ref 3.5–5.0)
Alkaline Phosphatase: 74 U/L (ref 38–126)
Anion gap: 16 — ABNORMAL HIGH (ref 5–15)
BUN: 11 mg/dL (ref 8–23)
CO2: 16 mmol/L — ABNORMAL LOW (ref 22–32)
Calcium: 8.5 mg/dL — ABNORMAL LOW (ref 8.9–10.3)
Chloride: 103 mmol/L (ref 98–111)
Creatinine, Ser: 0.85 mg/dL (ref 0.61–1.24)
GFR, Estimated: >60 mL/min (ref >60)
Glucose, Bld: 79 mg/dL (ref 70–99)
Potassium: 3.5 mmol/L (ref 3.5–5.1)
Sodium: 135 mmol/L (ref 135–145)
Total Bilirubin: 0.9 mg/dL (ref 0.0–1.2)
Total Protein: 4.3 g/dL — ABNORMAL LOW (ref 6.5–8.1)

## 2024-09-28 LAB — CREATININE, SERUM
Creatinine, Ser: 0.97 mg/dL (ref 0.61–1.24)
GFR, Estimated: >60 mL/min (ref >60)

## 2024-09-28 LAB — I-STAT CG4 LACTIC ACID, ED
Lactic Acid, Venous: 7.6 mmol/L (ref 0.5–1.9)
Lactic Acid, Venous: 4.6 mmol/L (ref 0.5–1.9)

## 2024-09-28 LAB — BRAIN NATRIURETIC PEPTIDE: B Natriuretic Peptide: 136.3 pg/mL — ABNORMAL HIGH (ref 0.0–100.0)

## 2024-09-28 LAB — CBC
HCT: 40 % (ref 39.0–52.0)
Hemoglobin: 14.1 g/dL (ref 13.0–17.0)
MCH: 31.8 pg (ref 26.0–34.0)
MCHC: 35.3 g/dL (ref 30.0–36.0)
MCV: 90.3 fL (ref 80.0–100.0)
Platelets: 249 K/uL (ref 150–400)
RBC: 4.43 MIL/uL (ref 4.22–5.81)
RDW: 15.3 % (ref 11.5–15.5)
WBC: 8.5 K/uL (ref 4.0–10.5)
nRBC: 0 % (ref 0.0–0.2)

## 2024-09-28 LAB — TROPONIN I (HIGH SENSITIVITY)
Troponin I (High Sensitivity): 116 ng/L (ref <18)
Troponin I (High Sensitivity): 142 ng/L (ref <18)

## 2024-09-28 LAB — PROTIME-INR
INR: 1.4 — ABNORMAL HIGH (ref 0.8–1.2)
Prothrombin Time: 17.5 s — ABNORMAL HIGH (ref 11.4–15.2)

## 2024-09-28 LAB — HEMOGLOBIN A1C
Hgb A1c MFr Bld: 4.7 % — ABNORMAL LOW (ref 4.8–5.6)
Mean Plasma Glucose: 88.19 mg/dL

## 2024-09-28 LAB — GLUCOSE, CAPILLARY: Glucose-Capillary: 111 mg/dL — ABNORMAL HIGH (ref 70–99)

## 2024-09-28 LAB — LIPASE, BLOOD: Lipase: 26 U/L (ref 11–51)

## 2024-09-28 MED ORDER — METRONIDAZOLE 500 MG/100ML IV SOLN
500.0000 mg | Freq: Two times a day (BID) | INTRAVENOUS | Status: DC
  Administered 2024-09-28 – 2024-09-30 (×4): 500 mg via INTRAVENOUS
  Filled 2024-09-28 (×4): qty 100

## 2024-09-28 MED ORDER — ESCITALOPRAM OXALATE 10 MG PO TABS
20.0000 mg | ORAL_TABLET | Freq: Every day | ORAL | Status: DC
  Administered 2024-09-29 – 2024-10-03 (×5): 20 mg via ORAL
  Filled 2024-09-28 (×5): qty 2

## 2024-09-28 MED ORDER — LACTATED RINGERS IV BOLUS
30.0000 mL/kg | Freq: Once | INTRAVENOUS | Status: AC
  Administered 2024-09-28: 2250 mL via INTRAVENOUS

## 2024-09-28 MED ORDER — ACETAMINOPHEN 325 MG PO TABS
650.0000 mg | ORAL_TABLET | Freq: Four times a day (QID) | ORAL | Status: DC | PRN
  Administered 2024-10-02: 650 mg via ORAL
  Filled 2024-09-28 (×2): qty 2

## 2024-09-28 MED ORDER — LISINOPRIL 20 MG PO TABS
40.0000 mg | ORAL_TABLET | Freq: Every day | ORAL | Status: DC
Start: 2024-09-29 — End: 2024-09-29
  Administered 2024-09-29 (×2): 40 mg via ORAL
  Filled 2024-09-28 (×2): qty 2

## 2024-09-28 MED ORDER — ALBUTEROL SULFATE (2.5 MG/3ML) 0.083% IN NEBU: 2.5000 mg | INHALATION_SOLUTION | Freq: Four times a day (QID) | RESPIRATORY_TRACT | Status: DC | PRN

## 2024-09-28 MED ORDER — SODIUM CHLORIDE 0.9 % IV SOLN
2.0000 g | Freq: Once | INTRAVENOUS | Status: AC
  Administered 2024-09-28: 2 g via INTRAVENOUS
  Filled 2024-09-28: qty 12.5

## 2024-09-28 MED ORDER — ONDANSETRON HCL 4 MG/2ML IJ SOLN: 4.0000 mg | Freq: Four times a day (QID) | INTRAMUSCULAR | Status: DC | PRN

## 2024-09-28 MED ORDER — SODIUM CHLORIDE 0.9 % IV SOLN
2.0000 g | Freq: Three times a day (TID) | INTRAVENOUS | Status: DC
  Administered 2024-09-28 – 2024-09-30 (×5): 2 g via INTRAVENOUS
  Filled 2024-09-28 (×5): qty 12.5

## 2024-09-28 MED ORDER — INSULIN ASPART 100 UNIT/ML IJ SOLN
0.0000 [IU] | Freq: Three times a day (TID) | INTRAMUSCULAR | Status: DC
  Administered 2024-09-29 – 2024-10-03 (×6): 2 [IU] via SUBCUTANEOUS
  Administered 2024-10-03: 3 [IU] via SUBCUTANEOUS

## 2024-09-28 MED ORDER — ACETAMINOPHEN 650 MG RE SUPP: 650.0000 mg | Freq: Four times a day (QID) | RECTAL | Status: DC | PRN

## 2024-09-28 MED ORDER — INSULIN ASPART 100 UNIT/ML IJ SOLN: 0.0000 [IU] | Freq: Every day | INTRAMUSCULAR | Status: DC

## 2024-09-28 MED ORDER — LIDOCAINE HCL (PF) 1 % IJ SOLN
INTRAMUSCULAR | Status: AC
  Administered 2024-09-28: 10 mL
  Filled 2024-09-28: qty 5

## 2024-09-28 MED ORDER — GABAPENTIN 600 MG PO TABS
1200.0000 mg | ORAL_TABLET | Freq: Three times a day (TID) | ORAL | Status: DC
Start: 2024-09-29 — End: 2024-09-29
  Administered 2024-09-29 (×2): 1200 mg via ORAL
  Filled 2024-09-28 (×2): qty 4

## 2024-09-28 MED ORDER — SODIUM CHLORIDE 0.9 % IV SOLN: 2.0000 g | Freq: Three times a day (TID) | INTRAVENOUS | Status: DC

## 2024-09-28 MED ORDER — IOHEXOL 350 MG/ML SOLN
75.0000 mL | Freq: Once | INTRAVENOUS | Status: AC | PRN
  Administered 2024-09-28: 75 mL via INTRAVENOUS

## 2024-09-28 MED ORDER — ENOXAPARIN SODIUM 40 MG/0.4ML IJ SOSY
40.0000 mg | PREFILLED_SYRINGE | Freq: Every day | INTRAMUSCULAR | Status: DC
  Administered 2024-09-29: 40 mg via SUBCUTANEOUS
  Filled 2024-09-28: qty 0.4

## 2024-09-28 MED ORDER — AMLODIPINE BESYLATE 5 MG PO TABS
5.0000 mg | ORAL_TABLET | Freq: Every day | ORAL | Status: DC
Start: 2024-09-29 — End: 2024-09-29
  Administered 2024-09-29 (×2): 5 mg via ORAL
  Filled 2024-09-28 (×2): qty 1

## 2024-09-28 MED ORDER — HYDROXYZINE HCL 25 MG PO TABS
25.0000 mg | ORAL_TABLET | Freq: Every day | ORAL | Status: DC
Start: 2024-09-29 — End: 2024-09-29
  Administered 2024-09-29: 25 mg via ORAL
  Filled 2024-09-28: qty 1

## 2024-09-28 MED ORDER — METRONIDAZOLE 500 MG/100ML IV SOLN
500.0000 mg | Freq: Once | INTRAVENOUS | Status: AC
  Administered 2024-09-28: 500 mg via INTRAVENOUS
  Filled 2024-09-28: qty 100

## 2024-09-28 MED ORDER — BUDESON-GLYCOPYRROL-FORMOTEROL 160-9-4.8 MCG/ACT IN AERO
2.0000 | INHALATION_SPRAY | Freq: Two times a day (BID) | RESPIRATORY_TRACT | Status: DC
  Administered 2024-09-29 – 2024-10-03 (×8): 2 via RESPIRATORY_TRACT
  Filled 2024-09-28: qty 5.9

## 2024-09-28 MED ORDER — ONDANSETRON HCL 4 MG PO TABS: 4.0000 mg | ORAL_TABLET | Freq: Four times a day (QID) | ORAL | Status: DC | PRN

## 2024-09-28 MED ORDER — LACTATED RINGERS IV SOLN
INTRAVENOUS | Status: AC
Start: 2024-09-28 — End: 2024-09-29

## 2024-09-28 MED ORDER — VANCOMYCIN HCL 1250 MG/250ML IV SOLN
1250.0000 mg | Freq: Two times a day (BID) | INTRAVENOUS | Status: DC
Start: 2024-09-29 — End: 2024-10-05
  Filled 2024-09-28: qty 250

## 2024-09-28 MED ORDER — BUSPIRONE HCL 10 MG PO TABS
30.0000 mg | ORAL_TABLET | Freq: Two times a day (BID) | ORAL | Status: DC
Start: 2024-09-29 — End: 2024-10-03
  Administered 2024-09-29 – 2024-10-03 (×10): 30 mg via ORAL
  Filled 2024-09-28 (×5): qty 3
  Filled 2024-09-28 (×4): qty 6
  Filled 2024-09-28 (×2): qty 3
  Filled 2024-09-28: qty 6
  Filled 2024-09-28 (×2): qty 3
  Filled 2024-09-28 (×4): qty 6
  Filled 2024-09-28 (×2): qty 3

## 2024-09-28 MED ORDER — LACTATED RINGERS IV SOLN
150.0000 mL/h | INTRAVENOUS | Status: AC
Start: 2024-09-28 — End: 2024-09-29
  Administered 2024-09-28 – 2024-09-29 (×3): 150 mL/h via INTRAVENOUS

## 2024-09-28 MED ORDER — VANCOMYCIN HCL 2000 MG/400ML IV SOLN
2000.0000 mg | Freq: Once | INTRAVENOUS | Status: AC
  Administered 2024-09-29: 2000 mg via INTRAVENOUS
  Filled 2024-09-28 (×2): qty 400

## 2024-09-28 MED ORDER — DOXEPIN HCL 25 MG PO CAPS
150.0000 mg | ORAL_CAPSULE | Freq: Every day | ORAL | Status: DC
Start: 2024-09-29 — End: 2024-10-03
  Administered 2024-09-29 – 2024-10-02 (×5): 150 mg via ORAL
  Filled 2024-09-28 (×6): qty 6
  Filled 2024-09-28: qty 2

## 2024-09-28 MED ORDER — LIDOCAINE HCL (PF) 1 % IJ SOLN
10.0000 mL | Freq: Once | INTRAMUSCULAR | Status: AC
  Filled 2024-09-28: qty 10

## 2024-09-28 NOTE — Progress Notes (Signed)
 Pharmacy Antibiotic Note  Chris Mercer is a 78 y.o. male for which pharmacy has been consulted for cefepime and vancomycin dosing for sepsis.  Patient with a history of COPD, CAD, chronic pain, GERD, HLD, HTN, OSA, depression. Patient presenting with confusion and N/V.  SCr 0.85 WBC 10.8; LA 7.6; T 97.8; HR 61; RR 20 COVID neg / flu neg  Plan: Metronidazole per MD Cefepime 2g q8hr  Vancomycin 2000 mg once then 1250 mg q12hr (eAUC 507.9) unless change in renal function Monitor WBC, fever, renal function, cultures De-escalate when able Levels at steady state     Temp (24hrs), Avg:97.8 F (36.6 C), Min:97.8 F (36.6 C), Max:97.8 F (36.6 C)  Recent Labs  Lab 09/28/24 1421 09/28/24 1424 09/28/24 1756  WBC 10.8*  --   --   CREATININE 0.85  --   --   LATICACIDVEN  --  7.6* 4.6*    CrCl cannot be calculated (Unknown ideal weight.).    Allergies  Allergen Reactions   Azithromycin Anaphylaxis, Swelling and Hives   Fenoprofen Anaphylaxis   Duloxetine Diarrhea    With dark stools      Microbiology results: Pending  Thank you for allowing pharmacy to be a part of this patient's care.  Dorn Buttner, PharmD, BCPS 09/28/2024 7:00 PM ED Clinical Pharmacist -  [phone]

## 2024-09-28 NOTE — H&P (Signed)
 History and Physical    Patient: Chris Mercer FMW:[id number] DOB: 17-Jan-1946 DOA: 09/28/2024 DOS: the patient was seen and examined on 09/28/2024 PCP: Glennon Sand, NP (Inactive)  Patient coming from: Home  Chief Complaint:  Chief Complaint  Patient presents with   Possible Sepsis   HPI: Chris Mercer is a 78 y.o. male with medical history significant of COPD, coronary artery disease, chronic pain syndrome, GERD, hyperlipidemia, essential hypertension, obstructive sleep apnea, depression, who presents to the ER via EMS from home where his son called out saying patient is confused and having nausea vomiting and weakness for the last few days.  There is also new onset atrial fibrillation.  He is usually awake alert oriented but today he is only alert and oriented x 2.  Patient was seen in the emergency room with initial evaluation done showing normal sinus rhythm on his EKG, patient however has elevated troponin up to 116.  Repeat troponin is currently pending.  Initial lactic acid level was more than six 7.6 with repeat dropping to 4.6.  He has mildly elevated BNP has a white count and negative viral panel.  Suspicion for early sepsis of unknown source.  Patient initiated on antibiotics and hydrated.  He is being admitted for observation and suspected early sepsis of unknown source.  Review of Systems: As mentioned in the history of present illness. All other systems reviewed and are negative. Past Medical History:  Diagnosis Date   Anxiety    Aortic atherosclerosis    CAD (coronary artery disease)    a.) MPI 04/16/2021 --> LVEF 71%. Large severe perferion defect in the apex, apicolateral, mid inferolateral, and basal inferolateral location; no RWMAs; ischemia in LCx territory. b.) CCTA 04/25/2021 --> CCS/Agatston score 684.7.   Chronic pain syndrome    CKD (chronic kidney disease), stage III (HCC)    COPD (chronic obstructive pulmonary disease) (HCC)    Depression    Diabetic  neuropathy (HCC)    GERD (gastroesophageal reflux disease)    Hepatic steatosis    Hiatal hernia    History of heart artery stent 2002   a.) type and location unknown   HLD (hyperlipidemia)    Hypertension    LAFB (left anterior fascicular block)    LVH (left ventricular hypertrophy)    OSA (obstructive sleep apnea)    a.) PSG 12/14/2019 --> AHI 6. b.) cannot tolerate nocturnal PAP therapy d/t COPD and significant GERD.   Osteoarthritis    Right middle lobe pulmonary nodule 03/26/2019   a.) CT 03/25/2021 - measured 2.5 mm.   T2DM (type 2 diabetes mellitus) (HCC)    Umbilical hernia    s/p repair   Past Surgical History:  Procedure Laterality Date   AMPUTATION TOE     APPENDECTOMY     CORONARY ANGIOPLASTY WITH STENT PLACEMENT     ESOPHAGOGASTRODUODENOSCOPY (EGD) WITH PROPOFOL  N/A 09/05/2021   Procedure: ESOPHAGOGASTRODUODENOSCOPY (EGD) WITH PROPOFOL ;  Surgeon: Onita Elspeth Sharper, DO;  Location: [facility];  Service: Endoscopy;  Laterality: N/A;   HERNIA REPAIR     x3   INSERTION OF MESH  09/26/2021   Procedure: INSERTION OF MESH;  Surgeon: Jordis Laneta FALCON, MD;  Location: [facility];  Service: General;;   XI ROBOTIC ASSISTED PARAESOPHAGEAL HERNIA REPAIR N/A 09/26/2021   Procedure: XI ROBOTIC ASSISTED PARAESOPHAGEAL HERNIA REPAIR, RNFA to assist;  Surgeon: Jordis Laneta FALCON, MD;  Location: [facility];  Service: General;  Laterality: N/A;   Social History:  reports that he quit smoking about 35  years ago. His smoking use included cigarettes. He has never used smokeless tobacco. He reports that he does not currently use alcohol. He reports that he does not currently use drugs.  Allergies  Allergen Reactions   Azithromycin Anaphylaxis, Swelling and Hives   Fenoprofen Anaphylaxis   Duloxetine Diarrhea    With dark stools       Family History  Problem Relation Age of Onset   Heart disease Father     Prior to Admission medications   Medication Sig Start Date End Date Taking?  Authorizing Provider  albuterol  (VENTOLIN  HFA) 108 (90 Base) MCG/ACT inhaler Inhale 2 puffs into the lungs every 6 (six) hours as needed for wheezing or shortness of breath. 04/26/24 10/23/24  [provider]  amLODipine  (NORVASC ) 5 MG tablet TAKE 1 TABLET BY MOUTH DAILY 09/15/24   Fernand Fredy RAMAN, MD  busPIRone  (BUSPAR ) 30 MG tablet Take 30 mg by mouth 2 (two) times daily.    [provider]  doxepin  (SINEQUAN ) 150 MG capsule TAKE 1 CAPSULE BY MOUTH AT BEDTIME 05/09/24   Scoggins, [facility], NP  escitalopram  (LEXAPRO ) 20 MG tablet Take 20 mg by mouth daily.    [provider]  Fluticasone-Umeclidin-Vilant (TRELEGY ELLIPTA) 100-62.5-25 MCG/ACT AEPB Inhale 1 puff into the lungs daily at 12 noon.    [provider]  gabapentin  (NEURONTIN ) 600 MG tablet Take 2 tablets (1,200 mg total) by mouth 3 (three) times daily. 09/03/23   Fernand Fredy RAMAN, MD  glipiZIDE (GLUCOTROL XL) 5 MG 24 hr tablet Take 5 mg by mouth daily with breakfast.    [provider]  hydrOXYzine (ATARAX/VISTARIL) 25 MG tablet Take 25 mg by mouth daily.    [provider]  lisinopril  (PRINIVIL ,ZESTRIL ) 40 MG tablet Take 40 mg by mouth daily.    [provider]  metFORMIN  (GLUCOPHAGE ) 500 MG tablet Take 1 tablet (500 mg total) by mouth 2 (two) times daily with a meal. 12/18/23   Orlean Alan HERO, FNP  rosuvastatin (CRESTOR) 40 MG tablet Take 40 mg by mouth daily. Patient not taking: Reported on 06/22/2023    [provider]    Physical Exam: Vitals:   09/28/24 1400 09/28/24 1500 09/28/24 1723  BP: (!) 146/87 (!) 161/98   Pulse: 81 62   Resp: 18 (!) 21   Temp:   97.8 F (36.6 C)  TempSrc:   Oral  SpO2: 100% 94%    Constitutional: Acutely ill looking, mildly confused, NAD, calm, comfortable Eyes: PERRL, lids and conjunctivae normal ENMT: Mucous membranes are dry. Posterior pharynx clear of any exudate or lesions.Normal dentition.  Neck: normal, supple, no masses, no  thyromegaly Respiratory: clear to auscultation bilaterally, no wheezing, no crackles. Normal respiratory effort. No accessory muscle use.  Cardiovascular: Regular rate and rhythm, no murmurs / rubs / gallops. No extremity edema. 2+ pedal pulses. No carotid bruits.  Abdomen: no tenderness, no masses palpated. No hepatosplenomegaly. Bowel sounds positive.  Musculoskeletal: Good range of motion, no joint swelling or tenderness, Skin: no rashes, lesions, ulcers. No induration Neurologic: CN 2-12 grossly intact. Sensation intact, DTR normal. Strength 5/5 in all 4.  Psychiatric: Alert and oriented x 2  Data Reviewed:  Temperature 97.8, blood pressure 143/87, pulse 81.  Respiratory rate of 21, white count 10.8 hemoglobin 12.8.  CO2 of 16.  Lactic acid 7.6, calcium 8.5 initial troponin 116 BNP 136.  Acute viral screen is negative for flu COVID and RSV.  Urinalysis is negative.  Chest x-ray showed new ill-defined  nodular density in the right upper lobe.  X-ray of the hand 2 view showed complete dorsal dislocation of fifth proximal interphalangeal joint.  CT abdomen and pelvis showed 2 cm low-density area in the inferior and inferior tip of the right lobe of the liver.  Moderate stool burden.  Head CT without contrast showed no acute findings.  Assessment and Plan:  #1 sepsis: Suspected due to pneumonia.  Chest x-ray suggested infiltrate but recommends CT chest to evaluate.  Patient has met sepsis criteria if pneumonia with his white count and respiratory rate.  We will admit and treat with IV antibiotics.  Blood cultures have been obtained will follow results.  Hydrate and monitor.  #2 acute metabolic encephalopathy: Could be due to sepsis.  Dehydration is possible to also.  Other chronic medical problems may also be contributing.  Patient is not hypoxic but has lactic acidosis.  Could just be dehydration.  Will monitor patient's response in the hospital.  Monitor closely  #3 essential hypertension:  Confirm and resume home regimen  #4 type 2 diabetes: Non-insulin -dependent.  Resume home regimen  #5 depression with anxiety: Confirm on resume home regimen.  #6 hyperlipidemia: Resume statin  #7 lactic acidosis: Hydrate and monitor lactic acid level.  #8 normocytic anemia: Continue to monitor hemoglobin.  #9 elevated troponin: Could be from patient's tachycardia.  Will trend troponins.  If they trend upwards this could reflect acute coronary syndrome.  Will consider echocardiogram in the case and cardiology involvement.    Advance Care Planning:   Code Status: Prior full code  Consults: None  Family Communication: Son  Severity of Illness: The appropriate patient status for this patient is INPATIENT. Inpatient status is judged to be reasonable and necessary in order to provide the required intensity of service to ensure the patient's safety. The patient's presenting symptoms, physical exam findings, and initial radiographic and laboratory data in the context of their chronic comorbidities is felt to place them at high risk for further clinical deterioration. Furthermore, it is not anticipated that the patient will be medically stable for discharge from the hospital within 2 midnights of admission.   * I certify that at the point of admission it is my clinical judgment that the patient will require inpatient hospital care spanning beyond 2 midnights from the point of admission due to high intensity of service, high risk for further deterioration and high frequency of surveillance required.*  AuthorBETHA SIM KNOLL, MD 09/28/2024 6:59 PM  For on call review [email or web address].

## 2024-09-28 NOTE — ED Notes (Signed)
 Provider Rogelia MD notified of lactic 7.58

## 2024-09-28 NOTE — ED Notes (Signed)
 Patient given [place] sandwich

## 2024-09-28 NOTE — ED Triage Notes (Signed)
 Patient BIB EMS from home after son called out due to AMS and N/V and weakness for the last few days. Patient also is experiencing new onset AFIB. Patient is typically alert and oriented, but today is only alert and oriented x2. Patient was given 650 of tylenol  and 4 of zofran  en route.

## 2024-09-28 NOTE — ED Provider Notes (Signed)
.  Reduction of dislocation  Date/Time: 09/28/2024 4:21 PM  Performed by: Renu Asby, DO Authorized by: Amala Petion, DO  Consent: Verbal consent obtained Risks and benefits: risks, benefits and alternatives were discussed Consent given by: patient Test results: test results available and properly labeled Imaging studies: imaging studies available Patient identity confirmed: verbally with patient and arm band Time out: Immediately prior to procedure a time out was called to verify the correct patient, procedure, equipment, support staff and site/side marked as required. Preparation: Patient was prepped and draped in the usual sterile fashion. Local anesthesia used: yes Anesthesia: digital block  Anesthesia: Local anesthesia used: yes Local Anesthetic: lidocaine  1% with epinephrine   Sedation: Patient sedated: no  Patient tolerance: patient tolerated the procedure well with no immediate complications       Josephanthony Tindel, DO 09/28/24 1622

## 2024-09-28 NOTE — ED Provider Notes (Signed)
 [place] EMERGENCY DEPARTMENT AT [facility] Provider Note   CSN: [id number] Arrival date & time: 09/28/24  1330     History Chief Complaint  Patient presents with   Possible Sepsis    HPI: Chris Mercer is a 78 y.o. male with history pertinent for HTN, T2DM, CKD, HLD who presents complaining of multiple complaints. Patient arrived via EMS from home.  History provided by patient and EMS.  No interpreter required during this encounter.  EMS reports that they were called out to scene by the patient's son because the patient was reportedly altered.  Reportedly is baseline A&O x 4, however was A&O x 2 today.  En route blood sugar was 110s.  Patient had a tactile fever on EMSs exam, therefore they administered 650 mg of Tylenol .  Additionally, plan was to for transport to [facility], however EMS believes that patient had A-fib on the monitor and route, therefore they rerouted to [facility].  Patient denies known history of A-fib, denies use of anticoagulants.  Reports that he has had approximately 4 days of fatigue, malaise, nausea without vomiting, chills.  Denies fever, chest pain, shortness of breath, dysuria.  Patient also reports that he has had deformity of his left pinky for approximately 6 weeks, is expresses concern that it may be broken.  Patient's recorded medical, surgical, social, medication list and allergies were reviewed in the Snapshot window as part of the initial history.   Prior to Admission medications   Medication Sig Start Date End Date Taking? Authorizing Provider  albuterol  (VENTOLIN  HFA) 108 (90 Base) MCG/ACT inhaler Inhale 2 puffs into the lungs every 6 (six) hours as needed for wheezing or shortness of breath. 04/26/24 10/23/24  [provider]  amLODipine  (NORVASC ) 5 MG tablet TAKE 1 TABLET BY MOUTH DAILY 09/15/24   Fernand Fredy RAMAN, MD  busPIRone  (BUSPAR ) 30 MG tablet Take 30 mg by mouth 2 (two) times daily.    [provider]  doxepin   (SINEQUAN ) 150 MG capsule TAKE 1 CAPSULE BY MOUTH AT BEDTIME 05/09/24   Scoggins, Amber, NP  escitalopram  (LEXAPRO ) 20 MG tablet Take 20 mg by mouth daily.    [provider]  Fluticasone-Umeclidin-Vilant (TRELEGY ELLIPTA) 100-62.5-25 MCG/ACT AEPB Inhale 1 puff into the lungs daily at 12 noon.    [provider]  gabapentin  (NEURONTIN ) 600 MG tablet Take 2 tablets (1,200 mg total) by mouth 3 (three) times daily. 09/03/23   Fernand Fredy RAMAN, MD  glipiZIDE (GLUCOTROL XL) 5 MG 24 hr tablet Take 5 mg by mouth daily with breakfast.    [provider]  hydrOXYzine (ATARAX/VISTARIL) 25 MG tablet Take 25 mg by mouth daily.    [provider]  lisinopril  (PRINIVIL ,ZESTRIL ) 40 MG tablet Take 40 mg by mouth daily.    [provider]  metFORMIN  (GLUCOPHAGE ) 500 MG tablet Take 1 tablet (500 mg total) by mouth 2 (two) times daily with a meal. 12/18/23   Orlean Alan HERO, FNP  rosuvastatin (CRESTOR) 40 MG tablet Take 40 mg by mouth daily. Patient not taking: Reported on 06/22/2023    [provider]     Allergies: Azithromycin, Fenoprofen, and Duloxetine   Review of Systems   ROS as per HPI  Physical Exam Updated Vital Signs BP (!) 161/98   Pulse 62   Temp 97.8 F (36.6 C) (Oral)   Resp (!) 21   SpO2 94%  Physical Exam Vitals and nursing note reviewed.  Constitutional:  General: He is not in acute distress.    Appearance: He is well-developed.  HENT:     Head: Normocephalic and atraumatic.  Eyes:     Conjunctiva/sclera: Conjunctivae normal.  Cardiovascular:     Rate and Rhythm: Normal rate and regular rhythm.     Pulses:          Radial pulses are 2+ on the right side and 2+ on the left side.     Heart sounds: No murmur heard. Pulmonary:     Effort: Pulmonary effort is normal. No respiratory distress.     Breath sounds: Normal breath sounds.  Abdominal:     Palpations: Abdomen is soft.     Tenderness: There is generalized abdominal  tenderness (Moderate, diffuse). There is no guarding or rebound.  Musculoskeletal:        General: Deformity (Deformity and decreased range of motion of the left fifth PIP) present.     Cervical back: Neck supple.  Skin:    General: Skin is warm and dry.     Capillary Refill: Capillary refill takes less than 2 seconds.  Neurological:     Mental Status: He is alert.  Psychiatric:        Mood and Affect: Mood normal.     ED Course/ Medical Decision Making/ A&P    Procedures .Ortho Injury Treatment  Date/Time: 09/28/2024 5:56 PM  Performed by: Rogelia Jerilynn RAMAN, MD Authorized by: Rogelia Jerilynn RAMAN, MD   Consent:    Consent obtained:  Verbal   Consent given by:  Patient   Risks discussed:  Fracture, irreducible dislocation, nerve damage, recurrent dislocation, restricted joint movement, stiffness and vascular damage   Alternatives discussed:  No treatment, immobilization and referralInjury location: finger Location details: left little finger Injury type: dislocation Pre-procedure distal perfusion: normal Pre-procedure neurological function: normal Pre-procedure range of motion: reduced Anesthesia: digital block  Anesthesia: Local anesthesia used: yes Local Anesthetic: lidocaine  1% without epinephrine  Anesthetic total: 7 mL  Patient sedated: NoManipulation performed: yes Reduction successful: no Comments: Several attempts made at traction countertraction unsuccessfully.  Given duration of several weeks of dislocation, doubt that patient will have successful reduction in the ED.  Given patient will require more emergent management of his sepsis and lactic acid elevation, referral placed for outpatient hand surgery follow-up with Dr. Shari      Medications Ordered in ED Medications  lactated ringers  infusion ( Intravenous New Bag/Given 09/28/24 1509)  lactated ringers  bolus 2,250 mL (2,250 mLs Intravenous New Bag/Given 09/28/24 1504)  ceFEPIme (MAXIPIME) 2 g in sodium  chloride 0.9 % 100 mL IVPB (0 g Intravenous Stopped 09/28/24 1721)  metroNIDAZOLE (FLAGYL) IVPB 500 mg (0 mg Intravenous Stopped 09/28/24 1737)  lidocaine  (PF) (XYLOCAINE ) 1 % injection 10 mL (10 mLs Other Given 09/28/24 1504)  iohexol  (OMNIPAQUE ) 350 MG/ML injection 75 mL (75 mLs Intravenous Contrast Given 09/28/24 1536)    Medical Decision Making:   Dhanush Jokerst is a 78 y.o. male who presents for altered mental status, fever, possible arrhythmia as per above.  Physical exam is pertinent for regular rate and rhythm on my exam, no other focal abnormalities.   The differential includes but is not limited to A-fib, sinus rhythm with baseline artifact, pancreatitis, sepsis, intra-abdominal infection, fracture, dislocation, heart failure, ACS, demand ischemia, pneumonia, UTI  Independent historian: EMS  External data reviewed: No pertinent external data  Labs: Ordered, Independent interpretation, and Details: CBC with slight leukocytosis to 10.8 with left shift.  Mild anemia to 12.8, similar  in comparison to prior.  No thrombocytosis or thrombocytopenia.  CMP without AKI, emergent electrolyte derangement, emergent LFT abnormality UA with mild ketonuria and proteinuria without UTI.  Coags mildly elevated at 17.5 and 1.4 respectively.  Initial troponin elevated at 116.  BNP mildly elevated at 136.  Lactic acid significantly elevated at 7.6.  Radiology: Ordered, Independent interpretation, and Details: Chest x-ray without focal airspace opacification, cardiomediastinal silhouette gentian, pneumothorax, pleural effusion, bony derangement.  X-ray of the hand with dislocation of the left fifth PIP.  CT head without ICH, displaced fracture, loss of gray/white matter differentiation, MLS, mass lesion.  CT of the abdomen and pelvis without focal fat stranding, fluid collection, free fluid, free air CT ABDOMEN PELVIS W CONTRAST Result Date: 09/28/2024 CLINICAL DATA:  Nausea, vomiting and abdominal pain. EXAM:  CT ABDOMEN AND PELVIS WITH CONTRAST TECHNIQUE: Multidetector CT imaging of the abdomen and pelvis was performed using the standard protocol following bolus administration of intravenous contrast. RADIATION DOSE REDUCTION: This exam was performed according to the departmental dose-optimization program which includes automated exposure control, adjustment of the mA and/or kV according to patient size and/or use of iterative reconstruction technique. CONTRAST:  75mL OMNIPAQUE  IOHEXOL  350 MG/ML SOLN COMPARISON:  CT abdomen pelvis [facility] 03/13/2024 FINDINGS: Lower chest: No acute abnormality.  Stable small hiatal hernia. Hepatobiliary: Unremarkable gallbladder. No biliary ductal dilatation. Within the liver, small low-density area near the inferior and anterior tip of the right lobe measures approximately 2 cm in diameter. Although this may represent a small cyst, this did not appear to be present previously and a small area of infection/inflammation cannot be excluded. Pancreas: Unremarkable. No pancreatic ductal dilatation or surrounding inflammatory changes. Spleen: Normal in size without focal abnormality. Adrenals/Urinary Tract: Stable appearance of kidneys without hydronephrosis or calculi. Stable posterior left renal cyst has a benign appearance. The bladder is moderately distended. Stomach/Bowel: Moderate stool in the proximal colon and rectum. No evidence of bowel obstruction, ileus or free air. Status post appendectomy. Vascular/Lymphatic: Atherosclerosis of the abdominal aorta without aneurysm. No lymphadenopathy identified. Reproductive: Prostate is unremarkable. Other: Stable small left inguinal hernia containing fat. No ascites. Musculoskeletal: No acute or significant osseous findings. IMPRESSION: 1. 2 cm low-density area in the inferior and anterior tip of the right lobe of the liver. Although this may represent a small cyst, this did not appear to be present previously and a small area of  infection/inflammation cannot be excluded. 2. Moderate stool in the proximal colon and rectum. 3. Stable small hiatal hernia. 4. Stable small left inguinal hernia containing fat. 5. Aortic atherosclerosis. Electronically Signed   By: Marcey Moan M.D.   On: 09/28/2024 16:10   CT Head Wo Contrast Result Date: 09/28/2024 CLINICAL DATA:  Altered level of consciousness, nausea and vomiting, weakness EXAM: CT HEAD WITHOUT CONTRAST TECHNIQUE: Contiguous axial images were obtained from the base of the skull through the vertex without intravenous contrast. RADIATION DOSE REDUCTION: This exam was performed according to the departmental dose-optimization program which includes automated exposure control, adjustment of the mA and/or kV according to patient size and/or use of iterative reconstruction technique. COMPARISON:  03/08/2024 FINDINGS: Brain: No acute infarct or hemorrhage. The lateral ventricles and midline structures appear unremarkable. No acute extra-axial fluid collections. No mass effect. Vascular: No hyperdense vessel or unexpected calcification. Skull: Normal. Negative for fracture or focal lesion. Sinuses/Orbits: No acute finding. Other: None. IMPRESSION: 1. Stable head CT, no acute intracranial process. Electronically Signed   By: Ozell Delores HERO.D.  On: 09/28/2024 16:00   DG Hand 2 View Left Result Date: 09/28/2024 CLINICAL DATA:  Left fifth finger deformity. EXAM: LEFT HAND - 2 VIEW COMPARISON:  None Available. FINDINGS: There is complete dorsal dislocation of the fifth proximal interphalangeal joint. No fracture is noted. IMPRESSION: Complete dorsal dislocation of fifth proximal interphalangeal joint. Electronically Signed   By: Lynwood Landy Raddle M.D.   On: 09/28/2024 14:33   DG Chest Port 1 View Result Date: 09/28/2024 CLINICAL DATA:  Altered mental status, weakness. EXAM: PORTABLE CHEST 1 VIEW COMPARISON:  October 26, 2022. FINDINGS: The heart size and mediastinal contours are within normal  limits. Minimal left basilar subsegmental atelectasis or scarring is noted. New ill-defined nodular density is noted in right upper lobe. The visualized skeletal structures are unremarkable. IMPRESSION: New ill-defined nodular density seen in right upper lobe. CT scan of the chest is recommended for further evaluation. Electronically Signed   By: Lynwood Landy Raddle M.D.   On: 09/28/2024 14:31    EKG/Medicine tests: Ordered and Independent interpretation EKG Interpretation: Junctional rhythm Left anterior fascicular block Left ventricular hypertrophy Anterior Q waves, possibly due to LVH Confirmed by Rogelia Satterfield ([id number]) on 09/28/2024 5:10:27 PM                Interventions:30 cc/kg LR bolus, cefepime, Flagyl, lidocaine   See the EMR for full details regarding lab and imaging results.  Patient presents for altered mental status, nausea/vomiting, possible fever prior to arrival and EMS concern for A-fib.  On my exam patient has a regular rate on palpation of pulse and auscultation of heart.  EKG obtained and has significant noise obscuring any potential P waves, however patient does have very regular RR interval, which is more consistent with sinus rhythm rather than atrial fibrillation.  Do feel that patient warrants CT of the head given mental status change, as well as CT of the abdomen pelvis given numerous days of nausea, vomiting as well as broad-spectrum labs.  Given possible underlying atrial fibrillation will additionally obtain BNP and troponin, though patient without any cardiopulmonary symptoms on exam.   With regard to finger deformity, patient reports that this has been ongoing for several weeks, x-ray does reveal dislocation.  Attempted reduction as per procedure note above, unsuccessful, will refer to outpatient hand surgery given irreducibility and chronicity of this injury.  Labs obtained and patient does have significant lactic acid elevation to 7.6, therefore patient initiated as a code  sepsis.  Provided cefepime and metronidazole for broad-spectrum coverage and coverage of possible intra-abdominal infection given he has nausea and abdominal pain.  Notably patient is hemodynamically stable with normotension to hypertension on exam as well as normal heart rate.  Repeat lactic acid pending.  Troponin also significantly elevated at 116, patient without any cardiopulmonary symptoms, therefore will obtain delta prior to initiating heparin .  CT head was obtained given patient was reportedly altered, however patient is now A and O x 4, and CT does not reveal acute intracranial process.  CT of the abdomen and pelvis also does not reveal etiology of patient's nausea, abdominal pain or sepsis.  However given significant lactic acid elevation, do feel that patient requires admission.  Plan at the time of signout, follow-up repeat lactic acid and repeat troponin.  Consider heparin  if troponin significantly uptrending.  Consider critical care consult if lactic acid significantly uptrending versus admission to medicine if lactic acid downtrending after fluids.  Presentation is most consistent with acute life/limb-threatening illness and Current presentation is complicated  by underlying chronic conditions  Discussion of management or test interpretations with external provider(s): None by the time of handoff  Risk Drugs:Prescription drug management Treatment: Decision regarding hospitalization  Disposition: HANDOFF: At the time of signout, the patients repeat lactic acid and repeat troponin had not yet been completed. I transferred care of the patient at the time of signout to Dr. Corinthia. I informed the incoming care provider of the patient's history, status, and management plan. I addressed all of their concerns and/or questions to the best of my ability. Please refer to the incoming care provider's note for details regarding the remainder of the patient's ED course and disposition.  MDM  generated using voice dictation software and may contain dictation errors.  Please contact me for any clarification or with any questions.  Clinical Impression:  1. Generalized abdominal pain   2. Nausea and vomiting, unspecified vomiting type   3. Altered mental status, unspecified altered mental status type   4. Dislocation of finger, initial encounter      Data Unavailable   Final Clinical Impression(s) / ED Diagnoses Final diagnoses:  Generalized abdominal pain  Nausea and vomiting, unspecified vomiting type  Altered mental status, unspecified altered mental status type  Dislocation of finger, initial encounter    Rx / DC Orders ED Discharge Orders          Ordered    Ambulatory referral to Orthopedic Surgery        09/28/24 1753             Rogelia Jerilynn RAMAN, MD 09/28/24 1812

## 2024-09-28 NOTE — Sepsis Progress Note (Signed)
 eLink is following this Code Sepsis.

## 2024-09-28 NOTE — ED Provider Notes (Signed)
 Patient care assumed as a handoff from prior provider. Case discussed in detail at signout including history, physical exam findings, diagnostic workup, and treatment course up to this point.  I have reviewed the chart, labs, imaging, and clinical course.   Please refer to initial ED note since the prior ED provider primarily managed this patient.  I am assuming care in the later phase of the ED visit.  I have reevaluated the patient to confirm clinical stability and plan of care.     Patient pending repeat lactic acid>> then we can proceed with admission Patient came in by EMS from home for altered mental status.  Baseline is A&O x 4. EKG showing normal sinus rhythm He reported a fever and nausea-CT abdomen pelvis unremarkable other than moderate stool burden Trop elevated at 116-repeat pending to decide if heparin  needed.  Likely secondary to demand Lactic acid elevated at 7.6 >> repeat 4.6 Patient received broad-spectrum antibiotics -cefepime, Flagyl, Vanc BNP 136 Negative urinalysis White blood cell count 10.8 Viral panel negative  Dx: Altered mental status Lactic acidosis Elevated troponin   Neisha Hinger, DO 09/28/24 1805

## 2024-09-29 ENCOUNTER — Inpatient Hospital Stay (HOSPITAL_COMMUNITY)

## 2024-09-29 DIAGNOSIS — R1084 Generalized abdominal pain: Secondary | ICD-10-CM | POA: Diagnosis not present

## 2024-09-29 DIAGNOSIS — I4891 Unspecified atrial fibrillation: Secondary | ICD-10-CM | POA: Diagnosis not present

## 2024-09-29 DIAGNOSIS — R4182 Altered mental status, unspecified: Secondary | ICD-10-CM | POA: Diagnosis not present

## 2024-09-29 DIAGNOSIS — S63259A Unspecified dislocation of unspecified finger, initial encounter: Secondary | ICD-10-CM

## 2024-09-29 LAB — BLOOD GAS, ARTERIAL
Acid-Base Excess: 1.1 mmol/L (ref 0.0–2.0)
Bicarbonate: 25.1 mmol/L (ref 20.0–28.0)
O2 Saturation: 97.6 %
Patient temperature: 36.1
pCO2 arterial: 36 mmHg (ref 32–48)
pH, Arterial: 7.45 (ref 7.35–7.45)
pO2, Arterial: 66 mmHg — ABNORMAL LOW (ref 83–108)

## 2024-09-29 LAB — CBC
HCT: 40 % (ref 39.0–52.0)
Hemoglobin: 13.9 g/dL (ref 13.0–17.0)
MCH: 31.5 pg (ref 26.0–34.0)
MCHC: 34.8 g/dL (ref 30.0–36.0)
MCV: 90.7 fL (ref 80.0–100.0)
Platelets: 204 K/uL (ref 150–400)
RBC: 4.41 MIL/uL (ref 4.22–5.81)
RDW: 15.3 % (ref 11.5–15.5)
WBC: 6.4 K/uL (ref 4.0–10.5)
nRBC: 0 % (ref 0.0–0.2)

## 2024-09-29 LAB — LACTIC ACID, PLASMA
Lactic Acid, Venous: 1 mmol/L (ref 0.5–1.9)
Lactic Acid, Venous: 1.2 mmol/L (ref 0.5–1.9)

## 2024-09-29 LAB — COMPREHENSIVE METABOLIC PANEL WITH GFR
ALT: 20 U/L (ref 0–44)
AST: 31 U/L (ref 15–41)
Albumin: 2.8 g/dL — ABNORMAL LOW (ref 3.5–5.0)
Alkaline Phosphatase: 81 U/L (ref 38–126)
Anion gap: 11 (ref 5–15)
BUN: 12 mg/dL (ref 8–23)
CO2: 22 mmol/L (ref 22–32)
Calcium: 8.8 mg/dL — ABNORMAL LOW (ref 8.9–10.3)
Chloride: 107 mmol/L (ref 98–111)
Creatinine, Ser: 0.92 mg/dL (ref 0.61–1.24)
GFR, Estimated: >60 mL/min (ref >60)
Glucose, Bld: 94 mg/dL (ref 70–99)
Potassium: 2.8 mmol/L — ABNORMAL LOW (ref 3.5–5.1)
Sodium: 140 mmol/L (ref 135–145)
Total Bilirubin: 1.4 mg/dL — ABNORMAL HIGH (ref 0.0–1.2)
Total Protein: 5 g/dL — ABNORMAL LOW (ref 6.5–8.1)

## 2024-09-29 LAB — ECHOCARDIOGRAM COMPLETE
Area-P 1/2: 3.85 cm2
Height: 72 in
S' Lateral: 1.8 cm
Weight: 2917.13 [oz_av]

## 2024-09-29 LAB — GLUCOSE, CAPILLARY
Glucose-Capillary: 88 mg/dL (ref 70–99)
Glucose-Capillary: 128 mg/dL — ABNORMAL HIGH (ref 70–99)
Glucose-Capillary: 102 mg/dL — ABNORMAL HIGH (ref 70–99)
Glucose-Capillary: 99 mg/dL (ref 70–99)

## 2024-09-29 LAB — PROTIME-INR
INR: 1.1 (ref 0.8–1.2)
Prothrombin Time: 15.2 s (ref 11.4–15.2)

## 2024-09-29 LAB — AMMONIA: Ammonia: 39 umol/L — ABNORMAL HIGH (ref 9–35)

## 2024-09-29 LAB — CORTISOL-AM, BLOOD: Cortisol - AM: 12 ug/dL (ref 6.7–22.6)

## 2024-09-29 LAB — MAGNESIUM: Magnesium: 1.4 mg/dL — ABNORMAL LOW (ref 1.7–2.4)

## 2024-09-29 LAB — TSH: TSH: 4.108 u[IU]/mL (ref 0.350–4.500)

## 2024-09-29 MED ORDER — HALOPERIDOL LACTATE 5 MG/ML IJ SOLN
1.0000 mg | Freq: Once | INTRAMUSCULAR | Status: AC
  Administered 2024-09-29: 1 mg via INTRAVENOUS
  Filled 2024-09-29: qty 1

## 2024-09-29 MED ORDER — HEPARIN (PORCINE) 25000 UT/250ML-% IV SOLN
1150.0000 [IU]/h | INTRAVENOUS | Status: AC
Start: 2024-09-29 — End: 2024-09-30
  Administered 2024-09-29 – 2024-09-30 (×2): 1150 [IU]/h via INTRAVENOUS
  Filled 2024-09-29 (×2): qty 250

## 2024-09-29 MED ORDER — LACTATED RINGERS IV BOLUS
1000.0000 mL | Freq: Once | INTRAVENOUS | Status: AC
Start: 2024-09-29 — End: 2024-09-29
  Administered 2024-09-29: 1000 mL via INTRAVENOUS

## 2024-09-29 MED ORDER — POTASSIUM CHLORIDE CRYS ER 20 MEQ PO TBCR
60.0000 meq | EXTENDED_RELEASE_TABLET | ORAL | Status: AC
  Administered 2024-09-29 (×2): 60 meq via ORAL
  Filled 2024-09-29 (×2): qty 3

## 2024-09-29 MED ORDER — LACTATED RINGERS IV BOLUS
500.0000 mL | Freq: Once | INTRAVENOUS | Status: AC
  Administered 2024-09-29: 500 mL via INTRAVENOUS

## 2024-09-29 MED ORDER — LACTATED RINGERS IV SOLN: INTRAVENOUS | Status: DC

## 2024-09-29 MED ORDER — VANCOMYCIN HCL IN DEXTROSE 1-5 GM/200ML-% IV SOLN
1000.0000 mg | Freq: Two times a day (BID) | INTRAVENOUS | Status: DC
  Administered 2024-09-29 – 2024-09-30 (×3): 1000 mg via INTRAVENOUS
  Filled 2024-09-29 (×4): qty 200

## 2024-09-29 MED ORDER — SODIUM CHLORIDE 0.9 % IV SOLN: INTRAVENOUS | Status: AC | PRN

## 2024-09-29 MED ORDER — GABAPENTIN 300 MG PO CAPS
300.0000 mg | ORAL_CAPSULE | Freq: Three times a day (TID) | ORAL | Status: DC
Start: 2024-09-29 — End: 2024-09-29

## 2024-09-29 MED ORDER — LABETALOL HCL 5 MG/ML IV SOLN: 5.0000 mg | INTRAVENOUS | Status: DC | PRN

## 2024-09-29 MED ORDER — HYDROCERIN EX CREA
TOPICAL_CREAM | Freq: Two times a day (BID) | CUTANEOUS | Status: DC
  Administered 2024-09-29 – 2024-09-30 (×2): 1 via TOPICAL
  Filled 2024-09-29: qty 113

## 2024-09-29 MED ORDER — METOPROLOL TARTRATE 25 MG PO TABS
25.0000 mg | ORAL_TABLET | Freq: Two times a day (BID) | ORAL | Status: DC
  Administered 2024-09-29: 25 mg via ORAL
  Filled 2024-09-29: qty 1

## 2024-09-29 MED ORDER — LACTATED RINGERS IV BOLUS
1000.0000 mL | Freq: Once | INTRAVENOUS | Status: AC
  Administered 2024-09-29: 1000 mL via INTRAVENOUS

## 2024-09-29 MED ORDER — IOHEXOL 350 MG/ML SOLN
75.0000 mL | Freq: Once | INTRAVENOUS | Status: AC | PRN
Start: 2024-09-29 — End: 2024-09-29
  Administered 2024-09-29: 75 mL via INTRAVENOUS

## 2024-09-29 MED ORDER — MAGNESIUM SULFATE 4 GM/100ML IV SOLN
4.0000 g | Freq: Once | INTRAVENOUS | Status: AC
  Administered 2024-09-29: 4 g via INTRAVENOUS
  Filled 2024-09-29: qty 100

## 2024-09-29 NOTE — Progress Notes (Addendum)
 MEWS Progress Note  Patient Details Name: Nikolaos Maddocks MRN: [id number] DOB: October 26, 1946 Today's Date: 09/29/2024   MEWS Flowsheet Documentation:  Assess: MEWS Score Temp: (!) 97.5 F (36.4 C) BP: 91/72 MAP (mmHg): 80 Pulse Rate: (!) 107 ECG Heart Rate: (!) 124 Resp: 17 Level of Consciousness: New agitation confusion SpO2: 96 % O2 Device: Room Air Assess: MEWS Score MEWS Temp: 0 MEWS Systolic: 1 MEWS Pulse: 2 MEWS RR: 0 MEWS LOC: 1 MEWS Score: 4 MEWS Score Color: Red Assess: SIRS CRITERIA SIRS Temperature : 0 SIRS Respirations : 0 SIRS Pulse: 1 SIRS WBC: 0 SIRS Score Sum : 1 SIRS Temperature : 0 SIRS Pulse: 1 SIRS Respirations : 0 SIRS WBC: 0 SIRS Score Sum : 1 Assess: if the MEWS score is Yellow or Red Were vital signs accurate and taken at a resting state?: Yes Does the patient meet 2 or more of the SIRS criteria?: Yes Does the patient have a confirmed or suspected source of infection?: No MEWS guidelines implemented : Yes, red Treat MEWS Interventions: Considered administering scheduled or prn medications/treatments as ordered Take Vital Signs Increase Vital Sign Frequency : Red: Q1hr x2, continue Q4hrs until patient remains green for 12hrs Escalate MEWS: Escalate: Red: Discuss with charge nurse and notify provider. Consider notifying RRT. If remains red for 2 hours consider need for higher level of care        Will CHRISTELLA Ryder 09/29/2024, 3:00 PM    09/29/24 1137 09/29/24 1138  Assess: MEWS Score  Temp  --  (!) 96.4 F (35.8 C)  BP  --  128/71  MAP (mmHg)  --  87  Pulse Rate  --  (!) 143  ECG Heart Rate  --  (!) 144  Resp  --  20  SpO2  --  99 %  O2 Device  --  Room Air  Assess: MEWS Score  MEWS Temp  --  1  MEWS Systolic  --  0  MEWS Pulse  --  3  MEWS RR  --  0  MEWS LOC  --  1  MEWS Score  --  5  MEWS Score Color  --  Red  Assess: if the MEWS score is Yellow or Red  Were vital signs accurate and taken at a resting state?  --  Yes   Does the patient meet 2 or more of the SIRS criteria?  --  Yes  Does the patient have a confirmed or suspected source of infection?  --  No  MEWS guidelines implemented   --  Yes, red  Treat  MEWS Interventions  --  Considered administering scheduled or prn medications/treatments as ordered  Take Vital Signs  Increase Vital Sign Frequency   --  Red: Q1hr x2, continue Q4hrs until patient remains green for 12hrs  Escalate  MEWS: Escalate  --  Red: Discuss with charge nurse and notify provider. Consider notifying RRT. If remains red for 2 hours consider need for higher level of care  Provider Notification  Provider Name/Title Garnette Pelt, MD  --   Date Provider Notified 09/29/24  --   Time Provider Notified 1137  --   Method of Notification Page (epic chat)  --   Notification Reason New onset of dysrhythmia;Other (Comment) (and RED MEWS)  --   Provider response At bedside;See new orders  --   Date of Provider Response 09/29/24  --   Time of Provider Response 1155  --   Assess: SIRS CRITERIA  SIRS Temperature   --  1  SIRS Respirations   --  0  SIRS Pulse  --  1  SIRS WBC  --  0  SIRS Score Sum   --  2

## 2024-09-29 NOTE — Progress Notes (Signed)
 PT Cancellation Note  Patient Details Name: Chris Mercer MRN: [id number] DOB: 11-13-1946   Cancelled Treatment:    Reason Eval/Treat Not Completed: Patient not medically ready.  Will try back 10/3. 09/29/2024  Chris HERO., PT Acute Rehabilitation Services [phone]  (office)   Chris Mercer 09/29/2024, 6:52 PM

## 2024-09-29 NOTE — Progress Notes (Signed)
 MD Cindy aware of patients LOC to pain only. Patient is arousable to pain but lethargic. Patient still hypotensive. Will administer another 1L LR bolus per MD. Potential to move to higher level of care. Per MD continue to monitor.

## 2024-09-29 NOTE — Progress Notes (Signed)
 PHARMACY - ANTICOAGULATION CONSULT NOTE  Pharmacy Consult:  Heparin  Indication: atrial fibrillation  Allergies  Allergen Reactions   Azithromycin Anaphylaxis, Swelling and Hives   Fenoprofen Anaphylaxis   Duloxetine Diarrhea    With dark stools       Patient Measurements: Height: 6' (182.9 cm) Weight: 82.7 kg (182 lb 5.1 oz) IBW/kg (Calculated) : 77.6  Vital Signs: Temp: 97.5 F (36.4 C) (10/02 1323) Temp Source: Axillary (10/02 1323) BP: 99/86 (10/02 1500) Pulse Rate: 107 (10/02 1323)  Labs: Recent Labs    09/28/24 1421 09/28/24 1753 09/28/24 2214 09/29/24 0616 09/29/24 0827  HGB 12.8*  --  14.1  --  13.9  HCT 38.1*  --  40.0  --  40.0  PLT 252  --  249  --  204  LABPROT 17.5*  --   --  15.2  --   INR 1.4*  --   --  1.1  --   CREATININE 0.85  --  0.97  --  0.92  TROPONINIHS 116* 142*  --   --   --     Estimated Creatinine Clearance: 75 mL/min (by C-G formula based on SCr of 0.92 mg/dL).   Medical History: Past Medical History:  Diagnosis Date   Anxiety    Aortic atherosclerosis    CAD (coronary artery disease)    a.) MPI 04/16/2021 --> LVEF 71%. Large severe perferion defect in the apex, apicolateral, mid inferolateral, and basal inferolateral location; no RWMAs; ischemia in LCx territory. b.) CCTA 04/25/2021 --> CCS/Agatston score 684.7.   Chronic pain syndrome    CKD (chronic kidney disease), stage III (HCC)    COPD (chronic obstructive pulmonary disease) (HCC)    Depression    Diabetic neuropathy (HCC)    GERD (gastroesophageal reflux disease)    Hepatic steatosis    Hiatal hernia    History of heart artery stent 2002   a.) type and location unknown   HLD (hyperlipidemia)    Hypertension    LAFB (left anterior fascicular block)    LVH (left ventricular hypertrophy)    OSA (obstructive sleep apnea)    a.) PSG 12/14/2019 --> AHI 6. b.) cannot tolerate nocturnal PAP therapy d/t COPD and significant GERD.   Osteoarthritis    Right middle lobe  pulmonary nodule 03/26/2019   a.) CT 03/25/2021 - measured 2.5 mm.   T2DM (type 2 diabetes mellitus) (HCC)    Umbilical hernia    s/p repair     Assessment: 63 YOM presented with possible sepsis.  Now with new Afib RVR and Pharmacy consulted to dose IV heparin  (CHADSVASC 5).  CBC stable, no bleeding reported.  Prophylactic Lovenox  given around 1100.  Goal of Therapy:  Heparin  level 0.3-0.7 units/ml Monitor platelets by anticoagulation protocol: Yes   Plan:  D/C Lovenox  Start heparin  gtt at 1150 units/hr Check 8 hr heparin  level Daily heparin  level and CBC  Chris Mercer D. [place], PharmD, BCPS, BCCCP 09/29/2024, 4:24 PM

## 2024-09-29 NOTE — Evaluation (Signed)
 Speech Language Pathology Evaluation Patient Details Name: Chris Mercer MRN: [id number] DOB: 1946/08/09 Today's Date: 09/29/2024 Time: 8963-8944 SLP Time Calculation (min) (ACUTE ONLY): 19 min  Problem List:  Patient Active Problem List   Diagnosis Date Noted   Sepsis (HCC) 09/28/2024   S/P repair of paraesophageal hernia 09/26/2021   Difficulty walking 06/05/2021   Tremor 06/05/2021   Edema of lower extremity 02/11/2021   Stage 3a chronic kidney disease (HCC) 02/11/2021   Acute renal failure syndrome 07/11/2020   Chronic kidney disease due to hypertension 07/11/2020   Hyperlipidemia 03/26/2019   Pressure injury of skin 03/23/2019   Medication-induced delirium, acute, mixed level of activity 03/21/2019   Depression 03/21/2019   Anxiety 03/21/2019   Flu-like symptoms    Generalized weakness    Altered mental status 03/18/2019   Neuropathy 03/18/2019   Essential hypertension 03/18/2019   Type 2 diabetes mellitus (HCC) 03/18/2019   Chronic pain syndrome 03/18/2019   Encounter for general adult medical examination without abnormal findings 04/29/2018   Sensory ataxia 03/09/2018   Hyperreflexia 02/02/2018   Other amnesia 11/16/2017   Past Medical History:  Past Medical History:  Diagnosis Date   Anxiety    Aortic atherosclerosis    CAD (coronary artery disease)    a.) MPI 04/16/2021 --> LVEF 71%. Large severe perferion defect in the apex, apicolateral, mid inferolateral, and basal inferolateral location; no RWMAs; ischemia in LCx territory. b.) CCTA 04/25/2021 --> CCS/Agatston score 684.7.   Chronic pain syndrome    CKD (chronic kidney disease), stage III (HCC)    COPD (chronic obstructive pulmonary disease) (HCC)    Depression    Diabetic neuropathy (HCC)    GERD (gastroesophageal reflux disease)    Hepatic steatosis    Hiatal hernia    History of heart artery stent 2002   a.) type and location unknown   HLD (hyperlipidemia)    Hypertension    LAFB (left anterior  fascicular block)    LVH (left ventricular hypertrophy)    OSA (obstructive sleep apnea)    a.) PSG 12/14/2019 --> AHI 6. b.) cannot tolerate nocturnal PAP therapy d/t COPD and significant GERD.   Osteoarthritis    Right middle lobe pulmonary nodule 03/26/2019   a.) CT 03/25/2021 - measured 2.5 mm.   T2DM (type 2 diabetes mellitus) (HCC)    Umbilical hernia    s/p repair   Past Surgical History:  Past Surgical History:  Procedure Laterality Date   AMPUTATION TOE     APPENDECTOMY     CORONARY ANGIOPLASTY WITH STENT PLACEMENT     ESOPHAGOGASTRODUODENOSCOPY (EGD) WITH PROPOFOL  N/A 09/05/2021   Procedure: ESOPHAGOGASTRODUODENOSCOPY (EGD) WITH PROPOFOL ;  Surgeon: Onita Elspeth Sharper, DO;  Location: [facility] ENDOSCOPY;  Service: Endoscopy;  Laterality: N/A;   HERNIA REPAIR     x3   INSERTION OF MESH  09/26/2021   Procedure: INSERTION OF MESH;  Surgeon: Jordis Laneta FALCON, MD;  Location: [facility] ORS;  Service: General;;   XI ROBOTIC ASSISTED PARAESOPHAGEAL HERNIA REPAIR N/A 09/26/2021   Procedure: XI ROBOTIC ASSISTED PARAESOPHAGEAL HERNIA REPAIR, RNFA to assist;  Surgeon: Jordis Laneta FALCON, MD;  Location: [facility] ORS;  Service: General;  Laterality: N/A;   HPI:  Chris Mercer is a 78 y.o. male who presented to ED from home with confusion, N/V. Dx sepsis, acute metabolic encephalopathy, possible pna. PMHx COPD, coronary artery disease, chronic pain syndrome, GERD, hyperlipidemia, essential hypertension, obstructive sleep apnea, depression.   Assessment / Plan / Recommendation Clinical Impression  Pt remains encephalopathic  but is improving, frequently apologizing for being sleepy. He is alert, oriented to person, place and some elements of time; disoriented to situation.  Long-term recall intact. Speech is clear, coherent, and fluent. Expressive and receptive language is [facility]. Mentation should improve as sepsis clears. No acute care SLP f/u is needed. Our service will sign off.    SLP Assessment  SLP  Recommendation/Assessment: Patient does not need any further Speech Language Pathology Services SLP Visit Diagnosis: Cognitive communication deficit (R41.841)                   SLP Evaluation Cognition  Overall Cognitive Status: Impaired/Different from baseline Arousal/Alertness: Awake/alert Orientation Level: Oriented to person;Oriented to place;Disoriented to time;Disoriented to situation Attention: Sustained Sustained Attention: Appears intact Memory: Impaired Memory Impairment: Storage deficit Awareness:  (improving) Behaviors:  (calm)       Comprehension  Auditory Comprehension Overall Auditory Comprehension: Appears within functional limits for tasks assessed Visual Recognition/Discrimination Discrimination: Not tested    Expression Expression Primary Mode of Expression: Verbal Verbal Expression Overall Verbal Expression: Appears within functional limits for tasks assessed Written Expression Dominant Hand: Right   Oral / Motor  Oral Motor/Sensory Function Overall Oral Motor/Sensory Function: Within functional limits Motor Speech Overall Motor Speech: Appears within functional limits for tasks assessed            Vona Palma Laurice 09/29/2024, 11:00 AM Palma L. Vona, MA CCC/SLP Clinical Specialist - Acute Care SLP Acute Rehabilitation Services Office number [phone]

## 2024-09-29 NOTE — Progress Notes (Signed)
 Post-RED MEWS Implementation vitals (Q1hr x2)   09/29/24 1228 09/29/24 1323  Assess: MEWS Score  Temp 97.8 F (36.6 C) (!) 97.5 F (36.4 C)  BP (!) 140/78 91/72  MAP (mmHg) 94 80  Pulse Rate 83 (!) 107  ECG Heart Rate (!) 147 (!) 124  Resp 20 17  SpO2 96 %  --   O2 Device Room Air  --   Assess: MEWS Score  MEWS Temp 0 0  MEWS Systolic 0 1  MEWS Pulse 3 2  MEWS RR 0 0  MEWS LOC 1 1  MEWS Score 4 4  MEWS Score Color Red Red  Assess: SIRS CRITERIA  SIRS Temperature  0 0  SIRS Respirations  0 0  SIRS Pulse 1 1  SIRS WBC 0 0  SIRS Score Sum  1 1

## 2024-09-29 NOTE — Progress Notes (Signed)
 Patient having new onset Afib RVR; HR sustaining 130-150s. MD Garnette Pelt notified. 12-lead EKG ordered STAT. Will obtain.

## 2024-09-29 NOTE — Hospital Course (Signed)
 78 y.o. male with medical history significant of COPD, coronary artery disease, chronic pain syndrome, GERD, hyperlipidemia, essential hypertension, obstructive sleep apnea, depression, who presents to the ER via EMS from home where his son called out saying patient is confused and having nausea vomiting and weakness for the last few days.  There is also new onset atrial fibrillation.  He is usually awake alert oriented but today he is only alert and oriented x 2.  Patient was seen in the emergency room with initial evaluation done showing normal sinus rhythm on his EKG, patient however has elevated troponin up to 116.  Repeat troponin is currently pending.  Initial lactic acid level was more than six 7.6 with repeat dropping to 4.6.  He has mildly elevated BNP has a white count and negative viral panel.  Suspicion for early sepsis of unknown source.  Patient initiated on antibiotics and hydrated

## 2024-09-29 NOTE — Progress Notes (Signed)
 Unable to give PRN labetalol d/t BP 92/55. MD aware. 500 mL bolus LR being administered.

## 2024-09-29 NOTE — Progress Notes (Signed)
 Progress Note   Patient: Chris Mercer FMW:[id number] DOB: 07/05/1946 DOA: 09/28/2024     1 DOS: the patient was seen and examined on 09/29/2024   Brief hospital course: 78 y.o. male with medical history significant of COPD, coronary artery disease, chronic pain syndrome, GERD, hyperlipidemia, essential hypertension, obstructive sleep apnea, depression, who presents to the ER via EMS from home where his son called out saying patient is confused and having nausea vomiting and weakness for the last few days.  There is also new onset atrial fibrillation.  He is usually awake alert oriented but today he is only alert and oriented x 2.  Patient was seen in the emergency room with initial evaluation done showing normal sinus rhythm on his EKG, patient however has elevated troponin up to 116.  Repeat troponin is currently pending.  Initial lactic acid level was more than six 7.6 with repeat dropping to 4.6.  He has mildly elevated BNP has a white count and negative viral panel.  Suspicion for early sepsis of unknown source.  Patient initiated on antibiotics and hydrated   Assessment and Plan: #1 severe sepsis with lactic acidosis present on admit, unclear source:  -initially suspected pneumonia.   -ill defined density in RUL on CXR. F/u CT ordered and is pending -presented with lactate over 7, encephalopathy -continued on empiric cefepime, flagyl, vanc -Lactate normalized with IVF -f/u on cultures   #2 acute metabolic encephalopathy:  -CT head unremarkable -Suspect secondary to severe sepsis   #3 essential hypertension:  -hypotensive this afternoon, requiring multiple IVF boluses -stop norvasc  and lisinopril    #4 type 2 diabetes:  -cont SSI as needed   #5 depression with anxiety:  -Cont lexapro  per home regimen   #6 hyperlipidemia:  -Reportedly not taking crestor PTA, last dispensed 04/20/24   #7 lactic acidosis:  -normalized with IVF hydration   #8 normocytic anemia:  -follow cbc  trends   #9 elevated troponin:  -suspect demand ischemia related to severe sepsis  New onset afib with RVR -HR improved with trial of lopressor, however noted to be hypotensive later in the day requiring IVF bolus. Beta blocker held for now -2d echo reviewed. Afib noted. Normal LVEF with no WMA. Severe concentric LV hypertrophy -CHADS-VASc 5. Will start heparin  gtt for now, if tolerates, will plan to transition to eliquis  Fall -Will check B knee xrays  Hypokalemia -Will correct  Hypomagnesemia  -Correct -Recheck level in AM      Subjective: Difficult to assess given mentation  Physical Exam: Vitals:   09/29/24 1300 09/29/24 1323 09/29/24 1400 09/29/24 1500  BP: (!) 67/48 91/72 (!) 75/64 99/86  Pulse:  (!) 107    Resp:  17    Temp:  (!) 97.5 F (36.4 C)    TempSrc:  Axillary    SpO2:      Weight:      Height:       General exam: Awake, laying in bed Respiratory system: Normal respiratory effort, no wheezing Cardiovascular system: regular rate, s1, s2 Gastrointestinal system: Soft, nondistended, positive BS Central nervous system: CN2-12 grossly intact, strength intact Extremities: Perfused, no clubbing Skin: Normal skin turgor, no notable skin lesions seen Psychiatry: Mood normal // difficult to assess given mentation  Data Reviewed:  Labs reviewed: Na 140, K 2.8, Cr 0.92, Mg 1.4, WBC 6.4, Hgb 13.9, Plts 204  Family Communication: Pt in room, family not at bedside  Disposition: Status is: Inpatient Remains inpatient appropriate because: Severity of illness  Planned Discharge  Destination: Unclear at this time    Author: Garnette Pelt, MD 09/29/2024 4:17 PM  For on call review [email or web address].

## 2024-09-29 NOTE — Progress Notes (Signed)
   09/29/24 0304  Assess: MEWS Score  Temp 99 F (37.2 C)  BP (!) 172/101  MAP (mmHg) 123  Pulse Rate 64  ECG Heart Rate 71  Resp 20  SpO2 98 %  O2 Device Room Air  Assess: MEWS Score  MEWS Temp 0  MEWS Systolic 0  MEWS Pulse 0  MEWS RR 0  MEWS LOC 1  MEWS Score 1  MEWS Score Color Green  Provider Notification  Provider Name/Title [facility] (PROVEDER)  Date Provider Notified 09/29/24  Time Provider Notified 0320  Method of Notification Page  Notification Reason Change in status (Patient became comfuse and agressive)  Provider response Evaluate remotely;See new orders (Order HAldol )  Date of Provider Response 09/29/24  Time of Provider Response 0400  Assess: SIRS CRITERIA  SIRS Temperature  0  SIRS Respirations  0  SIRS Pulse 0  SIRS WBC 0  SIRS Score Sum  0

## 2024-09-29 NOTE — Consult Note (Signed)
 WOC Nurse Consult Note: Reason for Consult: skin tears B knees; dry skin  Wound type: full thickness B knees r/t trauma  Pressure Injury POA: NA  Measurement: see nursing flowsheet  Wound bed: red moist  Drainage (amount, consistency, odor) serosanguinous  Periwound: Dressing procedure/placement/frequency: Cleanse B knee skin tears with NS, apply Xeroform gauze (Lawson [id number]) to wound beds every day and secure with silicone foam or Kerlix roll gauze and tape whichever is preferred.  SOAK DRESSING WITH NORMAL SALINE IF ADHERED TO WOUND BEDS FOR ATRAUMATIC REMOVAL.   Will write for Eucerin cream 2 times daily for dry skin.   POC discussed with bedside nurse. Appreciate M. Mahlon Bolt, RN assistance with this consult.   Thank you,    Powell Bar MSN, RN-BC, [employer]

## 2024-09-29 NOTE — Progress Notes (Signed)
 Pharmacy Antibiotic Note  Chris Mercer is a 78 y.o. male for which pharmacy has been consulted for cefepime and vancomycin dosing for sepsis. Cr ~1.  Plan: Cefepime 2g Iv q8h Metronidazole 500mg  q12h Vancomycin 1000mg  IV q12h - est AUC 545  Height: 6' (182.9 cm) Weight: 82.7 kg (182 lb 5.1 oz) IBW/kg (Calculated) : 77.6  Temp (24hrs), Avg:98.4 F (36.9 C), Min:97.8 F (36.6 C), Max:99 F (37.2 C)  Recent Labs  Lab 09/28/24 1421 09/28/24 1424 09/28/24 1756 09/28/24 2214  WBC 10.8*  --   --  8.5  CREATININE 0.85  --   --  0.97  LATICACIDVEN  --  7.6* 4.6*  --     Estimated Creatinine Clearance: 71.1 mL/min (by C-G formula based on SCr of 0.97 mg/dL).    Allergies  Allergen Reactions   Azithromycin Anaphylaxis, Swelling and Hives   Fenoprofen Anaphylaxis   Duloxetine Diarrhea    With dark stools      Microbiology results: Pending  Thank you for allowing pharmacy to be a part of this patient's care.    Chris Mercer, PharmD, BCPS, [facility] Clinical Pharmacist [phone] Please check AMION for all [facility] Pharmacy numbers 09/29/2024

## 2024-09-29 NOTE — Plan of Care (Signed)
  Problem: Metabolic: Goal: Ability to maintain appropriate glucose levels will improve Outcome: Progressing   Problem: Tissue Perfusion: Goal: Adequacy of tissue perfusion will improve Outcome: Progressing   Problem: Respiratory: Goal: Ability to maintain adequate ventilation will improve Outcome: Progressing   Problem: Coping: Goal: Ability to adjust to condition or change in health will improve Outcome: Not Progressing   Problem: Fluid Volume: Goal: Ability to maintain a balanced intake and output will improve Outcome: Not Progressing   Problem: Fluid Volume: Goal: Hemodynamic stability will improve Outcome: Not Progressing

## 2024-09-29 NOTE — Plan of Care (Signed)
  Problem: Education: Goal: Ability to describe self-care measures that may prevent or decrease complications (Diabetes Survival Skills Education) will improve Outcome: Progressing Goal: Individualized Educational Video(s) Outcome: Progressing   Problem: Coping: Goal: Ability to adjust to condition or change in health will improve Outcome: Progressing   Problem: Fluid Volume: Goal: Ability to maintain a balanced intake and output will improve Outcome: Progressing   Problem: Health Behavior/Discharge Planning: Goal: Ability to identify and utilize available resources and services will improve Outcome: Progressing Goal: Ability to manage health-related needs will improve Outcome: Progressing   Problem: Metabolic: Goal: Ability to maintain appropriate glucose levels will improve Outcome: Progressing   Problem: Nutritional: Goal: Maintenance of adequate nutrition will improve Outcome: Progressing Goal: Progress toward achieving an optimal weight will improve Outcome: Progressing   Problem: Skin Integrity: Goal: Risk for impaired skin integrity will decrease Outcome: Progressing   Problem: Tissue Perfusion: Goal: Adequacy of tissue perfusion will improve Outcome: Progressing   Problem: Fluid Volume: Goal: Hemodynamic stability will improve Outcome: Progressing   Problem: Clinical Measurements: Goal: Diagnostic test results will improve Outcome: Progressing Goal: Signs and symptoms of infection will decrease Outcome: Progressing   Problem: Respiratory: Goal: Ability to maintain adequate ventilation will improve Outcome: Progressing   Problem: Education: Goal: Knowledge of General Education information will improve Description: Including pain rating scale, medication(s)/side effects and non-pharmacologic comfort measures Outcome: Progressing   Problem: Health Behavior/Discharge Planning: Goal: Ability to manage health-related needs will improve Outcome:  Progressing   Problem: Activity: Goal: Risk for activity intolerance will decrease Outcome: Progressing   Problem: Nutrition: Goal: Adequate nutrition will be maintained Outcome: Progressing   Problem: Coping: Goal: Level of anxiety will decrease Outcome: Progressing   Problem: Elimination: Goal: Will not experience complications related to bowel motility Outcome: Progressing Goal: Will not experience complications related to urinary retention Outcome: Progressing   Problem: Pain Managment: Goal: General experience of comfort will improve and/or be controlled Outcome: Progressing   Problem: Safety: Goal: Ability to remain free from injury will improve Outcome: Progressing   Problem: Skin Integrity: Goal: Risk for impaired skin integrity will decrease Outcome: Progressing

## 2024-09-30 ENCOUNTER — Telehealth (HOSPITAL_COMMUNITY): Payer: Self-pay | Admitting: Pharmacy Technician

## 2024-09-30 ENCOUNTER — Other Ambulatory Visit (HOSPITAL_COMMUNITY): Payer: Self-pay

## 2024-09-30 DIAGNOSIS — R1084 Generalized abdominal pain: Secondary | ICD-10-CM | POA: Diagnosis not present

## 2024-09-30 DIAGNOSIS — S63259A Unspecified dislocation of unspecified finger, initial encounter: Secondary | ICD-10-CM | POA: Diagnosis not present

## 2024-09-30 DIAGNOSIS — R4182 Altered mental status, unspecified: Secondary | ICD-10-CM | POA: Diagnosis not present

## 2024-09-30 LAB — BASIC METABOLIC PANEL WITH GFR
Anion gap: 7 (ref 5–15)
BUN: 11 mg/dL (ref 8–23)
CO2: 25 mmol/L (ref 22–32)
Calcium: 8.6 mg/dL — ABNORMAL LOW (ref 8.9–10.3)
Chloride: 110 mmol/L (ref 98–111)
Creatinine, Ser: 1.01 mg/dL (ref 0.61–1.24)
GFR, Estimated: >60 mL/min (ref >60)
Glucose, Bld: 131 mg/dL — ABNORMAL HIGH (ref 70–99)
Potassium: 3.3 mmol/L — ABNORMAL LOW (ref 3.5–5.1)
Sodium: 142 mmol/L (ref 135–145)

## 2024-09-30 LAB — CBC
HCT: 34.8 % — ABNORMAL LOW (ref 39.0–52.0)
Hemoglobin: 12.1 g/dL — ABNORMAL LOW (ref 13.0–17.0)
MCH: 31.9 pg (ref 26.0–34.0)
MCHC: 34.8 g/dL (ref 30.0–36.0)
MCV: 91.8 fL (ref 80.0–100.0)
Platelets: 168 K/uL (ref 150–400)
RBC: 3.79 MIL/uL — ABNORMAL LOW (ref 4.22–5.81)
RDW: 15.7 % — ABNORMAL HIGH (ref 11.5–15.5)
WBC: 5 K/uL (ref 4.0–10.5)
nRBC: 0 % (ref 0.0–0.2)

## 2024-09-30 LAB — HEPARIN LEVEL (UNFRACTIONATED): Heparin Unfractionated: 0.55 [IU]/mL (ref 0.30–0.70)

## 2024-09-30 LAB — GLUCOSE, CAPILLARY
Glucose-Capillary: 105 mg/dL — ABNORMAL HIGH (ref 70–99)
Glucose-Capillary: 123 mg/dL — ABNORMAL HIGH (ref 70–99)
Glucose-Capillary: 141 mg/dL — ABNORMAL HIGH (ref 70–99)
Glucose-Capillary: 100 mg/dL — ABNORMAL HIGH (ref 70–99)

## 2024-09-30 LAB — MAGNESIUM: Magnesium: 2.2 mg/dL (ref 1.7–2.4)

## 2024-09-30 MED ORDER — AMOXICILLIN-POT CLAVULANATE 875-125 MG PO TABS
1.0000 | ORAL_TABLET | Freq: Two times a day (BID) | ORAL | Status: DC
  Administered 2024-09-30 – 2024-10-03 (×6): 1 via ORAL
  Filled 2024-09-30 (×6): qty 1

## 2024-09-30 MED ORDER — SODIUM CHLORIDE 0.9 % IV SOLN: INTRAVENOUS | Status: AC | PRN

## 2024-09-30 MED ORDER — APIXABAN 5 MG PO TABS
5.0000 mg | ORAL_TABLET | Freq: Two times a day (BID) | ORAL | Status: DC
  Administered 2024-09-30 – 2024-10-03 (×6): 5 mg via ORAL
  Filled 2024-09-30 (×6): qty 1

## 2024-09-30 MED ORDER — POLYETHYLENE GLYCOL 3350 17 G PO PACK
17.0000 g | PACK | Freq: Two times a day (BID) | ORAL | Status: DC
  Administered 2024-09-30 – 2024-10-03 (×7): 17 g via ORAL
  Filled 2024-09-30 (×7): qty 1

## 2024-09-30 MED ORDER — POTASSIUM CHLORIDE CRYS ER 20 MEQ PO TBCR
60.0000 meq | EXTENDED_RELEASE_TABLET | Freq: Once | ORAL | Status: AC
  Administered 2024-09-30: 60 meq via ORAL
  Filled 2024-09-30: qty 3

## 2024-09-30 MED ORDER — DOXYCYCLINE HYCLATE 100 MG PO TABS
100.0000 mg | ORAL_TABLET | Freq: Two times a day (BID) | ORAL | Status: DC
  Administered 2024-09-30 – 2024-10-03 (×6): 100 mg via ORAL
  Filled 2024-09-30 (×6): qty 1

## 2024-09-30 NOTE — Progress Notes (Signed)
 Patient's rectal temp is 97.3 [place] MD notified

## 2024-09-30 NOTE — TOC Initial Note (Addendum)
 Transition of Care [facility]) - Initial/Assessment Note    Patient Details  Name: Chris Mercer MRN: [id number] Date of Birth: 08/18/1946  Transition of [facility] CM/SW Contact:    Isaiah Public, LCSWA Phone Number: 09/30/2024, 4:22 PM  Clinical Narrative:                  CSW received consult for possible SNF placement at time of discharge. CSW spoke with patient at bedside regarding PT recommendation of SNF placement at time of discharge. Patient reports PTA he comes from home alone. Patient expressed understanding of PT recommendation and is agreeable to SNF placement at time of discharge. Patient gave CSW permission to fax out initial referral for SNF placement.CSW discussed insurance authorization process and will provide Medicare SNF ratings list with accepted SNF bed offers when available. All questions answered. No further questions reported at this time. Patients passr is pending. CSW awaiting 30 day note and FL2 to be cosigned by MD, then CSW will submit requested clinicals to [place] must for review.Patient gave permission for CSW to follow up with his son Jenetta.CSW LVM for patients son Jenetta. CSW awaiting call back to provide update on patients PT recs/dc plan.CSW to continue to follow and assist with discharge planning needs.    Expected Discharge Plan: Skilled Nursing Facility Barriers to Discharge: Continued Medical Work up   Patient Goals and CMS Choice Patient states their goals for this hospitalization and ongoing recovery are:: to go to SNF   Choice offered to / list presented to : Patient      Expected Discharge Plan and Services In-house Referral: Clinical Social Work     Living arrangements for the past 2 months: Apartment                                      Prior Living Arrangements/Services Living arrangements for the past 2 months: Apartment Lives with:: Self Patient language and need for interpreter reviewed:: Yes Do you feel safe going back to the  place where you live?: No   SNF  Need for Family Participation in Patient Care: Yes (Comment) Care giver support system in place?: Yes (comment)   Criminal Activity/Legal Involvement Pertinent to Current Situation/Hospitalization: No - Comment as needed  Activities of Daily Living   ADL Screening (condition at time of admission) Independently performs ADLs?: No Does the patient have a NEW difficulty with bathing/dressing/toileting/self-feeding that is expected to last >3 days?: Yes (Initiates electronic notice to provider for possible OT consult) Does the patient have a NEW difficulty with getting in/out of bed, walking, or climbing stairs that is expected to last >3 days?: Yes (Initiates electronic notice to provider for possible PT consult) Does the patient have a NEW difficulty with communication that is expected to last >3 days?: Yes (Initiates electronic notice to provider for possible SLP consult) Is the patient deaf or have difficulty hearing?: No Does the patient have difficulty seeing, even when wearing glasses/contacts?: No Does the patient have difficulty concentrating, remembering, or making decisions?: No  Permission Sought/Granted Permission sought to share information with : Case Manager, Magazine features editor, Family Supports Permission granted to share information with : Yes, Verbal Permission Granted  Share Information with NAME: Jenetta  Permission granted to share info w AGENCY: SNF  Permission granted to share info w Relationship: son  Permission granted to share info w Contact Information: Jenetta [phone]  Emotional Assessment  Appearance:: Appears stated age Attitude/Demeanor/Rapport: Gracious Affect (typically observed): Calm Orientation: : Oriented to Self, Oriented to  Time, Oriented to Situation Alcohol / Substance Use: Not Applicable Psych Involvement: No (comment)  Admission diagnosis:  Generalized abdominal pain [R10.84] Dislocation of finger,  initial encounter [S63.259A] Sepsis (HCC) [A41.9] Altered mental status, unspecified altered mental status type [R41.82] Nausea and vomiting, unspecified vomiting type [R11.2] Patient Active Problem List   Diagnosis Date Noted   Sepsis (HCC) 09/28/2024   S/P repair of paraesophageal hernia 09/26/2021   Difficulty walking 06/05/2021   Tremor 06/05/2021   Edema of lower extremity 02/11/2021   Stage 3a chronic kidney disease (HCC) 02/11/2021   Acute renal failure syndrome 07/11/2020   Chronic kidney disease due to hypertension 07/11/2020   Hyperlipidemia 03/26/2019   Pressure injury of skin 03/23/2019   Medication-induced delirium, acute, mixed level of activity 03/21/2019   Depression 03/21/2019   Anxiety 03/21/2019   Flu-like symptoms    Generalized weakness    Altered mental status 03/18/2019   Neuropathy 03/18/2019   Essential hypertension 03/18/2019   Type 2 diabetes mellitus (HCC) 03/18/2019   Chronic pain syndrome 03/18/2019   Encounter for general adult medical examination without abnormal findings 04/29/2018   Sensory ataxia 03/09/2018   Hyperreflexia 02/02/2018   Other amnesia 11/16/2017   PCP:  Glennon Sand, NP (Inactive) Pharmacy:   [facility] [id number] - [place], [place] - [address] [place] [address] Phone: [phone] Fax: [phone]  [facility] [id number] - [place], [place] - [address] [place] [address] Phone: [phone] Fax: [phone]     Social Drivers of Health (SDOH) Social History: SDOH Screenings   Food Insecurity: No Food Insecurity (09/28/2024)  Housing: Low Risk  (09/28/2024)  Transportation Needs: No Transportation Needs (09/28/2024)  Utilities: Not At Risk (09/28/2024)  Social Connections: Unknown (09/29/2024)  Tobacco Use: Medium Risk (09/28/2024)   SDOH Interventions:     Readmission Risk Interventions     No data to display

## 2024-09-30 NOTE — Evaluation (Signed)
 Physical Therapy Evaluation Patient Details Name: Chris Mercer MRN: [id number] DOB: January 31, 1946 Today's Date: 09/30/2024  History of Present Illness  Pt is 78 yo presenting to [facility] on 10/1 due to son calling EMS secondary to fatigue, malaise, nauseas without vomiting, chills. PMH: HTN, DM II, CKD, HLD.  Clinical Impression  Pt is currently presenting at Min A for bed mobility, Mod A for sit to stand and Min A for transfers with RW. Pt was unable to progress gait due to weakness. Son was present and supportive. Due to pt current functional status, home set up and available assistance at home recommending skilled physical therapy services < 3 hours/day in order to address strength, balance and functional mobility to decrease risk for falls, injury, immobility, skin break down and re-hospitalization.          If plan is discharge home, recommend the following: Assistance with cooking/housework;Assist for transportation;Supervision due to cognitive status     Equipment Recommendations Wheelchair cushion (measurements PT);BSC/3in1;Wheelchair (measurements PT)     Functional Status Assessment Patient has had a recent decline in their functional status and demonstrates the ability to make significant improvements in function in a reasonable and predictable amount of time.     Precautions / Restrictions Precautions Precautions: Fall Recall of Precautions/Restrictions: Intact Restrictions Weight Bearing Restrictions Per Provider Order: No      Mobility  Bed Mobility Overal bed mobility: Needs Assistance Bed Mobility: Supine to Sit     Supine to sit: Min assist, HOB elevated, Used rails     General bed mobility comments: Min A with tactile cues to initiate and then to get LE to EOB. Pt able to progress trunk to mid line with HOB elevated at [facility]    Transfers Overall transfer level: Needs assistance Equipment used: Rolling walker (2 wheels) Transfers: Sit to/from Stand, Bed to  chair/wheelchair/BSC Sit to Stand: Mod assist   Step pivot transfers: Min assist       General transfer comment: Pt initially is able to get 75% up to standing with Min A but then last 25% requires Mod A to get hands fully on Walker and extend hips into fully upright for both balance/weakness. Pt was Min A for stepping from EOB to recliner    Ambulation/Gait   Pre-gait activities: STeps from EOB to recliner with dragging the feet; did not fully clear floor with either foot. Min A Heavy verbal cues.       Balance Overall balance assessment: Needs assistance Sitting-balance support: Bilateral upper extremity supported, Feet supported Sitting balance-Leahy Scale: Fair     Standing balance support: Bilateral upper extremity supported, During functional activity, Reliant on assistive device for balance Standing balance-Leahy Scale: Poor Standing balance comment: MIn A  to Mod A in standing. Pt with heavy UE support.       Pertinent Vitals/Pain Pain Assessment Pain Assessment: No/denies pain    Home Living Family/patient expects to be discharged to:: Private residence Living Arrangements: Alone Available Help at Discharge: Family;Available PRN/intermittently Type of Home: Apartment Home Access: Level entry       Home Layout: One level Home Equipment: Grab bars - tub/shower;Rolling Walker (2 wheels)      Prior Function Prior Level of Function : Independent/Modified Independent             Mobility Comments: Pt normally ambulates without an AD. ADLs Comments: Pt ind with ADLs. Able to drive but does not drive. (son assisted with history)     Extremity/Trunk Assessment  Upper Extremity Assessment Upper Extremity Assessment: Defer to OT evaluation    Lower Extremity Assessment Lower Extremity Assessment: Generalized weakness    Cervical / Trunk Assessment Cervical / Trunk Assessment: Kyphotic  Communication   Communication Communication: No apparent  difficulties    Cognition Arousal: Alert Behavior During Therapy: WFL for tasks assessed/performed   PT - Cognitive impairments: Orientation   Orientation impairments: Time, Place   PT - Cognition Comments: Pt knows who he is and that he is in the hospital. Confused about date and situation. Knows it is 2025 Following commands: Intact             General Comments General comments (skin integrity, edema, etc.): O2 sats with difficulty getting reading in standing 92% in sitting after transfer.        Assessment/Plan    PT Assessment Patient needs continued PT services  PT Problem List Decreased strength;Decreased balance;Decreased activity tolerance;Decreased mobility       PT Treatment Interventions DME instruction;Therapeutic activities;Gait training;Therapeutic exercise;Functional mobility training;Balance training;Neuromuscular re-education;Patient/family education    PT Goals (Current goals can be found in the Care Plan section)  Acute Rehab PT Goals Patient Stated Goal: to get stronger before going home. PT Goal Formulation: With patient Time For Goal Achievement: 10/14/24 Potential to Achieve Goals: Good    Frequency Min 1X/week        AM-PAC PT 6 Clicks Mobility  Outcome Measure Help needed turning from your back to your side while in a flat bed without using bedrails?: A Little Help needed moving from lying on your back to sitting on the side of a flat bed without using bedrails?: A Little Help needed moving to and from a bed to a chair (including a wheelchair)?: A Lot Help needed standing up from a chair using your arms (e.g., wheelchair or bedside chair)?: A Lot Help needed to walk in hospital room?: Total Help needed climbing 3-5 steps with a railing? : Total 6 Click Score: 12    End of Session Equipment Utilized During Treatment: Gait belt Activity Tolerance: Patient tolerated treatment well Patient left: in chair;with chair alarm set;with call  bell/phone within reach;with family/visitor present Nurse Communication: Mobility status PT Visit Diagnosis: Unsteadiness on feet (R26.81);Other abnormalities of gait and mobility (R26.89);Muscle weakness (generalized) (M62.81)    Time: 8773-8747 PT Time Calculation (min) (ACUTE ONLY): 26 min   Charges:   PT Evaluation $PT Eval Low Complexity: 1 Low PT Treatments $Therapeutic Activity: 8-22 mins PT General Charges $$ ACUTE PT VISIT: 1 Visit        Dorothyann Maier, DPT, CLT  Acute Rehabilitation Services Office: [phone] (Secure chat preferred)   Dorothyann VEAR Maier 09/30/2024, 1:06 PM

## 2024-09-30 NOTE — Progress Notes (Signed)
 PHARMACY - ANTICOAGULATION CONSULT NOTE  Pharmacy Consult:  Heparin  Indication: atrial fibrillation  Allergies  Allergen Reactions   Azithromycin Anaphylaxis, Swelling and Hives   Fenoprofen Anaphylaxis   Duloxetine Diarrhea    With dark stools       Patient Measurements: Height: 6' (182.9 cm) Weight: 82.7 kg (182 lb 5.1 oz) IBW/kg (Calculated) : 77.6  Vital Signs: Temp: 97.5 F (36.4 C) (10/02 2355) Temp Source: Rectal (10/02 2355) BP: 99/63 (10/02 2355) Pulse Rate: 58 (10/02 2355)  Labs: Recent Labs    09/28/24 1421 09/28/24 1753 09/28/24 2214 09/29/24 0616 09/29/24 0827 09/30/24 0208  HGB 12.8*  --  14.1  --  13.9 12.1*  HCT 38.1*  --  40.0  --  40.0 34.8*  PLT 252  --  249  --  204 168  LABPROT 17.5*  --   --  15.2  --   --   INR 1.4*  --   --  1.1  --   --   HEPARINUNFRC  --   --   --   --   --  0.55  CREATININE 0.85  --  0.97  --  0.92 1.01  TROPONINIHS 116* 142*  --   --   --   --     Estimated Creatinine Clearance: 68.3 mL/min (by C-G formula based on SCr of 1.01 mg/dL).   Medical History: Past Medical History:  Diagnosis Date   Anxiety    Aortic atherosclerosis    CAD (coronary artery disease)    a.) MPI 04/16/2021 --> LVEF 71%. Large severe perferion defect in the apex, apicolateral, mid inferolateral, and basal inferolateral location; no RWMAs; ischemia in LCx territory. b.) CCTA 04/25/2021 --> CCS/Agatston score 684.7.   Chronic pain syndrome    CKD (chronic kidney disease), stage III (HCC)    COPD (chronic obstructive pulmonary disease) (HCC)    Depression    Diabetic neuropathy (HCC)    GERD (gastroesophageal reflux disease)    Hepatic steatosis    Hiatal hernia    History of heart artery stent 2002   a.) type and location unknown   HLD (hyperlipidemia)    Hypertension    LAFB (left anterior fascicular block)    LVH (left ventricular hypertrophy)    OSA (obstructive sleep apnea)    a.) PSG 12/14/2019 --> AHI 6. b.) cannot tolerate  nocturnal PAP therapy d/t COPD and significant GERD.   Osteoarthritis    Right middle lobe pulmonary nodule 03/26/2019   a.) CT 03/25/2021 - measured 2.5 mm.   T2DM (type 2 diabetes mellitus) (HCC)    Umbilical hernia    s/p repair     Assessment: 37 YOM presented with possible sepsis.  Now with new Afib RVR and Pharmacy consulted to dose IV heparin  (CHADSVASC 5).  CBC stable, no bleeding reported.  Prophylactic Lovenox  given around 1100.  10/3 AM update:  Heparin  level therapeutic  Goal of Therapy:  Heparin  level 0.3-0.7 units/ml Monitor platelets by anticoagulation protocol: Yes   Plan:  Cont heparin  at 1150 units/hr Check 8 hr heparin  level  Lynwood Mckusick, PharmD, BCPS Clinical Pharmacist Phone: [phone]

## 2024-09-30 NOTE — TOC PASRR Note (Signed)
 [facility] IP TOC PASRR NOTE  RE: Chris Mercer  Date of Birth: June 07, 1946  Date 09/30/2024    To Whom It May Concern:   Please be advised that the above-named patient will require a short-term nursing home stay - anticipated 30 days or less for rehabilitation and strengthening. The plan is for return home.

## 2024-09-30 NOTE — Plan of Care (Signed)
  Problem: Clinical Measurements: Goal: Diagnostic test results will improve Outcome: Progressing   Problem: Respiratory: Goal: Ability to maintain adequate ventilation will improve Outcome: Progressing   Problem: Education: Goal: Knowledge of General Education information will improve Description: Including pain rating scale, medication(s)/side effects and non-pharmacologic comfort measures Outcome: Progressing   Problem: Health Behavior/Discharge Planning: Goal: Ability to manage health-related needs will improve Outcome: Progressing   Problem: Clinical Measurements: Goal: Ability to maintain clinical measurements within normal limits will improve Outcome: Progressing Goal: Diagnostic test results will improve Outcome: Progressing Goal: Respiratory complications will improve Outcome: Progressing Goal: Cardiovascular complication will be avoided Outcome: Progressing   Problem: Nutrition: Goal: Adequate nutrition will be maintained Outcome: Progressing   Problem: Coping: Goal: Level of anxiety will decrease Outcome: Progressing   Problem: Elimination: Goal: Will not experience complications related to urinary retention Outcome: Progressing   Problem: Pain Managment: Goal: General experience of comfort will improve and/or be controlled Outcome: Progressing   Problem: Safety: Goal: Ability to remain free from injury will improve Outcome: Progressing   Problem: Skin Integrity: Goal: Risk for impaired skin integrity will decrease Outcome: Progressing   Problem: Safety: Goal: Non-violent Restraint(s) Outcome: Not Applicable

## 2024-09-30 NOTE — NC FL2 (Signed)
 [place]  MEDICAID FL2 LEVEL OF CARE FORM     IDENTIFICATION  Patient Name: Chris Mercer Birthdate: [birth year] Sex: male Admission Date (Current Location): 09/28/2024  [facility] and [place] Number:  Producer, television/film/video and Address:  [facility], [address], [place], [place] [address]      Provider Number: [id number]  Attending Physician Name and Address:  Cindy Garnette POUR, MD  Relative Name and Phone Number:  Vardaan Depascale (son) [phone]    Current Level of Care: Hospital Recommended Level of Care: Skilled Nursing Facility Prior Approval Number:    Date Approved/Denied:   PASRR Number: PASRR under review  Discharge Plan: SNF    Current Diagnoses: Patient Active Problem List   Diagnosis Date Noted   Sepsis (HCC) 09/28/2024   S/P repair of paraesophageal hernia 09/26/2021   Difficulty walking 06/05/2021   Tremor 06/05/2021   Edema of lower extremity 02/11/2021   Stage 3a chronic kidney disease (HCC) 02/11/2021   Acute renal failure syndrome 07/11/2020   Chronic kidney disease due to hypertension 07/11/2020   Hyperlipidemia 03/26/2019   Pressure injury of skin 03/23/2019   Medication-induced delirium, acute, mixed level of activity 03/21/2019   Depression 03/21/2019   Anxiety 03/21/2019   Flu-like symptoms    Generalized weakness    Altered mental status 03/18/2019   Neuropathy 03/18/2019   Essential hypertension 03/18/2019   Type 2 diabetes mellitus (HCC) 03/18/2019   Chronic pain syndrome 03/18/2019   Encounter for general adult medical examination without abnormal findings 04/29/2018   Sensory ataxia 03/09/2018   Hyperreflexia 02/02/2018   Other amnesia 11/16/2017    Orientation RESPIRATION BLADDER Height & Weight     Self, Time, Situation  Normal Incontinent, External catheter (External Urinary Catheter) Weight: 182 lb 5.1 oz (82.7 kg) Height:  6' (182.9 cm)  BEHAVIORAL SYMPTOMS/MOOD NEUROLOGICAL BOWEL NUTRITION STATUS       Incontinent Diet (Please see discharge summary)  AMBULATORY STATUS COMMUNICATION OF NEEDS Skin   Extensive Assist Verbally Other (Comment) (Wound/Incision LDAs,Wound traumatic,knee,anterior,R)                       Personal Care Assistance Level of Assistance  Feeding, Bathing, Dressing Bathing Assistance: Maximum assistance Feeding assistance: Independent Dressing Assistance: Maximum assistance     Functional Limitations Info  Sight, Hearing, Speech Sight Info: Impaired Financial trader) Hearing Info: Impaired Speech Info: Adequate    SPECIAL CARE FACTORS FREQUENCY  PT (By licensed PT), OT (By licensed OT)     PT Frequency: 5x min weekly OT Frequency: 5x min weekly            Contractures Contractures Info: Not present    Additional Factors Info  Code Status, Allergies, Psychotropic, Insulin  Sliding Scale Code Status Info: FULL Allergies Info: Azithromycin,Fenoprofen,Duloxetine Psychotropic Info: busPIRone  (BUSPAR ) tablet 30 mg 2 times daily,escitalopram  (LEXAPRO ) tablet 20 mg daily Insulin  Sliding Scale Info: insulin  aspart (novoLOG ) injection 0-15 Units 3 times daily with meals,insulin  aspart (novoLOG ) injection 0-5 Units daily at bedtime       Current Medications (09/30/2024):  This is the current hospital active medication list Current Facility-Administered Medications  Medication Dose Route Frequency Provider Last Rate Last Admin   0.9 %  sodium chloride  infusion   Intravenous PRN Cindy Garnette POUR, MD 10 mL/hr at 09/30/24 0306 Infusion Verify at 09/30/24 0306   0.9 %  sodium chloride  infusion   Intravenous PRN Cindy Garnette POUR, MD 10 mL/hr at 09/30/24 0306 Infusion Verify  at 09/30/24 0306   acetaminophen  (TYLENOL ) tablet 650 mg  650 mg Oral Q6H PRN Sim Emery CROME, MD       Or   acetaminophen  (TYLENOL ) suppository 650 mg  650 mg Rectal Q6H PRN Sim Emery CROME, MD       albuterol  (PROVENTIL ) (2.5 MG/3ML) 0.083% nebulizer solution 2.5 mg  2.5 mg Inhalation Q6H  PRN Garba, Mohammad L, MD       amoxicillin-clavulanate (AUGMENTIN) 875-125 MG per tablet 1 tablet  1 tablet Oral Q12H Cindy Garnette POUR, MD       apixaban WINN) tablet 5 mg  5 mg Oral BID Uhlorn, Garett M, RPH       budesonide -glycopyrrolate-formoterol (BREZTRI) 160-9-4.8 MCG/ACT inhaler 2 puff  2 puff Inhalation BID Garba, Mohammad L, MD   2 puff at 09/30/24 0914   busPIRone  (BUSPAR ) tablet 30 mg  30 mg Oral BID Garba, Mohammad L, MD   30 mg at 09/30/24 9093   doxepin  (SINEQUAN ) capsule 150 mg  150 mg Oral QHS Garba, Mohammad L, MD   150 mg at 09/29/24 2153   doxycycline (VIBRA-TABS) tablet 100 mg  100 mg Oral Q12H Cindy Garnette POUR, MD       escitalopram  (LEXAPRO ) tablet 20 mg  20 mg Oral Daily Garba, Mohammad L, MD   20 mg at 09/30/24 9092   heparin  ADULT infusion 100 units/mL (25000 units/250mL)  1,150 Units/hr Intravenous Continuous Marten Larraine HERO, RPH 11.5 mL/hr at 09/30/24 1648 1,150 Units/hr at 09/30/24 1648   hydrocerin (EUCERIN) cream   Topical BID Garba, Mohammad L, MD   1 Application at 09/30/24 0908   insulin  aspart (novoLOG ) injection 0-15 Units  0-15 Units Subcutaneous TID WC Sim Emery L, MD   2 Units at 09/30/24 1316   insulin  aspart (novoLOG ) injection 0-5 Units  0-5 Units Subcutaneous QHS Sim Emery L, MD       labetalol (NORMODYNE) injection 5 mg  5 mg Intravenous Q2H PRN Cindy Garnette POUR, MD       ondansetron  (ZOFRAN ) tablet 4 mg  4 mg Oral Q6H PRN Sim Emery CROME, MD       Or   ondansetron  (ZOFRAN ) injection 4 mg  4 mg Intravenous Q6H PRN Garba, Mohammad L, MD       polyethylene glycol (MIRALAX / GLYCOLAX) packet 17 g  17 g Oral BID Cindy Garnette POUR, MD   17 g at 09/30/24 1121     Discharge Medications: Please see discharge summary for a list of discharge medications.  Relevant Imaging Results:  Relevant Lab Results:   Additional Information [id number]  Isaiah Public, LCSWA

## 2024-09-30 NOTE — Evaluation (Signed)
 Occupational Therapy Evaluation Patient Details Name: Chris Mercer MRN: [id number] DOB: [birth year] Today's Date: 09/30/2024   History of Present Illness   Pt is 78 yo presenting to [facility] on 10/1 due to son calling EMS secondary to fatigue, malaise, nauseas without vomiting, chills. PMH: HTN, DM II, CKD, HLD.     Clinical Impressions Pt presents with decline in function and safety with ADLs and ADL mobility with impaired strength, balance and endurance. PTA pt lived alone and was Ind with ADLs/selfcare, did not use an AD for mobility. Pt currently requires CGA with UB ADLs, min A with grooming/hygiene tasks, max A with LB ADLs, max A with toileting tasks and mod/min A with mobility/transfers using RW. OT will follow acutely to maximize level of function and safety     If plan is discharge home, recommend the following:   A lot of help with bathing/dressing/bathroom;A lot of help with walking and/or transfers;Assistance with cooking/housework;Assist for transportation;Help with stairs or ramp for entrance     Functional Status Assessment   Patient has had a recent decline in their functional status and demonstrates the ability to make significant improvements in function in a reasonable and predictable amount of time.     Equipment Recommendations   Other (comment) (defer)     Recommendations for Other Services         Precautions/Restrictions   Precautions Precautions: Fall Recall of Precautions/Restrictions: Intact Restrictions Weight Bearing Restrictions Per Provider Order: No     Mobility Bed Mobility               General bed mobility comments: pt in chair upon arrival    Transfers Overall transfer level: Needs assistance Equipment used: Rolling walker (2 wheels) Transfers: Sit to/from Stand, Bed to chair/wheelchair/BSC Sit to Stand: Mod assist     Step pivot transfers: Min assist            Balance Overall balance assessment: Needs  assistance Sitting-balance support: Bilateral upper extremity supported, Feet supported Sitting balance-Leahy Scale: Fair     Standing balance support: Bilateral upper extremity supported, During functional activity, Reliant on assistive device for balance Standing balance-Leahy Scale: Poor                             ADL either performed or assessed with clinical judgement   ADL Overall ADL's : Needs assistance/impaired Eating/Feeding: Independent;Sitting   Grooming: Wash/dry hands;Wash/dry face;Minimal assistance;Standing   Upper Body Bathing: Contact guard assist;Sitting   Lower Body Bathing: Maximal assistance   Upper Body Dressing : Contact guard assist;Sitting   Lower Body Dressing: Maximal assistance   Toilet Transfer: Moderate assistance;Minimal assistance;Rolling walker (2 wheels);Stand-pivot;Cueing for safety;Cueing for sequencing   Toileting- Clothing Manipulation and Hygiene: Maximal assistance;Sit to/from stand       Functional mobility during ADLs: Moderate assistance;Minimal assistance;Cueing for safety;Rolling walker (2 wheels)       Vision Baseline Vision/History: 1 Wears glasses Ability to See in Adequate Light: 0 Adequate Patient Visual Report: No change from baseline       Perception         Praxis         Pertinent Vitals/Pain Pain Assessment Pain Assessment: No/denies pain     Extremity/Trunk Assessment Upper Extremity Assessment Upper Extremity Assessment: Generalized weakness   Lower Extremity Assessment Lower Extremity Assessment: Defer to PT evaluation   Cervical / Trunk Assessment Cervical / Trunk Assessment: Kyphotic   Communication Communication Communication: No  apparent difficulties   Cognition Arousal: Alert Behavior During Therapy: WFL for tasks assessed/performed Cognition: No family/caregiver present to determine baseline             OT - Cognition Comments: Pt unable to state where he or his son  lives, pt states I don't know why I am answering weird and can't remember some things                 Following commands: Intact       Cueing  General Comments   Cueing Techniques: Verbal cues;Tactile cues  O2 sats with difficulty getting reading in standing 92% in sitting after transfer.   Exercises     Shoulder Instructions      Home Living Family/patient expects to be discharged to:: Private residence Living Arrangements: Alone Available Help at Discharge: Family;Available PRN/intermittently Type of Home: Apartment Home Access: Level entry     Home Layout: One level     Bathroom Shower/Tub: Chief Strategy Officer: Standard     Home Equipment: Grab bars - tub/shower;Rolling Walker (2 wheels)          Prior Functioning/Environment Prior Level of Function : Independent/Modified Independent             Mobility Comments: Pt normally ambulates without an AD. ADLs Comments: Pt Ind with ADLs, light meals    OT Problem List: Decreased strength;Decreased knowledge of use of DME or AE;Decreased activity tolerance;Impaired balance (sitting and/or standing)   OT Treatment/Interventions: Self-care/ADL training;Patient/family education;Therapeutic exercise;Balance training;Therapeutic activities;DME and/or AE instruction      OT Goals(Current goals can be found in the care plan section)   Acute Rehab OT Goals Patient Stated Goal: go home OT Goal Formulation: With patient Time For Goal Achievement: 10/14/24 Potential to Achieve Goals: Good ADL Goals Pt Will Perform Grooming: with min assist;with contact guard assist;standing Pt Will Perform Upper Body Bathing: with supervision;sitting Pt Will Perform Lower Body Bathing: with max assist Pt Will Perform Upper Body Dressing: with supervision;sitting Pt Will Transfer to Toilet: with min assist;with contact guard assist;ambulating;stand pivot transfer Pt Will Perform Toileting - Clothing  Manipulation and hygiene: with mod assist;with min assist;sitting/lateral leans;sit to/from stand   OT Frequency:  Min 2X/week    Co-evaluation              AM-PAC OT 6 Clicks Daily Activity     Outcome Measure Help from another person eating meals?: None Help from another person taking care of personal grooming?: A Little Help from another person toileting, which includes using toliet, bedpan, or urinal?: A Lot Help from another person bathing (including washing, rinsing, drying)?: A Lot Help from another person to put on and taking off regular upper body clothing?: A Little Help from another person to put on and taking off regular lower body clothing?: A Lot 6 Click Score: 16   End of Session Equipment Utilized During Treatment: Gait belt;Rolling walker (2 wheels) Nurse Communication: Mobility status  Activity Tolerance: Patient tolerated treatment well Patient left: in chair;with call bell/phone within reach;with chair alarm set  OT Visit Diagnosis: Unsteadiness on feet (R26.81);Other abnormalities of gait and mobility (R26.89);Muscle weakness (generalized) (M62.81)                Time: 8681-8652 OT Time Calculation (min): 29 min Charges:  OT General Charges $OT Visit: 1 Visit OT Evaluation $OT Eval Moderate Complexity: 1 Mod OT Treatments $Therapeutic Activity: 8-22 mins    Jacques Karna Loose 09/30/2024, 2:22  PM

## 2024-09-30 NOTE — Telephone Encounter (Signed)
 Patient Product/process development scientist completed.    The patient is insured through [employer]. Patient has Medicare and is not eligible for a copay card, but may be able to apply for patient assistance or Medicare RX Payment Plan (Patient Must reach out to their plan, if eligible for payment plan), if available.    Ran test claim for Eliquis 5 mg and the current 30 day co-pay is $47.00.  Ran test claim for Xarelto 20 mg and the current 30 day co-pay is $47.00.  This test claim was processed through [facility]- copay amounts may vary at other pharmacies due to pharmacy/plan contracts, or as the patient moves through the different stages of their insurance plan.     Roland Earl, CPHT Pharmacy Technician III Certified Patient Advocate [facility] Patient Advocate Team Direct Number: [phone]  Fax: [phone]

## 2024-09-30 NOTE — Progress Notes (Signed)
 PHARMACY - ANTICOAGULATION CONSULT NOTE  Pharmacy Consult:  Apixaban Indication: atrial fibrillation  Allergies  Allergen Reactions   Azithromycin Anaphylaxis, Swelling and Hives   Fenoprofen Anaphylaxis   Duloxetine Diarrhea    With dark stools       Patient Measurements: Height: 6' (182.9 cm) Weight: 82.7 kg (182 lb 5.1 oz) IBW/kg (Calculated) : 77.6  Vital Signs: Temp: 98 F (36.7 C) (10/03 1619) Temp Source: Oral (10/03 1619) BP: 123/81 (10/03 1619) Pulse Rate: 76 (10/03 1619)  Labs: Recent Labs    09/28/24 1421 09/28/24 1753 09/28/24 2214 09/29/24 0616 09/29/24 0827 09/30/24 0208  HGB 12.8*  --  14.1  --  13.9 12.1*  HCT 38.1*  --  40.0  --  40.0 34.8*  PLT 252  --  249  --  204 168  LABPROT 17.5*  --   --  15.2  --   --   INR 1.4*  --   --  1.1  --   --   HEPARINUNFRC  --   --   --   --   --  0.55  CREATININE 0.85  --  0.97  --  0.92 1.01  TROPONINIHS 116* 142*  --   --   --   --     Estimated Creatinine Clearance: 68.3 mL/min (by C-G formula based on SCr of 1.01 mg/dL).   Medical History: Past Medical History:  Diagnosis Date   Anxiety    Aortic atherosclerosis    CAD (coronary artery disease)    a.) MPI 04/16/2021 --> LVEF 71%. Large severe perferion defect in the apex, apicolateral, mid inferolateral, and basal inferolateral location; no RWMAs; ischemia in LCx territory. b.) CCTA 04/25/2021 --> CCS/Agatston score 684.7.   Chronic pain syndrome    CKD (chronic kidney disease), stage III (HCC)    COPD (chronic obstructive pulmonary disease) (HCC)    Depression    Diabetic neuropathy (HCC)    GERD (gastroesophageal reflux disease)    Hepatic steatosis    Hiatal hernia    History of heart artery stent 2002   a.) type and location unknown   HLD (hyperlipidemia)    Hypertension    LAFB (left anterior fascicular block)    LVH (left ventricular hypertrophy)    OSA (obstructive sleep apnea)    a.) PSG 12/14/2019 --> AHI 6. b.) cannot tolerate  nocturnal PAP therapy d/t COPD and significant GERD.   Osteoarthritis    Right middle lobe pulmonary nodule 03/26/2019   a.) CT 03/25/2021 - measured 2.5 mm.   T2DM (type 2 diabetes mellitus) (HCC)    Umbilical hernia    s/p repair     Assessment: 38 YOM presented with possible sepsis. Consulted to transition from heparin  to apixaban.   Goal of Therapy:  Therapeutic anticoagulation   Plan:  First dose of apixaban 5mg  BID 10/3 1800, turn off heparin  infusion right after.  Larraine Brazier, PharmD Clinical Pharmacist 09/30/2024  4:54 PM **Pharmacist phone directory can now be found on [email or web address] ([email or web address]).  Listed under [facility].

## 2024-09-30 NOTE — Progress Notes (Signed)
 Progress Note   Patient: Chris Mercer FMW:[id number] DOB: [birth year] DOA: 09/28/2024     2 DOS: the patient was seen and examined on 09/30/2024   Brief hospital course: 78 y.o. male with medical history significant of COPD, coronary artery disease, chronic pain syndrome, GERD, hyperlipidemia, essential hypertension, obstructive sleep apnea, depression, who presents to the ER via EMS from home where his son called out saying patient is confused and having nausea vomiting and weakness for the last few days.  There is also new onset atrial fibrillation.  He is usually awake alert oriented but today he is only alert and oriented x 2.  Patient was seen in the emergency room with initial evaluation done showing normal sinus rhythm on his EKG, patient however has elevated troponin up to 116.  Repeat troponin is currently pending.  Initial lactic acid level was more than six 7.6 with repeat dropping to 4.6.  He has mildly elevated BNP has a white count and negative viral panel.  Suspicion for early sepsis of unknown source.  Patient initiated on antibiotics and hydrated   Assessment and Plan: #1 severe sepsis with lactic acidosis present on admit, unclear source:  -initially suspected pneumonia.   -ill defined density in RUL on CXR.  -F/u CT chest reviewed, with subtle ground glass opacity in RUL that may be scarring. Repeat CT recommended every 15yrs until 41yrs of stability. Also more confluent airspace opacity noted in deep post costophrenic sulcus -presented with lactate over 7, encephalopathy -continued on empiric cefepime, flagyl, vanc -Lactate normalized with IVF -blood cx neg thus far -Pt is clinically much improved. Will narrow coverage to doxycycline with augmentin to focus on PNA. Of note, pt has documented allergy to azithro   #2 acute metabolic encephalopathy:  -CT head unremarkable -Suspect secondary to severe sepsis -Mentation now much improved. Conversing appropriately   #3 essential  hypertension:  -was hypotensive, now bp much improved   #4 type 2 diabetes:  -cont SSI as needed   #5 depression with anxiety:  -Cont lexapro  per home regimen   #6 hyperlipidemia:  -Reportedly not taking crestor PTA, last dispensed 04/20/24   #7 lactic acidosis:  -normalized with IVF hydration   #8 normocytic anemia:  -follow cbc trends   #9 elevated troponin:  -suspect demand ischemia related to severe sepsis  New onset afib with RVR -HR improved with trial of lopressor, however noted to be hypotensive later in the day requiring IVF bolus. Beta blocker held for now -2d echo reviewed. Afib noted. Normal LVEF with no WMA. Severe concentric LV hypertrophy -CHADS-VASc 5. Tolerated heparin  gtt. Will transition to eliquis  Fall -B knee xrays neg for acute pathology  Hypokalemia -cont to correct  Hypomagnesemia  -corrected      Subjective: Feeling better today. Ate entire breakfast this AM  Physical Exam: Vitals:   09/30/24 0805 09/30/24 1222 09/30/24 1423 09/30/24 1619  BP: 132/63 129/69 113/82 123/81  Pulse: (!) 48 (!) 54  76  Resp: 16 14  16   Temp: 97.6 F (36.4 C) 97.6 F (36.4 C)  98 F (36.7 C)  TempSrc: Axillary Axillary  Oral  SpO2: 97% 96%    Weight:      Height:       General exam: Conversant, in no acute distress Respiratory system: normal chest rise, clear, no audible wheezing Cardiovascular system: regular rhythm, s1-s2 Gastrointestinal system: Nondistended, nontender, pos BS Central nervous system: No seizures, no tremors Extremities: No cyanosis, no joint deformities Skin: No rashes, no  pallor Psychiatry: Affect normal // no auditory hallucinations   Data Reviewed:  Labs reviewed: Na 142, K 3.3, Cr 1.01, WBC 5.0, Hgb 12.1, Plts 168  Family Communication: Pt in room, family not at bedside  Disposition: Status is: Inpatient Remains inpatient appropriate because: Severity of illness  Planned Discharge Destination: Skilled nursing  facility    Author: Garnette Pelt, MD 09/30/2024 4:35 PM  For on call review [email or web address].

## 2024-10-01 DIAGNOSIS — R4182 Altered mental status, unspecified: Secondary | ICD-10-CM | POA: Diagnosis not present

## 2024-10-01 DIAGNOSIS — R1084 Generalized abdominal pain: Secondary | ICD-10-CM | POA: Diagnosis not present

## 2024-10-01 DIAGNOSIS — S63259A Unspecified dislocation of unspecified finger, initial encounter: Secondary | ICD-10-CM | POA: Diagnosis not present

## 2024-10-01 LAB — GLUCOSE, CAPILLARY
Glucose-Capillary: 105 mg/dL — ABNORMAL HIGH (ref 70–99)
Glucose-Capillary: 121 mg/dL — ABNORMAL HIGH (ref 70–99)
Glucose-Capillary: 114 mg/dL — ABNORMAL HIGH (ref 70–99)
Glucose-Capillary: 139 mg/dL — ABNORMAL HIGH (ref 70–99)

## 2024-10-01 LAB — CBC
HCT: 35.1 % — ABNORMAL LOW (ref 39.0–52.0)
Hemoglobin: 11.9 g/dL — ABNORMAL LOW (ref 13.0–17.0)
MCH: 31.4 pg (ref 26.0–34.0)
MCHC: 33.9 g/dL (ref 30.0–36.0)
MCV: 92.6 fL (ref 80.0–100.0)
Platelets: 158 K/uL (ref 150–400)
RBC: 3.79 MIL/uL — ABNORMAL LOW (ref 4.22–5.81)
RDW: 15.9 % — ABNORMAL HIGH (ref 11.5–15.5)
WBC: 5.7 K/uL (ref 4.0–10.5)
nRBC: 0 % (ref 0.0–0.2)

## 2024-10-01 LAB — BASIC METABOLIC PANEL WITH GFR
Anion gap: 8 (ref 5–15)
BUN: 12 mg/dL (ref 8–23)
CO2: 23 mmol/L (ref 22–32)
Calcium: 9 mg/dL (ref 8.9–10.3)
Chloride: 108 mmol/L (ref 98–111)
Creatinine, Ser: 1.08 mg/dL (ref 0.61–1.24)
GFR, Estimated: >60 mL/min (ref >60)
Glucose, Bld: 128 mg/dL — ABNORMAL HIGH (ref 70–99)
Potassium: 3.5 mmol/L (ref 3.5–5.1)
Sodium: 139 mmol/L (ref 135–145)

## 2024-10-01 MED ORDER — BISACODYL 10 MG RE SUPP
10.0000 mg | RECTAL | Status: AC
  Administered 2024-10-01: 10 mg via RECTAL
  Filled 2024-10-01: qty 1

## 2024-10-01 MED ORDER — POTASSIUM CHLORIDE CRYS ER 20 MEQ PO TBCR
60.0000 meq | EXTENDED_RELEASE_TABLET | Freq: Once | ORAL | Status: AC
  Administered 2024-10-01: 60 meq via ORAL
  Filled 2024-10-01: qty 3

## 2024-10-01 MED ORDER — LISINOPRIL 20 MG PO TABS
40.0000 mg | ORAL_TABLET | Freq: Every day | ORAL | Status: DC
  Administered 2024-10-01 – 2024-10-03 (×3): 40 mg via ORAL
  Filled 2024-10-01 (×3): qty 2

## 2024-10-01 NOTE — Progress Notes (Signed)
 Progress Note   Patient: Chris Mercer FMW:[id number] DOB: 1946-12-26 DOA: 09/28/2024     3 DOS: the patient was seen and examined on 10/01/2024   Brief hospital course: 78 y.o. male with medical history significant of COPD, coronary artery disease, chronic pain syndrome, GERD, hyperlipidemia, essential hypertension, obstructive sleep apnea, depression, who presents to the ER via EMS from home where his son called out saying patient is confused and having nausea vomiting and weakness for the last few days.  There is also new onset atrial fibrillation.  He is usually awake alert oriented but today he is only alert and oriented x 2.  Patient was seen in the emergency room with initial evaluation done showing normal sinus rhythm on his EKG, patient however has elevated troponin up to 116.  Repeat troponin is currently pending.  Initial lactic acid level was more than six 7.6 with repeat dropping to 4.6.  He has mildly elevated BNP has a white count and negative viral panel.  Suspicion for early sepsis of unknown source.  Patient initiated on antibiotics and hydrated   Assessment and Plan: #1 severe sepsis with lactic acidosis present on admit, unclear source:  -initially suspected pneumonia.   -ill defined density in RUL on CXR.  -F/u CT chest reviewed, with subtle ground glass opacity in RUL that may be scarring. Repeat CT recommended every 22yrs until 65yrs of stability. Also more confluent airspace opacity noted in deep post costophrenic sulcus -presented with lactate over 7, encephalopathy -Pt was initially on empiric cefepime, flagyl, vanc, now on doxy with augmentin to complete course -Lactate normalized with IVF -blood cx neg   #2 acute metabolic encephalopathy:  -CT head unremarkable -Likely secondary to severe sepsis -Mentation now much improved. Conversing appropriately   #3 essential hypertension:  -was hypotensive, now bp much improved -will resume lisinopril    #4 type 2 diabetes:   -cont SSI as needed   #5 depression with anxiety:  -Cont lexapro  per home regimen   #6 hyperlipidemia:  -Reportedly not taking crestor PTA, last dispensed 04/20/24   #7 lactic acidosis:  -normalized with IVF hydration   #8 normocytic anemia:  -follow cbc trends   #9 elevated troponin:  -suspect demand ischemia related to severe sepsis  New onset afib with RVR -HR improved with trial of lopressor, however noted to be hypotensive later in the day requiring IVF bolus. Beta blocker held for now -2d echo reviewed. Afib noted. Normal LVEF with no WMA. Severe concentric LV hypertrophy -CHADS-VASc 5. Tolerated heparin  gtt. Now on eliquis -currently rate controlled  Fall -B knee xrays neg for acute pathology  Hypokalemia -cont to correct  Hypomagnesemia  -corrected  Constipation -Still no results despite miralax -Will give trial of dulcolax suppository -If still no results, then may need enema      Subjective: Feeling constipated. No BM as long as pt can recall  Physical Exam: Vitals:   10/01/24 0721 10/01/24 0852 10/01/24 0923 10/01/24 1207  BP: (!) 97/57   (!) 158/90  Pulse: (!) 54 74  78  Resp: 20 17  (!) 25  Temp: (!) 96.2 F (35.7 C)  98.4 F (36.9 C) 98.8 F (37.1 C)  TempSrc: Rectal  Oral Oral  SpO2: 94% 94%    Weight:      Height:       General exam: Awake, laying in bed, in nad Respiratory system: Normal respiratory effort, no wheezing Cardiovascular system: regular rate, s1, s2 Gastrointestinal system: Soft, nondistended, positive BS Central nervous  system: CN2-12 grossly intact, strength intact Extremities: Perfused, no clubbing Skin: Normal skin turgor, no notable skin lesions seen Psychiatry: Mood normal // no visual hallucinations   Data Reviewed:  Labs reviewed: Na 139, K 3.5, Cr 1.08, WBC 5.7, Hgb 11.9, Plts 158  Family Communication: Pt in room, family not at bedside  Disposition: Status is: Inpatient Remains inpatient appropriate  because: Severity of illness  Planned Discharge Destination: Skilled nursing facility    Author: Garnette Pelt, MD 10/01/2024 3:22 PM  For on call review [email or web address].

## 2024-10-01 NOTE — Plan of Care (Signed)
  Problem: Coping: Goal: Ability to adjust to condition or change in health will improve Outcome: Progressing   Problem: Education: Goal: Ability to describe self-care measures that may prevent or decrease complications (Diabetes Survival Skills Education) will improve Outcome: Progressing Goal: Individualized Educational Video(s) Outcome: Progressing   Problem: Fluid Volume: Goal: Ability to maintain a balanced intake and output will improve Outcome: Progressing

## 2024-10-01 NOTE — TOC Progression Note (Signed)
 Transition of Care [facility]) - Initial/Assessment Note    Patient Details  Name: Chris Mercer MRN: [id number] Date of Birth: 11-30-1946  Transition of Care [facility]) CM/SW Contact:    Britt JULIANNA Bennetts, LCSW Phone Number: 10/01/2024, 12:41 PM  Clinical Narrative:                 CSW uploaded clinicals to [facility] and faxed patient out to SNF's for  review.  Pending PASRR number and bed offers.  TOC will continue to follow.  Expected Discharge Plan: Skilled Nursing Facility Barriers to Discharge: Continued Medical Work up   Patient Goals and CMS Choice Patient states their goals for this hospitalization and ongoing recovery are:: to go to SNF   Choice offered to / list presented to : Patient      Expected Discharge Plan and Services In-house Referral: Clinical Social Work     Living arrangements for the past 2 months: Apartment                                      Prior Living Arrangements/Services Living arrangements for the past 2 months: Apartment Lives with:: Self Patient language and need for interpreter reviewed:: Yes Do you feel safe going back to the place where you live?: No   SNF  Need for Family Participation in Patient Care: Yes (Comment) Care giver support system in place?: Yes (comment)   Criminal Activity/Legal Involvement Pertinent to Current Situation/Hospitalization: No - Comment as needed  Activities of Daily Living   ADL Screening (condition at time of admission) Independently performs ADLs?: No Does the patient have a NEW difficulty with bathing/dressing/toileting/self-feeding that is expected to last >3 days?: Yes (Initiates electronic notice to provider for possible OT consult) Does the patient have a NEW difficulty with getting in/out of bed, walking, or climbing stairs that is expected to last >3 days?: Yes (Initiates electronic notice to provider for possible PT consult) Does the patient have a NEW difficulty with communication that is  expected to last >3 days?: Yes (Initiates electronic notice to provider for possible SLP consult) Is the patient deaf or have difficulty hearing?: No Does the patient have difficulty seeing, even when wearing glasses/contacts?: No Does the patient have difficulty concentrating, remembering, or making decisions?: No  Permission Sought/Granted Permission sought to share information with : Case Manager, Magazine features editor, Family Supports Permission granted to share information with : Yes, Verbal Permission Granted  Share Information with NAME: Jenetta  Permission granted to share info w AGENCY: SNF  Permission granted to share info w Relationship: son  Permission granted to share info w Contact Information: Jenetta [phone]  Emotional Assessment Appearance:: Appears stated age Attitude/Demeanor/Rapport: Gracious Affect (typically observed): Calm Orientation: : Oriented to Self, Oriented to  Time, Oriented to Situation Alcohol / Substance Use: Not Applicable Psych Involvement: No (comment)  Admission diagnosis:  Generalized abdominal pain [R10.84] Dislocation of finger, initial encounter [S63.259A] Sepsis (HCC) [A41.9] Altered mental status, unspecified altered mental status type [R41.82] Nausea and vomiting, unspecified vomiting type [R11.2] Patient Active Problem List   Diagnosis Date Noted   Sepsis (HCC) 09/28/2024   S/P repair of paraesophageal hernia 09/26/2021   Difficulty walking 06/05/2021   Tremor 06/05/2021   Edema of lower extremity 02/11/2021   Stage 3a chronic kidney disease (HCC) 02/11/2021   Acute renal failure syndrome 07/11/2020   Chronic kidney disease due to hypertension 07/11/2020   Hyperlipidemia  03/26/2019   Pressure injury of skin 03/23/2019   Medication-induced delirium, acute, mixed level of activity 03/21/2019   Depression 03/21/2019   Anxiety 03/21/2019   Flu-like symptoms    Generalized weakness    Altered mental status 03/18/2019    Neuropathy 03/18/2019   Essential hypertension 03/18/2019   Type 2 diabetes mellitus (HCC) 03/18/2019   Chronic pain syndrome 03/18/2019   Encounter for general adult medical examination without abnormal findings 04/29/2018   Sensory ataxia 03/09/2018   Hyperreflexia 02/02/2018   Other amnesia 11/16/2017   PCP:  Glennon Sand, NP (Inactive) Pharmacy:   [id number] - [place], [place] - [address] [place] [address] Phone: [phone] Fax: [phone]  [facility] [id number] - [place], [place] - [address] [place] [address] Phone: [phone] Fax: [phone]     Social Drivers of Health (SDOH) Social History: SDOH Screenings   Food Insecurity: No Food Insecurity (09/28/2024)  Housing: Low Risk  (09/28/2024)  Transportation Needs: No Transportation Needs (09/28/2024)  Utilities: Not At Risk (09/28/2024)  Social Connections: Unknown (09/29/2024)  Tobacco Use: Medium Risk (09/28/2024)   SDOH Interventions:     Readmission Risk Interventions     No data to display

## 2024-10-01 NOTE — Progress Notes (Signed)
   10/01/24 1445  Mobility  Activity Ambulated with assistance (STSx2)  Level of Assistance Minimal assist, patient does 75% or more  Assistive Device Front wheel walker  Distance Ambulated (ft) 2 ft  Activity Response Tolerated fair  Mobility Referral Yes  Mobility visit 1 Mobility  Mobility Specialist Start Time (ACUTE ONLY) 1445  Mobility Specialist Stop Time (ACUTE ONLY) 1458  Mobility Specialist Time Calculation (min) (ACUTE ONLY) 13 min   Mobility Specialist: Progress Note  Pre-Mobility:      HR 83, SpO2 95% RA Post-Mobility:    HR 87   Pt agreeable to mobility session - received in chair. Pt was asymptomatic throughout session with no complaints. Returned to chair with all needs met - call bell within reach. Chair alarm on.   Additional comments: Pt agreeable for transfer to bed, then after initial STS sat back in chair, attempted a second time and sat in chair. Then when asking pt if he would like to remain sitting in chair he stated yes.  Virgle Boards, BS Mobility Specialist Please contact via SecureChat or  Rehab office at [phone].

## 2024-10-02 DIAGNOSIS — S63259A Unspecified dislocation of unspecified finger, initial encounter: Secondary | ICD-10-CM | POA: Diagnosis not present

## 2024-10-02 DIAGNOSIS — R4182 Altered mental status, unspecified: Secondary | ICD-10-CM | POA: Diagnosis not present

## 2024-10-02 DIAGNOSIS — R1084 Generalized abdominal pain: Secondary | ICD-10-CM | POA: Diagnosis not present

## 2024-10-02 LAB — CBC
HCT: 37.7 % — ABNORMAL LOW (ref 39.0–52.0)
Hemoglobin: 12.8 g/dL — ABNORMAL LOW (ref 13.0–17.0)
MCH: 31.4 pg (ref 26.0–34.0)
MCHC: 34 g/dL (ref 30.0–36.0)
MCV: 92.6 fL (ref 80.0–100.0)
Platelets: 174 K/uL (ref 150–400)
RBC: 4.07 MIL/uL — ABNORMAL LOW (ref 4.22–5.81)
RDW: 15.9 % — ABNORMAL HIGH (ref 11.5–15.5)
WBC: 6.1 K/uL (ref 4.0–10.5)
nRBC: 0 % (ref 0.0–0.2)

## 2024-10-02 LAB — GLUCOSE, CAPILLARY
Glucose-Capillary: 103 mg/dL — ABNORMAL HIGH (ref 70–99)
Glucose-Capillary: 135 mg/dL — ABNORMAL HIGH (ref 70–99)
Glucose-Capillary: 131 mg/dL — ABNORMAL HIGH (ref 70–99)
Glucose-Capillary: 133 mg/dL — ABNORMAL HIGH (ref 70–99)

## 2024-10-02 MED ORDER — CHLORHEXIDINE GLUCONATE CLOTH 2 % EX PADS: 6.0000 | MEDICATED_PAD | Freq: Every day | CUTANEOUS | Status: DC

## 2024-10-02 MED ORDER — SODIUM CHLORIDE 0.9% FLUSH: 10.0000 mL | INTRAVENOUS | Status: DC | PRN

## 2024-10-02 MED ORDER — SODIUM CHLORIDE 0.9% FLUSH: 10.0000 mL | Freq: Two times a day (BID) | INTRAVENOUS | Status: DC

## 2024-10-02 NOTE — Progress Notes (Signed)
 Progress Note   Patient: Chris Mercer FMW:[id number] DOB: Jul 03, 1946 DOA: 09/28/2024     4 DOS: the patient was seen and examined on 10/02/2024   Brief hospital course: 78 y.o. male with medical history significant of COPD, coronary artery disease, chronic pain syndrome, GERD, hyperlipidemia, essential hypertension, obstructive sleep apnea, depression, who presents to the ER via EMS from home where his son called out saying patient is confused and having nausea vomiting and weakness for the last few days.  There is also new onset atrial fibrillation.  He is usually awake alert oriented but today he is only alert and oriented x 2.  Patient was seen in the emergency room with initial evaluation done showing normal sinus rhythm on his EKG, patient however has elevated troponin up to 116.  Repeat troponin is currently pending.  Initial lactic acid level was more than six 7.6 with repeat dropping to 4.6.  He has mildly elevated BNP has a white count and negative viral panel.  Suspicion for early sepsis of unknown source.  Patient initiated on antibiotics and hydrated   Assessment and Plan: #1 severe sepsis with lactic acidosis present on admit, unclear source:  -initially suspected pneumonia.   -ill defined density in RUL on CXR.  -F/u CT chest reviewed, with subtle ground glass opacity in RUL that may be scarring. Repeat CT recommended every 4yrs until 30yrs of stability. Also more confluent airspace opacity noted in deep post costophrenic sulcus -presented with lactate over 7, encephalopathy -Pt was initially on empiric cefepime, flagyl, vanc, now on doxy with augmentin to complete course -Lactate normalized with IVF -blood cx neg -clinically improved   #2 acute metabolic encephalopathy:  -CT head unremarkable -Likely secondary to severe sepsis -There are also concerns of baseline cognitive deficits PTA   #3 essential hypertension:  -was hypotensive, now bp much improved -will resume  lisinopril    #4 type 2 diabetes:  -cont SSI as needed   #5 depression with anxiety:  -Cont lexapro  per home regimen   #6 hyperlipidemia:  -Reportedly not taking crestor PTA, last dispensed 04/20/24   #7 lactic acidosis:  -normalized with IVF hydration   #8 normocytic anemia:  -follow cbc trends   #9 elevated troponin:  -suspect demand ischemia related to severe sepsis  New onset afib with RVR -HR improved with trial of lopressor, however noted to be hypotensive later in the day requiring IVF bolus. Beta blocker held for now -2d echo reviewed. Afib noted. Normal LVEF with no WMA. Severe concentric LV hypertrophy -CHADS-VASc 5. Tolerated heparin  gtt. Now on eliquis -currently remains rate controlled  Fall -B knee xrays neg for acute pathology  Hypokalemia -cont to correct  Hypomagnesemia  -corrected  Constipation -multiple bm reported by staff following cathartics      Subjective:pleasantly confused this AM  Physical Exam: Vitals:   10/02/24 0421 10/02/24 0731 10/02/24 0835 10/02/24 1222  BP: 127/83 (!) 140/84  (!) 159/96  Pulse: (!) 53 64    Resp: 19   18  Temp: 98.1 F (36.7 C) 98.4 F (36.9 C)  98 F (36.7 C)  TempSrc: Oral Oral  Oral  SpO2: 95% 96% 94%   Weight: 86.5 kg     Height:       General exam: Conversant, in no acute distress Respiratory system: normal chest rise, clear, no audible wheezing Cardiovascular system: regular rhythm, s1-s2 Gastrointestinal system: Nondistended, nontender, pos BS Central nervous system: No seizures, no tremors Extremities: No cyanosis, no joint deformities Skin: No rashes,  no pallor Psychiatry: Affect normal // no auditory hallucinations   Data Reviewed:  There are no new results to review at this time.  Family Communication: Pt in room, family not at bedside  Disposition: Status is: Inpatient Remains inpatient appropriate because: Severity of illness  Planned Discharge Destination: Skilled nursing  facility    Author: Garnette Pelt, MD 10/02/2024 3:45 PM  For on call review [email or web address].

## 2024-10-02 NOTE — TOC Progression Note (Addendum)
 Transition of Care [facility]) - Progression Note    Patient Details  Name: Chris Mercer MRN: [id number] Date of Birth: 11/21/1946  [facility] CM/SW Contact  Isaiah Public, LCSWA Phone Number: 10/02/2024, 10:06 AM  Clinical Narrative:     Due to patients current orientation CSW spoke with patients son Aron and provided SNF bed offers. Patients son accepted SNF bed offer with [facility] for patient. CSW following to start insurance authorization closer to patient being medically ready. Darrian with [facility] confirmed SNF bed for patient. All questions answered. No further questions reported at this time.  Update- CSW tried to submit auth through portal, unable. CSW spoke with patients insurance and requested to start auth. Harlene with insurance request CSW to fax clinicals to [phone]. CSW faxed over clinicals requested to patients insurance. Auth ID# [id number]. Insurance authorization currently pending.   Expected Discharge Plan: Skilled Nursing Facility Barriers to Discharge: Continued Medical Work up               Expected Discharge Plan and Services In-house Referral: Clinical Social Work     Living arrangements for the past 2 months: Apartment                                       Social Drivers of Health (SDOH) Interventions SDOH Screenings   Food Insecurity: No Food Insecurity (09/28/2024)  Housing: Low Risk  (09/28/2024)  Transportation Needs: No Transportation Needs (09/28/2024)  Utilities: Not At Risk (09/28/2024)  Social Connections: Unknown (09/29/2024)  Tobacco Use: Medium Risk (09/28/2024)    Readmission Risk Interventions     No data to display

## 2024-10-02 NOTE — Plan of Care (Signed)
  Problem: Metabolic: Goal: Ability to maintain appropriate glucose levels will improve Outcome: Progressing   Problem: Clinical Measurements: Goal: Signs and symptoms of infection will decrease Outcome: Progressing   Problem: Respiratory: Goal: Ability to maintain adequate ventilation will improve Outcome: Progressing   Problem: Clinical Measurements: Goal: Respiratory complications will improve Outcome: Progressing Goal: Cardiovascular complication will be avoided Outcome: Progressing   Problem: Activity: Goal: Risk for activity intolerance will decrease Outcome: Progressing   Problem: Safety: Goal: Ability to remain free from injury will improve Outcome: Progressing

## 2024-10-03 ENCOUNTER — Inpatient Hospital Stay (HOSPITAL_COMMUNITY)

## 2024-10-03 DIAGNOSIS — R4182 Altered mental status, unspecified: Secondary | ICD-10-CM | POA: Diagnosis not present

## 2024-10-03 DIAGNOSIS — S63259A Unspecified dislocation of unspecified finger, initial encounter: Secondary | ICD-10-CM | POA: Diagnosis not present

## 2024-10-03 DIAGNOSIS — R1084 Generalized abdominal pain: Secondary | ICD-10-CM | POA: Diagnosis not present

## 2024-10-03 LAB — GLUCOSE, CAPILLARY
Glucose-Capillary: 163 mg/dL — ABNORMAL HIGH (ref 70–99)
Glucose-Capillary: 150 mg/dL — ABNORMAL HIGH (ref 70–99)
Glucose-Capillary: 127 mg/dL — ABNORMAL HIGH (ref 70–99)

## 2024-10-03 LAB — COMPREHENSIVE METABOLIC PANEL WITH GFR
ALT: 21 U/L (ref 0–44)
AST: 26 U/L (ref 15–41)
Albumin: 2.6 g/dL — ABNORMAL LOW (ref 3.5–5.0)
Alkaline Phosphatase: 69 U/L (ref 38–126)
Anion gap: 8 (ref 5–15)
BUN: 16 mg/dL (ref 8–23)
CO2: 24 mmol/L (ref 22–32)
Calcium: 9.6 mg/dL (ref 8.9–10.3)
Chloride: 105 mmol/L (ref 98–111)
Creatinine, Ser: 0.82 mg/dL (ref 0.61–1.24)
GFR, Estimated: >60 mL/min (ref >60)
Glucose, Bld: 129 mg/dL — ABNORMAL HIGH (ref 70–99)
Potassium: 3.8 mmol/L (ref 3.5–5.1)
Sodium: 137 mmol/L (ref 135–145)
Total Bilirubin: 0.6 mg/dL (ref 0.0–1.2)
Total Protein: 5 g/dL — ABNORMAL LOW (ref 6.5–8.1)

## 2024-10-03 LAB — CBC
HCT: 39.6 % (ref 39.0–52.0)
Hemoglobin: 13.3 g/dL (ref 13.0–17.0)
MCH: 31.4 pg (ref 26.0–34.0)
MCHC: 33.6 g/dL (ref 30.0–36.0)
MCV: 93.6 fL (ref 80.0–100.0)
Platelets: 168 K/uL (ref 150–400)
RBC: 4.23 MIL/uL (ref 4.22–5.81)
RDW: 15.7 % — ABNORMAL HIGH (ref 11.5–15.5)
WBC: 5.7 K/uL (ref 4.0–10.5)
nRBC: 0 % (ref 0.0–0.2)

## 2024-10-03 LAB — CULTURE, BLOOD (ROUTINE X 2)
Culture: NO GROWTH
Culture: NO GROWTH

## 2024-10-03 LAB — MAGNESIUM: Magnesium: 1.4 mg/dL — ABNORMAL LOW (ref 1.7–2.4)

## 2024-10-03 MED ORDER — GABAPENTIN 600 MG PO TABS: 600.0000 mg | ORAL_TABLET | Freq: Three times a day (TID) | ORAL | 0 refills | Status: DC

## 2024-10-03 MED ORDER — DOXYCYCLINE HYCLATE 100 MG PO TABS: 100.0000 mg | ORAL_TABLET | Freq: Two times a day (BID) | ORAL | Status: AC

## 2024-10-03 MED ORDER — METOPROLOL TARTRATE 25 MG PO TABS: 12.5000 mg | ORAL_TABLET | Freq: Two times a day (BID) | ORAL | Status: DC

## 2024-10-03 MED ORDER — FENTANYL CITRATE PF 50 MCG/ML IJ SOSY
6.2500 ug | PREFILLED_SYRINGE | Freq: Once | INTRAMUSCULAR | Status: AC
  Administered 2024-10-03: 6.5 ug via INTRAVENOUS
  Filled 2024-10-03: qty 1

## 2024-10-03 MED ORDER — MAGNESIUM SULFATE 4 GM/100ML IV SOLN
4.0000 g | INTRAVENOUS | Status: AC
  Administered 2024-10-03: 4 g via INTRAVENOUS
  Filled 2024-10-03: qty 100

## 2024-10-03 MED ORDER — GABAPENTIN 300 MG PO CAPS: 300.0000 mg | ORAL_CAPSULE | Freq: Three times a day (TID) | ORAL | 0 refills | Status: AC

## 2024-10-03 MED ORDER — AMOXICILLIN-POT CLAVULANATE 875-125 MG PO TABS: 1.0000 | ORAL_TABLET | Freq: Two times a day (BID) | ORAL | Status: AC

## 2024-10-03 MED ORDER — APIXABAN 5 MG PO TABS: 5.0000 mg | ORAL_TABLET | Freq: Two times a day (BID) | ORAL | Status: AC

## 2024-10-03 NOTE — Progress Notes (Signed)
 Mobility Specialist Progress Note;   10/03/24 1014  Mobility  Activity Pivoted/transferred from bed to chair  Level of Assistance Contact guard assist, steadying assist  Assistive Device Front wheel walker  Distance Ambulated (ft) 7 ft  Activity Response Tolerated well  Mobility Referral Yes  Mobility visit 1 Mobility  Mobility Specialist Start Time (ACUTE ONLY) 1014  Mobility Specialist Stop Time (ACUTE ONLY) 1034  Mobility Specialist Time Calculation (min) (ACUTE ONLY) 20 min   Pt agreeable to mobility. Required no physical assistance for bed mobility. MinG to safely transfer to chair. Pt able to take a few steps in room w/o fault, however c/o fatigue. VSS throughout. Pt left in chair with all needs met, alarm on.   Lauraine Erm Mobility Specialist Please contact via SecureChat or [employer] [phone]

## 2024-10-03 NOTE — Discharge Summary (Addendum)
 Physician Discharge Summary   Patient: Chris Mercer MRN: [id number] DOB: [birth year]  Admit date:     09/28/2024  Discharge date: 10/03/24  Discharge Physician: Garnette Pelt   PCP: Glennon Sand, NP (Inactive)   Recommendations at discharge:    Follow up with PCP in 1-2 Recommend Neurology referral for neuropsych eval Please ensure regular bowel movements  Discharge Diagnoses: Principal Problem:   Sepsis (HCC) Active Problems:   Altered mental status   Neuropathy   Essential hypertension   Type 2 diabetes mellitus (HCC)   Generalized weakness   Depression   Anxiety   Hyperlipidemia  Resolved Problems:   * No resolved hospital problems. *  Hospital Course: 78 y.o. male with medical history significant of COPD, coronary artery disease, chronic pain syndrome, GERD, hyperlipidemia, essential hypertension, obstructive sleep apnea, depression, who presents to the ER via EMS from home where his son called out saying patient is confused and having nausea vomiting and weakness for the last few days.  There is also new onset atrial fibrillation.  He is usually awake alert oriented but today he is only alert and oriented x 2.  Patient was seen in the emergency room with initial evaluation done showing normal sinus rhythm on his EKG, patient however has elevated troponin up to 116.  Repeat troponin is currently pending.  Initial lactic acid level was more than six 7.6 with repeat dropping to 4.6.  He has mildly elevated BNP has a white count and negative viral panel.  Suspicion for early sepsis of unknown source.  Patient initiated on antibiotics and hydrated   Assessment and Plan: #1 severe sepsis with lactic acidosis present on admit, unclear source:  -initially suspected pneumonia.   -ill defined density in RUL on CXR.  -F/u CT chest reviewed, with subtle ground glass opacity in RUL that may be scarring. Repeat CT recommended every 77yrs until 29yrs of stability. Also more confluent  airspace opacity noted in deep post costophrenic sulcus -presented with lactate over 7, encephalopathy -Pt was initially on empiric cefepime, flagyl, vanc, now on doxy with augmentin to complete course -Lactate normalized with IVF -blood cx neg -clinically improved   #2 acute metabolic encephalopathy:  -CT head unremarkable -Likely secondary to severe sepsis -There are also concerns of baseline cognitive deficits PTA -Recommend referral to outpt Neurology for neuropsych eval   #3 essential hypertension:  -was hypotensive, now bp much improved -resumed lisinopril  -added low dose beta blocker    #4 type 2 diabetes:  -cont SSI as needed   #5 depression with anxiety:  -Cont lexapro  per home regimen   #6 hyperlipidemia:  -Reportedly not taking crestor PTA, last dispensed 04/20/24   #7 lactic acidosis:  -normalized with IVF hydration   #8 normocytic anemia:  -follow cbc trends   #9 elevated troponin:  -suspect demand ischemia related to severe sepsis   New onset afib with RVR -HR improved with trial of lopressor, however noted to be hypotensive later in the day requiring IVF bolus. Beta blocker held for now -2d echo reviewed. Afib noted. Normal LVEF with no WMA. Severe concentric LV hypertrophy -CHADS-VASc 5. Tolerated heparin  gtt. Now on eliquis   Fall -B knee xrays neg for acute pathology   Hypokalemia -cont to correct   Hypomagnesemia  -corrected   Constipation -multiple bm reported by staff following cathartics    Consultants:  Procedures performed:   Disposition: Skilled nursing facility Diet recommendation:  Regular diet DISCHARGE MEDICATION: Allergies as of 10/03/2024  Reactions   Azithromycin Anaphylaxis, Swelling, Hives   Fenoprofen Anaphylaxis   Duloxetine Diarrhea   With dark stools         Medication List     STOP taking these medications    amLODipine  5 MG tablet Commonly known as: NORVASC    gabapentin  600 MG tablet Commonly  known as: NEURONTIN  Replaced by: gabapentin  300 MG capsule   glipiZIDE 5 MG 24 hr tablet Commonly known as: GLUCOTROL XL   rosuvastatin 40 MG tablet Commonly known as: CRESTOR       TAKE these medications    albuterol  108 (90 Base) MCG/ACT inhaler Commonly known as: VENTOLIN  HFA Inhale 2 puffs into the lungs every 6 (six) hours as needed for wheezing or shortness of breath.   amoxicillin-clavulanate 875-125 MG tablet Commonly known as: AUGMENTIN Take 1 tablet by mouth every 12 (twelve) hours for 2 days.   apixaban 5 MG Tabs tablet Commonly known as: ELIQUIS Take 1 tablet (5 mg total) by mouth 2 (two) times daily.   busPIRone  30 MG tablet Commonly known as: BUSPAR  Take 30 mg by mouth 2 (two) times daily.   doxepin  150 MG capsule Commonly known as: SINEQUAN  TAKE 1 CAPSULE BY MOUTH AT BEDTIME   doxycycline 100 MG tablet Commonly known as: VIBRA-TABS Take 1 tablet (100 mg total) by mouth every 12 (twelve) hours for 2 days.   escitalopram  20 MG tablet Commonly known as: LEXAPRO  Take 20 mg by mouth daily.   gabapentin  300 MG capsule Commonly known as: Neurontin  Take 1 capsule (300 mg total) by mouth 3 (three) times daily. Replaces: gabapentin  600 MG tablet   hydrOXYzine 25 MG tablet Commonly known as: ATARAX Take 25 mg by mouth daily.   lisinopril  40 MG tablet Commonly known as: ZESTRIL  Take 40 mg by mouth daily.   metFORMIN  500 MG tablet Commonly known as: GLUCOPHAGE  Take 1 tablet (500 mg total) by mouth 2 (two) times daily with a meal.   Trelegy Ellipta 100-62.5-25 MCG/ACT Aepb Generic drug: Fluticasone-Umeclidin-Vilant Inhale 1 puff into the lungs daily at 12 noon.        Contact information for follow-up providers     Shari Easter, MD.   Specialty: Orthopedic Surgery Contact information: [address] [place] [address] [place] [address] [phone]              Contact information for after-discharge care     Destination     [facility] .   Service: Skilled Nursing Contact information: [address] [place]  [address] [phone]                    Discharge Exam: Fredricka Weights   09/28/24 2118 10/02/24 0421  Weight: 82.7 kg 86.5 kg   General exam: Awake, laying in bed, in nad Respiratory system: Normal respiratory effort, no wheezing Cardiovascular system: regular rate, s1, s2 Gastrointestinal system: Soft, nondistended, positive BS Central nervous system: CN2-12 grossly intact, strength intact Extremities: Perfused, no clubbing Skin: Normal skin turgor, no notable skin lesions seen Psychiatry: Mood normal // affect normal  Condition at discharge: fair  The results of significant diagnostics from this hospitalization (including imaging, microbiology, ancillary and laboratory) are listed below for reference.   Imaging Studies: CT CHEST W CONTRAST Result Date: 09/30/2024 CLINICAL DATA:  Pneumonia. EXAM: CT CHEST WITH CONTRAST TECHNIQUE: Multidetector CT imaging of the chest was performed during intravenous contrast administration. RADIATION DOSE REDUCTION: This exam was performed according to the  departmental dose-optimization program which includes automated exposure control, adjustment of the mA and/or kV according to patient size and/or use of iterative reconstruction technique. CONTRAST:  75mL OMNIPAQUE  IOHEXOL  350 MG/ML SOLN COMPARISON:  03/26/2019 FINDINGS: Cardiovascular: The heart is enlarged. Coronary artery calcification is evident. Mild atherosclerotic calcification is noted in the wall of the thoracic aorta. Enlargement of the pulmonary outflow tract/main pulmonary arteries suggests pulmonary arterial hypertension. Mediastinum/Nodes: No mediastinal lymphadenopathy. There is no hilar lymphadenopathy. Mild circumferential wall thickening noted distal esophagus. Small hiatal hernia. There is no axillary lymphadenopathy. Lungs/Pleura: Centrilobular  emphsyema noted. Subtle patchy ground-glass opacity is seen in the right upper lobe including a nodular component measuring 9 mm on image 38/4. More confluent small volume subpleural airspace consolidative opacity seen in the deep posterior costophrenic sulcus, likely atelectatic although pneumonia is not excluded. Dependent atelectasis noted lower left lung. No findings to suggest pulmonary edema. No substantial pleural effusion. 4 mm solid nodule identified left lower lobe on 78/4. Upper Abdomen: 2.3 cm ill-defined low-density lesion inferior right liver is new since abdominal CT of 09/21/2018. 2.3 cm low-density lesion posterior interpolar left kidney has increased from 1.5 cm on the 2019 exam. The stomach is distended with gas and food. Probable trace free fluid adjacent to the gastric antrum. Musculoskeletal: No worrisome lytic or sclerotic osseous abnormality. IMPRESSION: 1. Subtle patchy ground-glass opacity in the right upper lobe is similar to prior study from 03/26/2019 suggesting scarring. This includes a nodular component measuring 9 mm. While also likely infectious/inflammatory, repeat CT is recommended every 2 years until 5 years of stability has been established. This recommendation follows the consensus statement: Guidelines for Management of Incidental Pulmonary Nodules Detected on CT Images: From the Fleischner Society 2017; Radiology 2017; 284:228-243. 2. More confluent small volume subpleural airspace consolidative opacity in the deep posterior costophrenic sulcus, likely atelectatic although pneumonia is not excluded. 3. 4 mm solid nodule left lower lobe, new since prior CT of 03/26/2019. No follow-up needed if patient is low-risk.This recommendation follows the consensus statement: Guidelines for Management of Incidental Pulmonary Nodules Detected on CT Images: From the Fleischner Society 2017; Radiology 2017; 284:228-243. 4. 2.3 cm ill-defined low-density lesion inferior right liver is new  since abdominal CT of 09/21/2018. Repeat CT of the abdomen/pelvis with intravenous contrast recommended to further evaluate. 5. 2.3 cm low-density lesion posterior interpolar left kidney has increased from 1.5 cm on the 2019 exam. Likely a cyst, attention on follow-up recommended. 6. Mild circumferential wall thickening distal esophagus. Esophagitis would be a consideration. 7.  Aortic Atherosclerosis (ICD10-I70.0). Electronically Signed   By: Camellia Candle M.D.   On: 09/30/2024 05:33   DG Knee 1-2 Views Right Result Date: 09/29/2024 CLINICAL DATA:  Fall EXAM: DG KNEE 1-2V*R* COMPARISON:  03/18/2019 FINDINGS: Remote fracture deformity proximal shaft of the fibula. No definite acute displaced fracture or malalignment. Tricompartment arthritis of the knee with moderate severe lateral tibiofemoral joint space disease. Trace knee effusion IMPRESSION: 1. No acute osseous abnormality.  Old proximal fibular fracture 2. Tricompartment arthritis of the knee with trace effusion. Electronically Signed   By: Luke Bun M.D.   On: 09/29/2024 20:19   DG Knee 1-2 Views Left Result Date: 09/29/2024 CLINICAL DATA:  Fall EXAM: DG KNEE 1-2V*L* COMPARISON:  10/26/2022 FINDINGS: No definitive fracture or malalignment. Mild patellofemoral spurring. Trace knee effusion. Mild chondrocalcinosis. IMPRESSION: No acute osseous abnormality. Trace effusion. Electronically Signed   By: Luke Bun M.D.   On: 09/29/2024 20:16   ECHOCARDIOGRAM COMPLETE Result Date:  09/29/2024    ECHOCARDIOGRAM REPORT   Patient Name:   Deaaron Fulghum Date of Exam: 09/29/2024 Medical Rec #:  [id number]      Height:       72.0 in Accession #:    [id number]     Weight:       182.3 lb Date of Birth:  01-18-1946      BSA:          2.048 m Patient Age:    77 years       BP:           67/48 mmHg Patient Gender: M              HR:           103 bpm. Exam Location:  Inpatient Procedure: 2D Echo, Color Doppler and Cardiac Doppler (Both Spectral and Color             Flow Doppler were utilized during procedure). Indications:    Atrial fibrillation  History:        Patient has no prior history of Echocardiogram examinations.                 Arrythmias:Atrial Fibrillation; Risk Factors:Hypertension,                 Diabetes and Dyslipidemia. CKD.  Sonographer:    Philomena Daring Referring Phys: GARNETTE POUR Lige Lakeman IMPRESSIONS  1. Left ventricular ejection fraction, by estimation, is 70 to 75%. The left ventricle has hyperdynamic function. The left ventricle has no regional wall motion abnormalities. There is severe concentric left ventricular hypertrophy. Left ventricular diastolic function could not be evaluated.  2. Right ventricular systolic function is mildly reduced. The right ventricular size is normal. Tricuspid regurgitation signal is inadequate for assessing PA pressure.  3. Left atrial size was mildly dilated.  4. The mitral valve is grossly normal. Trivial mitral valve regurgitation.  5. The aortic valve is tricuspid. Aortic valve regurgitation is not visualized.  6. The inferior vena cava is normal in size with greater than 50% respiratory variability, suggesting right atrial pressure of 3 mmHg.  7. Rhythm strip during this exam demonstrates atrial fibrillation and with RVR. Comparison(s): No prior Echocardiogram. FINDINGS  Left Ventricle: Left ventricular ejection fraction, by estimation, is 70 to 75%. The left ventricle has hyperdynamic function. The left ventricle has no regional wall motion abnormalities. The left ventricular internal cavity size was small. There is severe concentric left ventricular hypertrophy. Left ventricular diastolic function could not be evaluated due to atrial fibrillation. Left ventricular diastolic function could not be evaluated. Right Ventricle: The right ventricular size is normal. No increase in right ventricular wall thickness. Right ventricular systolic function is mildly reduced. Tricuspid regurgitation signal is inadequate for assessing  PA pressure. Left Atrium: Left atrial size was mildly dilated. Right Atrium: Right atrial size was normal in size. Pericardium: There is no evidence of pericardial effusion. Mitral Valve: The mitral valve is grossly normal. Trivial mitral valve regurgitation. Tricuspid Valve: The tricuspid valve is not well visualized. Tricuspid valve regurgitation is not demonstrated. Aortic Valve: The aortic valve is tricuspid. Aortic valve regurgitation is not visualized. Pulmonic Valve: The pulmonic valve was grossly normal. Pulmonic valve regurgitation is not visualized. Aorta: The aortic root and ascending aorta are structurally normal, with no evidence of dilitation. Venous: The inferior vena cava is normal in size with greater than 50% respiratory variability, suggesting right atrial pressure of 3 mmHg. IAS/Shunts: No atrial level shunt detected  by color flow Doppler. EKG: Rhythm strip during this exam demonstrates atrial fibrillation and with RVR.  LEFT VENTRICLE PLAX 2D LVIDd:         2.60 cm LVIDs:         1.80 cm LV PW:         1.90 cm LV IVS:        1.80 cm LVOT diam:     2.10 cm LV SV:         61 LV SV Index:   30 LVOT Area:     3.46 cm  RIGHT VENTRICLE            IVC RV S prime:     6.85 cm/s  IVC diam: 2.20 cm TAPSE (M-mode): 1.3 cm LEFT ATRIUM             Index        RIGHT ATRIUM           Index LA diam:        3.80 cm 1.86 cm/m   RA Area:     13.40 cm LA Vol (A2C):   44.8 ml 21.87 ml/m  RA Volume:   30.10 ml  14.69 ml/m LA Vol (A4C):   49.3 ml 24.07 ml/m LA Biplane Vol: 48.2 ml 23.53 ml/m  AORTIC VALVE LVOT Vmax:   125.00 cm/s LVOT Vmean:  66.800 cm/s LVOT VTI:    0.176 m  AORTA Ao Root diam: 3.20 cm Ao Asc diam:  3.70 cm MITRAL VALVE MV Area (PHT): 3.85 cm    SHUNTS MV Decel Time: 197 msec    Systemic VTI:  0.18 m MV E velocity: 64.50 cm/s  Systemic Diam: 2.10 cm Vinie Maxcy MD Electronically signed by Vinie Maxcy MD Signature Date/Time: 09/29/2024/3:05:35 PM    Final    CT ABDOMEN PELVIS W  CONTRAST Result Date: 09/28/2024 CLINICAL DATA:  Nausea, vomiting and abdominal pain. EXAM: CT ABDOMEN AND PELVIS WITH CONTRAST TECHNIQUE: Multidetector CT imaging of the abdomen and pelvis was performed using the standard protocol following bolus administration of intravenous contrast. RADIATION DOSE REDUCTION: This exam was performed according to the departmental dose-optimization program which includes automated exposure control, adjustment of the mA and/or kV according to patient size and/or use of iterative reconstruction technique. CONTRAST:  75mL OMNIPAQUE  IOHEXOL  350 MG/ML SOLN COMPARISON:  CT abdomen pelvis [facility] 03/13/2024 FINDINGS: Lower chest: No acute abnormality.  Stable small hiatal hernia. Hepatobiliary: Unremarkable gallbladder. No biliary ductal dilatation. Within the liver, small low-density area near the inferior and anterior tip of the right lobe measures approximately 2 cm in diameter. Although this may represent a small cyst, this did not appear to be present previously and a small area of infection/inflammation cannot be excluded. Pancreas: Unremarkable. No pancreatic ductal dilatation or surrounding inflammatory changes. Spleen: Normal in size without focal abnormality. Adrenals/Urinary Tract: Stable appearance of kidneys without hydronephrosis or calculi. Stable posterior left renal cyst has a benign appearance. The bladder is moderately distended. Stomach/Bowel: Moderate stool in the proximal colon and rectum. No evidence of bowel obstruction, ileus or free air. Status post appendectomy. Vascular/Lymphatic: Atherosclerosis of the abdominal aorta without aneurysm. No lymphadenopathy identified. Reproductive: Prostate is unremarkable. Other: Stable small left inguinal hernia containing fat. No ascites. Musculoskeletal: No acute or significant osseous findings. IMPRESSION: 1. 2 cm low-density area in the inferior and anterior tip of the right lobe of the liver. Although this  may represent a small cyst, this did not appear to be  present previously and a small area of infection/inflammation cannot be excluded. 2. Moderate stool in the proximal colon and rectum. 3. Stable small hiatal hernia. 4. Stable small left inguinal hernia containing fat. 5. Aortic atherosclerosis. Electronically Signed   By: Marcey Moan M.D.   On: 09/28/2024 16:10   CT Head Wo Contrast Result Date: 09/28/2024 CLINICAL DATA:  Altered level of consciousness, nausea and vomiting, weakness EXAM: CT HEAD WITHOUT CONTRAST TECHNIQUE: Contiguous axial images were obtained from the base of the skull through the vertex without intravenous contrast. RADIATION DOSE REDUCTION: This exam was performed according to the departmental dose-optimization program which includes automated exposure control, adjustment of the mA and/or kV according to patient size and/or use of iterative reconstruction technique. COMPARISON:  03/08/2024 FINDINGS: Brain: No acute infarct or hemorrhage. The lateral ventricles and midline structures appear unremarkable. No acute extra-axial fluid collections. No mass effect. Vascular: No hyperdense vessel or unexpected calcification. Skull: Normal. Negative for fracture or focal lesion. Sinuses/Orbits: No acute finding. Other: None. IMPRESSION: 1. Stable head CT, no acute intracranial process. Electronically Signed   By: Ozell Daring M.D.   On: 09/28/2024 16:00   DG Hand 2 View Left Result Date: 09/28/2024 CLINICAL DATA:  Left fifth finger deformity. EXAM: LEFT HAND - 2 VIEW COMPARISON:  None Available. FINDINGS: There is complete dorsal dislocation of the fifth proximal interphalangeal joint. No fracture is noted. IMPRESSION: Complete dorsal dislocation of fifth proximal interphalangeal joint. Electronically Signed   By: Lynwood Landy Raddle M.D.   On: 09/28/2024 14:33   DG Chest Port 1 View Result Date: 09/28/2024 CLINICAL DATA:  Altered mental status, weakness. EXAM: PORTABLE CHEST 1 VIEW  COMPARISON:  October 26, 2022. FINDINGS: The heart size and mediastinal contours are within normal limits. Minimal left basilar subsegmental atelectasis or scarring is noted. New ill-defined nodular density is noted in right upper lobe. The visualized skeletal structures are unremarkable. IMPRESSION: New ill-defined nodular density seen in right upper lobe. CT scan of the chest is recommended for further evaluation. Electronically Signed   By: Lynwood Landy Raddle M.D.   On: 09/28/2024 14:31    Microbiology: Results for orders placed or performed during the hospital encounter of 09/28/24  Blood Culture (routine x 2)     Status: None   Collection Time: 09/28/24  1:41 PM   Specimen: BLOOD RIGHT ARM  Result Value Ref Range Status   Specimen Description BLOOD RIGHT ARM  Final   Special Requests   Final    BOTTLES DRAWN AEROBIC AND ANAEROBIC Blood Culture results may not be optimal due to an inadequate volume of blood received in culture bottles   Culture   Final    NO GROWTH 5 DAYS Performed at [facility], [address], [place], [place] [address]    Report Status 10/03/2024 FINAL  Final  Resp panel by RT-PCR (RSV, Flu A&B, Covid) Anterior Nasal Swab     Status: None   Collection Time: 09/28/24  1:43 PM   Specimen: Anterior Nasal Swab  Result Value Ref Range Status   SARS Coronavirus 2 by RT PCR NEGATIVE NEGATIVE Final   Influenza A by PCR NEGATIVE NEGATIVE Final   Influenza B by PCR NEGATIVE NEGATIVE Final    Comment: (NOTE) The Xpert Xpress SARS-CoV-2/FLU/RSV plus assay is intended as an aid in the diagnosis of influenza from Nasopharyngeal swab specimens and should not be used as a sole basis for treatment. Nasal washings and aspirates are unacceptable for Xpert Xpress  SARS-CoV-2/FLU/RSV testing.  Fact Sheet for Patients: BloggerCourse.com  Fact Sheet for Healthcare Providers: SeriousBroker.it  This test is not yet approved or  cleared by the [place]  FDA and has been authorized for detection and/or diagnosis of SARS-CoV-2 by FDA under an Emergency Use Authorization (EUA). This EUA will remain in effect (meaning this test can be used) for the duration of the COVID-19 declaration under Section 564(b)(1) of the Act, 21 U.S.C. section 360bbb-3(b)(1), unless the authorization is terminated or revoked.     Resp Syncytial Virus by PCR NEGATIVE NEGATIVE Final    Comment: (NOTE) Fact Sheet for Patients: BloggerCourse.com  Fact Sheet for Healthcare Providers: SeriousBroker.it  This test is not yet approved or cleared by the [place]  FDA and has been authorized for detection and/or diagnosis of SARS-CoV-2 by FDA under an Emergency Use Authorization (EUA). This EUA will remain in effect (meaning this test can be used) for the duration of the COVID-19 declaration under Section 564(b)(1) of the Act, 21 U.S.C. section 360bbb-3(b)(1), unless the authorization is terminated or revoked.  Performed at [facility], [address], [place], [place] [address]   Blood Culture (routine x 2)     Status: None   Collection Time: 09/28/24  3:10 PM   Specimen: BLOOD RIGHT ARM  Result Value Ref Range Status   Specimen Description BLOOD RIGHT ARM  Final   Special Requests   Final    BOTTLES DRAWN AEROBIC AND ANAEROBIC Blood Culture results may not be optimal due to an inadequate volume of blood received in culture bottles   Culture   Final    NO GROWTH 5 DAYS Performed at [facility], [address], [place], [place] [address]    Report Status 10/03/2024 FINAL  Final    Labs: CBC: Recent Labs  Lab 09/28/24 1421 09/28/24 2214 09/29/24 0827 09/30/24 0208 10/01/24 0519 10/02/24 0327 10/03/24 0739  WBC 10.8*   < > 6.4 5.0 5.7 6.1 5.7  NEUTROABS 9.0*  --   --   --   --   --   --   HGB 12.8*   < > 13.9 12.1* 11.9* 12.8* 13.3  HCT 38.1*   < > 40.0  34.8* 35.1* 37.7* 39.6  MCV 92.9   < > 90.7 91.8 92.6 92.6 93.6  PLT 252   < > 204 168 158 174 168   < > = values in this interval not displayed.   Basic Metabolic Panel: Recent Labs  Lab 09/28/24 1421 09/28/24 2214 09/29/24 0827 09/30/24 0208 10/01/24 0519 10/03/24 0739  NA 135  --  140 142 139 137  K 3.5  --  2.8* 3.3* 3.5 3.8  CL 103  --  107 110 108 105  CO2 16*  --  22 25 23 24   GLUCOSE 79  --  94 131* 128* 129*  BUN 11  --  12 11 12 16   CREATININE 0.85 0.97 0.92 1.01 1.08 0.82  CALCIUM 8.5*  --  8.8* 8.6* 9.0 9.6  MG  --   --  1.4* 2.2  --  1.4*   Liver Function Tests: Recent Labs  Lab 09/28/24 1421 09/29/24 0827 10/03/24 0739  AST 25 31 26   ALT 14 20 21   ALKPHOS 74 81 69  BILITOT 0.9 1.4* 0.6  PROT 4.3* 5.0* 5.0*  ALBUMIN 2.4* 2.8* 2.6*   CBG: Recent Labs  Lab 10/02/24 1221 10/02/24 1633 10/02/24 2126 10/03/24 0757 10/03/24 1126  GLUCAP 135* 131*  133* 163* 150*    Discharge time spent: less than 30 minutes.  Signed: Garnette Pelt, MD [facility] 10/03/2024

## 2024-10-03 NOTE — Progress Notes (Signed)
 Called son per patient request to meet him at the facility with clothes when he arrives.  Verdie JONETTA Collier, RN

## 2024-10-03 NOTE — TOC Progression Note (Signed)
 Transition of Care [facility]) - Progression Note    Patient Details  Name: Chris Mercer MRN: [id number] Date of Birth: December 24, 1946  [facility]) CM/SW Contact  Isaiah Public, LCSWA Phone Number: 10/03/2024, 8:58 AM  Clinical Narrative:     Patients passr has been approved [id number]. Patient has SNF bed at [facility]. CSW spoke with patients insurance who informed CSW that patients insurance authorization for SNF has been approved. Plan Auth ID# [id number], reference # [id number]. Insurance authorization has been approved from 10/6-10/8 for SNF. Darrien with facility confirmed that patient can dc over today if medically ready. CSW informed MD.  Expected Discharge Plan: Skilled Nursing Facility Barriers to Discharge: Continued Medical Work up               Expected Discharge Plan and Services In-house Referral: Clinical Social Work     Living arrangements for the past 2 months: Apartment                                       Social Drivers of Health (SDOH) Interventions SDOH Screenings   Food Insecurity: No Food Insecurity (09/28/2024)  Housing: Low Risk  (09/28/2024)  Transportation Needs: No Transportation Needs (09/28/2024)  Utilities: Not At Risk (09/28/2024)  Social Connections: Unknown (09/29/2024)  Tobacco Use: Medium Risk (09/28/2024)    Readmission Risk Interventions     No data to display

## 2024-10-03 NOTE — Plan of Care (Signed)
  Problem: Clinical Measurements: Goal: Signs and symptoms of infection will decrease Outcome: Progressing   Problem: Respiratory: Goal: Ability to maintain adequate ventilation will improve Outcome: Progressing   Problem: Clinical Measurements: Goal: Respiratory complications will improve Outcome: Progressing Goal: Cardiovascular complication will be avoided Outcome: Progressing   Problem: Nutrition: Goal: Adequate nutrition will be maintained Outcome: Progressing   Problem: Coping: Goal: Level of anxiety will decrease Outcome: Progressing   Problem: Pain Managment: Goal: General experience of comfort will improve and/or be controlled Outcome: Progressing   Problem: Safety: Goal: Ability to remain free from injury will improve Outcome: Progressing

## 2024-10-03 NOTE — Care Management Important Message (Signed)
 Important Message  Patient Details  Name: Chris Mercer MRN: [id number] Date of Birth: May 20, 1946   Important Message Given:        [name] Sharps 10/03/2024, 11:27 AM

## 2024-10-03 NOTE — Progress Notes (Signed)
 Occupational Therapy Treatment Patient Details Name: Chris Mercer MRN: [id number] DOB: Aug 21, 1946 Today's Date: 10/03/2024   History of present illness Pt is 78 yo presenting [facility] on [facility] due to son calling EMS secondary to fatigue, malaise, nauseas without vomiting, chills. PMH: HTN, DM II, CKD, HLD.   OT comments  Patient seen in order to increase overall functional mobility, assess cognition, and increase ADL independence. Patient wanting to get back in bed, however impulsive with all movement, requiring max cues in order to maintain attention to task and perform safely. Patient min A for transfers. Patient inquiring about L D5 finger, patient stating he fell about a week ago, and now can no longer flex at the DIP joint, DIP joint noticeably enlarged, OT informing RN after session. OT recommendation remains appropriate, will continue to follow.       If plan is discharge home, recommend the following:  A lot of help with bathing/dressing/bathroom;A lot of help with walking and/or transfers;Assistance with cooking/housework;Assist for transportation;Help with stairs or ramp for entrance   Equipment Recommendations  Other (comment) (defer to next venue)    Recommendations for Other Services      Precautions / Restrictions Precautions Precautions: Fall Recall of Precautions/Restrictions: Intact Restrictions Weight Bearing Restrictions Per Provider Order: No       Mobility Bed Mobility Overal bed mobility: Needs Assistance Bed Mobility: Sit to Supine       Sit to supine: Contact guard assist   General bed mobility comments: CGA for line management, getting tangled in lines after impulsively laying down and then wrapping them the opposite direction the OT was completing    Transfers Overall transfer level: Needs assistance Equipment used: Rolling walker (2 wheels) Transfers: Sit to/from Stand, Bed to chair/wheelchair/BSC Sit to Stand: Min assist     Step pivot  transfers: Min assist     General transfer comment: Min A and need for max cues for hand placement, stepping pre-emptively when not fully balanced in standing     Balance Overall balance assessment: Needs assistance Sitting-balance support: Bilateral upper extremity supported, Feet supported Sitting balance-Leahy Scale: Fair     Standing balance support: Bilateral upper extremity supported, During functional activity, Reliant on assistive device for balance Standing balance-Leahy Scale: Poor Standing balance comment: Reliant on external assist                           ADL either performed or assessed with clinical judgement   ADL Overall ADL's : Needs assistance/impaired     Grooming: Set up;Sitting                   Toilet Transfer: Minimal assistance;Stand-pivot;BSC/3in1 Statistician Details (indicate cue type and reason): simulated         Functional mobility during ADLs: Moderate assistance;Cueing for safety;Rolling walker (2 wheels) General ADL Comments: Patient seen in order to increase overall functional mobility, assess cognition, and increase ADL independence. Patient wanting to get back in bed, however impulsive with all movement, requiring max cues in order to maintain attention to task and perform safely. Patient min A for transfers. Patient inquiring about L D5 finger, patient stating he fell about a week ago, and now can no longer flex at the DIP joint, DIP joint noticeably enlarged, OT informing RN after session. OT recommendation remains appropriate, will continue to follow.    Extremity/Trunk Assessment Upper Extremity Assessment Upper Extremity Assessment: LUE deficits/detail LUE Deficits / Details: Patient  inquiring about L D5 finger, patient stating he fell about a week ago, and now can no longer flex at the DIP joint, DIP joint noticeably enlarged, OT informing RN after session. LUE Coordination: decreased fine motor   Lower Extremity  Assessment Lower Extremity Assessment: Defer to PT evaluation        Vision       Perception     Praxis     Communication Communication Communication: No apparent difficulties   Cognition Arousal: Alert Behavior During Therapy: WFL for tasks assessed/performed Cognition: No family/caregiver present to determine baseline             OT - Cognition Comments: Impulsive, at times fully coherent, at other times with nonsensical responses                 Following commands: Impaired Following commands impaired: Follows one step commands with increased time, Follows one step commands inconsistently      Cueing   Cueing Techniques: Verbal cues, Tactile cues  Exercises      Shoulder Instructions       General Comments VSS    Pertinent Vitals/ Pain       Pain Assessment Pain Assessment: No/denies pain  Home Living                                          Prior Functioning/Environment              Frequency  Min 2X/week        Progress Toward Goals  OT Goals(current goals can now be found in the care plan section)  Progress towards OT goals: Progressing toward goals  Acute Rehab OT Goals Patient Stated Goal: to get my finger looked at OT Goal Formulation: With patient Time For Goal Achievement: 10/14/24 Potential to Achieve Goals: Good  Plan      Co-evaluation                 AM-PAC OT 6 Clicks Daily Activity     Outcome Measure   Help from another person eating meals?: None Help from another person taking care of personal grooming?: A Little Help from another person toileting, which includes using toliet, bedpan, or urinal?: A Lot Help from another person bathing (including washing, rinsing, drying)?: A Lot Help from another person to put on and taking off regular upper body clothing?: A Little Help from another person to put on and taking off regular lower body clothing?: A Lot 6 Click Score: 16    End  of Session Equipment Utilized During Treatment: Gait belt;Rolling walker (2 wheels)  OT Visit Diagnosis: Unsteadiness on feet (R26.81);Other abnormalities of gait and mobility (R26.89);Muscle weakness (generalized) (M62.81)   Activity Tolerance Patient tolerated treatment well   Patient Left in bed;with call bell/phone within reach;with bed alarm set   Nurse Communication Mobility status;Other (comment) (L finger)        Time: 8645-8595 OT Time Calculation (min): 10 min  Charges: OT General Charges $OT Visit: 1 Visit OT Treatments $Self Care/Home Management : 8-22 mins  Ronal Gift E. Kota Ciancio, OTR/L Acute Rehabilitation Services [phone]   Ronal Gift Salt 10/03/2024, 2:40 PM

## 2024-10-03 NOTE — TOC Transition Note (Signed)
 Transition of Care [facility]) - Discharge Note   Patient Details  Name: Chris Mercer MRN: [id number] Date of Birth: [birth year]  [facility] CM/SW Contact:  Chris Mercer, LCSWA Phone Number: 10/03/2024, 3:26 PM   Clinical Narrative:     Patient will DC to: Chris Mercer  Anticipated DC date: 10/03/2024  Family notified: Chris Mercer  Transport by: [facility] set up for 5:45pm  ?  Per MD patient ready for DC to [facility] . RN, patient, patient's family, and facility notified of DC. Discharge Summary sent to facility. RN given number for report [phone] RM# [phone]. DC packet on chart. Ambulance transport requested for patient.  CSW signing off.   Final next level of care: Skilled Nursing Facility Barriers to Discharge: No Barriers Identified   Patient Goals and CMS Choice Patient states their goals for this hospitalization and ongoing recovery are:: SNF CMS Medicare.gov Compare Post Acute Care list provided to:: Patient Choice offered to / list presented to : Patient, Adult Children (patient and patients son Chris Mercer)      Discharge Placement              Patient chooses bed at: [facility] Patient to be transferred to facility by: [facility] Name of family member notified: Chris Mercer Patient and family notified of of transfer: 10/03/24  Discharge Plan and Services Additional resources added to the After Visit Summary for   In-house Referral: Clinical Social Work                                   Social Drivers of Health (SDOH) Interventions SDOH Screenings   Food Insecurity: No Food Insecurity (09/28/2024)  Housing: Low Risk  (09/28/2024)  Transportation Needs: No Transportation Needs (09/28/2024)  Utilities: Not At Risk (09/28/2024)  Social Connections: Unknown (09/29/2024)  Tobacco Use: Medium Risk (09/28/2024)     Readmission Risk Interventions     No data to display

## 2024-12-13 ENCOUNTER — Other Ambulatory Visit: Payer: Self-pay | Admitting: Internal Medicine

## 2024-12-13 DIAGNOSIS — I10 Essential (primary) hypertension: Secondary | ICD-10-CM

## 2025-01-02 ENCOUNTER — Other Ambulatory Visit: Payer: Self-pay | Admitting: Internal Medicine

## 2025-01-02 DIAGNOSIS — I10 Essential (primary) hypertension: Secondary | ICD-10-CM

## 2025-01-29 ENCOUNTER — Other Ambulatory Visit: Payer: Self-pay | Admitting: Cardiology

## 2025-02-16 ENCOUNTER — Ambulatory Visit: Admitting: Neurology
